# Patient Record
Sex: Male | Born: 1965 | Race: Black or African American | Hispanic: No | Marital: Married | Smoking: Never smoker
Health system: Southern US, Community
[De-identification: ages and names within clinical notes are randomized; demographics above are authoritative.]

## PROBLEM LIST (undated history)

## (undated) DIAGNOSIS — E785 Hyperlipidemia, unspecified: Secondary | ICD-10-CM

## (undated) DIAGNOSIS — I10 Essential (primary) hypertension: Secondary | ICD-10-CM

---

## 1999-08-19 ENCOUNTER — Encounter: Payer: Self-pay | Admitting: Endocrinology

## 1999-08-19 ENCOUNTER — Emergency Department (HOSPITAL_COMMUNITY): Admission: EM | Admit: 1999-08-19 | Discharge: 1999-08-19 | Payer: Self-pay | Admitting: Endocrinology

## 1999-11-20 ENCOUNTER — Ambulatory Visit (HOSPITAL_COMMUNITY): Admission: RE | Admit: 1999-11-20 | Discharge: 1999-11-20 | Payer: Self-pay | Admitting: Urology

## 2011-07-05 ENCOUNTER — Emergency Department (HOSPITAL_BASED_OUTPATIENT_CLINIC_OR_DEPARTMENT_OTHER)
Admission: EM | Admit: 2011-07-05 | Discharge: 2011-07-05 | Disposition: A | Discharge status: Home / Self Care | Payer: Worker's Compensation | Attending: Emergency Medicine | Admitting: Emergency Medicine

## 2011-07-05 ENCOUNTER — Emergency Department (INDEPENDENT_AMBULATORY_CARE_PROVIDER_SITE_OTHER): Payer: Worker's Compensation

## 2011-07-05 ENCOUNTER — Encounter: Payer: Self-pay | Admitting: *Deleted

## 2011-07-05 DIAGNOSIS — X500XXA Overexertion from strenuous movement or load, initial encounter: Secondary | ICD-10-CM | POA: Insufficient documentation

## 2011-07-05 DIAGNOSIS — M79609 Pain in unspecified limb: Secondary | ICD-10-CM

## 2011-07-05 DIAGNOSIS — X58XXXA Exposure to other specified factors, initial encounter: Secondary | ICD-10-CM

## 2011-07-05 DIAGNOSIS — E785 Hyperlipidemia, unspecified: Secondary | ICD-10-CM | POA: Insufficient documentation

## 2011-07-05 DIAGNOSIS — IMO0002 Reserved for concepts with insufficient information to code with codable children: Secondary | ICD-10-CM

## 2011-07-05 DIAGNOSIS — I10 Essential (primary) hypertension: Secondary | ICD-10-CM | POA: Insufficient documentation

## 2011-07-05 DIAGNOSIS — S8990XA Unspecified injury of unspecified lower leg, initial encounter: Secondary | ICD-10-CM

## 2011-07-05 DIAGNOSIS — S93699A Other sprain of unspecified foot, initial encounter: Secondary | ICD-10-CM | POA: Insufficient documentation

## 2011-07-05 DIAGNOSIS — Y9289 Other specified places as the place of occurrence of the external cause: Secondary | ICD-10-CM | POA: Insufficient documentation

## 2011-07-05 HISTORY — DX: Hyperlipidemia, unspecified: E78.5

## 2011-07-05 HISTORY — DX: Essential (primary) hypertension: I10

## 2011-07-05 MED ORDER — CYCLOBENZAPRINE HCL 10 MG PO TABS: 10.0000 mg | ORAL_TABLET | Freq: Two times a day (BID) | ORAL | Status: AC | PRN

## 2011-07-05 MED ORDER — IBUPROFEN 800 MG PO TABS: 800.0000 mg | ORAL_TABLET | Freq: Three times a day (TID) | ORAL | Status: AC

## 2011-07-05 NOTE — ED Notes (Signed)
Pt sts he was seen at [facility] and UDS was performed at that facility. Pt sts he just needs "an x-ray and evaluated".

## 2011-07-05 NOTE — ED Notes (Signed)
Pt denies offer of needing pain meds. States he is comfortable at this time. Pending radiology results. Will continue to monitor.

## 2011-07-05 NOTE — ED Notes (Signed)
Pt c/o left foot pain after injury this am.

## 2011-07-05 NOTE — ED Provider Notes (Signed)
History     Chief Complaint  Patient presents with  . Foot Injury   Patient is a 45 y.o. male presenting with foot injury. The history is provided by the patient.  Foot Injury  Incident onset: this morning. The incident occurred at work. The injury mechanism was torsion. The pain is present in the left foot (medial foot under 1st MTP). The quality of the pain is described as aching. The pain is moderate. The pain has been constant since onset. Pertinent negatives include no numbness, no inability to bear weight, no loss of motion, no muscle weakness, no loss of sensation and no tingling. The symptoms are aggravated by bearing weight. He has tried nothing for the symptoms.    Past Medical History  Diagnosis Date  . Hyperlipemia   . Hypertension     History reviewed. No pertinent past surgical history.  History reviewed. No pertinent family history.  History  Substance Use Topics  . Smoking status: Never Smoker   . Smokeless tobacco: Not on file  . Alcohol Use: No      Review of Systems  Musculoskeletal:       Positive for foot pain, Negative for swelling, or redness  Skin: Negative for color change.  Neurological: Negative for tingling and numbness.  All other systems reviewed and are negative.    Physical Exam  BP 127/93  Pulse 66  Temp(Src) 97.9 F (36.6 C) (Oral)  Resp 16  Wt 190 lb (86.183 kg)  SpO2 100%  Physical Exam  Constitutional: He is oriented to person, place, and time. He appears well-developed and well-nourished.  HENT:  Head: Normocephalic and atraumatic.  Eyes: Pupils are equal, round, and reactive to light.  Musculoskeletal:       Left ankle: Normal. no tenderness. No lateral malleolus and no medial malleolus tenderness found.       Left foot under 1st MTP TTP. No obvious deformity, erythema or edema. NML cap refill. Full ROM and toes and afoot. Normal tendon function and strength of toes.  Neurological: He is alert and oriented to person,  place, and time.  Skin: Skin is warm and dry. No rash noted. No erythema. No pallor.  Psychiatric: He has a normal mood and affect. His behavior is normal.    ED Course  Procedures  MDM       Thomasene Lot, [place] 07/05/11 2030

## 2011-09-01 NOTE — ED Provider Notes (Signed)
Medical screening examination/treatment/procedure(s) were performed by non-physician practitioner and as supervising physician I was immediately available for consultation/collaboration.  Doug Sou, MD 09/01/11 2025

## 2019-09-27 ENCOUNTER — Emergency Department (HOSPITAL_BASED_OUTPATIENT_CLINIC_OR_DEPARTMENT_OTHER)
Admission: EM | Admit: 2019-09-27 | Discharge: 2019-09-27 | Disposition: A | Discharge status: Home / Self Care | Payer: 59 | Attending: Emergency Medicine | Admitting: Emergency Medicine

## 2019-09-27 ENCOUNTER — Encounter (HOSPITAL_BASED_OUTPATIENT_CLINIC_OR_DEPARTMENT_OTHER): Payer: Self-pay | Admitting: Emergency Medicine

## 2019-09-27 ENCOUNTER — Other Ambulatory Visit: Payer: Self-pay

## 2019-09-27 DIAGNOSIS — F419 Anxiety disorder, unspecified: Secondary | ICD-10-CM | POA: Diagnosis not present

## 2019-09-27 DIAGNOSIS — I10 Essential (primary) hypertension: Secondary | ICD-10-CM | POA: Diagnosis not present

## 2019-09-27 NOTE — ED Provider Notes (Signed)
[facility] EMERGENCY DEPARTMENT Provider Note   CSN: [id number] Arrival date & time: 09/27/19  4098     History   Chief Complaint Chief Complaint  Patient presents with  . Anxiety    HPI Vincent Thompson is a 53 y.o. male.     HPI   53 year old male with what he calls anxiety.  Onset today while in his car driving to work.  Began to feel anxious and lightheaded.  He was able to pull his car over.  When he started driving again he had recurrence of the symptoms and then came to the emergency room.  He states that he has been having these symptoms intermittently over the past weeks to months.  His father did die approximately 3 weeks ago but his symptoms preceded this.  He denies any new medications.  Denies any drug use.  No new over-the-counter medications, supplements or heavy caffeine usage.  He is not sure of what may be driving the symptoms.  Denies any acute pain during this episode or previous ones.  No dyspnea.  No palpitations.  Past Medical History:  Diagnosis Date  . Hyperlipemia   . Hypertension     There are no active problems to display for this patient.   History reviewed. No pertinent surgical history.      Home Medications    Prior to Admission medications   Medication Sig Start Date End Date Taking? Authorizing Provider  amLODipine-benazepril (LOTREL) 5-10 MG per capsule Take 1 capsule by mouth daily.     [provider]  simvastatin (ZOCOR) 10 MG tablet Take 10 mg by mouth every morning.     [provider]    Family History No family history on file.  Social History Social History   Tobacco Use  . Smoking status: Never Smoker  Substance Use Topics  . Alcohol use: No  . Drug use: No     Allergies   Patient has no known allergies.   Review of Systems Review of Systems  All systems reviewed and negative, other than as noted in HPI.  Physical Exam Updated Vital Signs BP (!) 141/97 (BP Location: Right  Arm)   Pulse (!) 106   Temp 99 F (37.2 C) (Oral)   Resp 20   Ht 5\' 10"  (1.778 m)   Wt 88.5 kg   SpO2 98%   BMI 27.98 kg/m   Physical Exam Vitals signs and nursing note reviewed.  Constitutional:      General: He is not in acute distress.    Appearance: He is well-developed.  HENT:     Head: Normocephalic and atraumatic.  Eyes:     General:        Right eye: No discharge.        Left eye: No discharge.     Conjunctiva/sclera: Conjunctivae normal.  Neck:     Musculoskeletal: Neck supple.  Cardiovascular:     Rate and Rhythm: Regular rhythm. Tachycardia present.     Heart sounds: Normal heart sounds. No murmur. No friction rub. No gallop.   Pulmonary:     Effort: Pulmonary effort is normal. No respiratory distress.     Breath sounds: Normal breath sounds.  Abdominal:     General: There is no distension.     Palpations: Abdomen is soft.     Tenderness: There is no abdominal tenderness.  Musculoskeletal:        General: No tenderness.     Comments: Lower extremities symmetric  as compared to each other. No calf tenderness. Negative Homan's. No palpable cords.   Skin:    General: Skin is warm and dry.  Neurological:     Mental Status: He is alert.  Psychiatric:        Behavior: Behavior normal.        Thought Content: Thought content normal.      ED Treatments / Results  Labs (all labs ordered are listed, but only abnormal results are displayed) Labs Reviewed - No data to display  EKG EKG Interpretation  Date/Time:  Monday September 27 2019 09:01:26 EDT Ventricular Rate:  104 PR Interval:    QRS Duration: 92 QT Interval:  340 QTC Calculation: 448 R Axis:   69 Text Interpretation:  Sinus tachycardia Non-specific ST-t changes Confirmed by Raeford Razor [phone]) on 09/27/2019 9:09:46 AM   Radiology No results found.  Procedures Procedures (including critical care time)  Medications Ordered in ED Medications - No data to display   Initial Impression /  Assessment and Plan / ED Course  I have reviewed the triage vital signs and the nursing notes.  Pertinent labs & imaging results that were available during my care of the patient were reviewed by me and considered in my medical decision making (see chart for details).        53 year old male with what he is calling anxiety while driving to work this morning.  Now resolved.  Since symptoms may be from this.  He does seem to have the symptoms more frequently while driving to work in the morning.  He has not noticed any appreciable exacerbating relieving factors otherwise.  He has a mild resting tachycardia of unclear significance.  No pain.  No dyspnea.  No signs or symptoms of DVT.  At this time, I have a low suspicion for emergent underlying etiology.  If his symptoms persist or worsen though I feel that further evaluation by PCP is warranted.  Return precautions were discussed.  Work note for today was provided.  Final Clinical Impressions(s) / ED Diagnoses   Final diagnoses:  Anxiety    ED Discharge Orders    None       Raeford Razor, MD 09/27/19 [phone]

## 2019-09-27 NOTE — ED Triage Notes (Signed)
PT presents with c/o of anxiety for the month but had anxiety attack this am while driving .

## 2020-07-31 ENCOUNTER — Other Ambulatory Visit: Payer: Self-pay

## 2020-07-31 ENCOUNTER — Encounter (HOSPITAL_BASED_OUTPATIENT_CLINIC_OR_DEPARTMENT_OTHER): Payer: Self-pay | Admitting: *Deleted

## 2020-07-31 ENCOUNTER — Emergency Department (HOSPITAL_BASED_OUTPATIENT_CLINIC_OR_DEPARTMENT_OTHER)
Admission: EM | Admit: 2020-07-31 | Discharge: 2020-07-31 | Disposition: A | Discharge status: Home / Self Care | Payer: 59 | Attending: Emergency Medicine | Admitting: Emergency Medicine

## 2020-07-31 ENCOUNTER — Emergency Department (HOSPITAL_BASED_OUTPATIENT_CLINIC_OR_DEPARTMENT_OTHER): Payer: 59

## 2020-07-31 DIAGNOSIS — M25552 Pain in left hip: Secondary | ICD-10-CM | POA: Diagnosis not present

## 2020-07-31 DIAGNOSIS — R0781 Pleurodynia: Secondary | ICD-10-CM | POA: Diagnosis not present

## 2020-07-31 DIAGNOSIS — W11XXXA Fall on and from ladder, initial encounter: Secondary | ICD-10-CM | POA: Insufficient documentation

## 2020-07-31 DIAGNOSIS — Y9289 Other specified places as the place of occurrence of the external cause: Secondary | ICD-10-CM | POA: Insufficient documentation

## 2020-07-31 DIAGNOSIS — Y9389 Activity, other specified: Secondary | ICD-10-CM | POA: Diagnosis not present

## 2020-07-31 DIAGNOSIS — Z79899 Other long term (current) drug therapy: Secondary | ICD-10-CM | POA: Diagnosis not present

## 2020-07-31 DIAGNOSIS — Y998 Other external cause status: Secondary | ICD-10-CM | POA: Insufficient documentation

## 2020-07-31 DIAGNOSIS — I10 Essential (primary) hypertension: Secondary | ICD-10-CM | POA: Diagnosis not present

## 2020-07-31 DIAGNOSIS — W19XXXA Unspecified fall, initial encounter: Secondary | ICD-10-CM

## 2020-07-31 NOTE — Discharge Instructions (Signed)
Your x-ray today did not show any fractures.  Use the incentive spirometer every hour while you are awake as we discussed.  This will help you take deep breaths  You can also buy over-the-counter Salonpas patches.  Apply these to your ribs to help with pain.  10 you Tylenol and ibuprofen as needed for pain.  Take as directed on the bottles.

## 2020-07-31 NOTE — ED Provider Notes (Signed)
[facility] EMERGENCY DEPARTMENT Provider Note   CSN: [id number] Arrival date & time: 07/31/20  1323     History Chief Complaint  Patient presents with   Vincent Thompson is a 54 y.o. male with past medical history sniffing for hyperlipidemia and hypertension.  He is not anticoagulated.  HPI Patient presents to emergency department today with chief complaint of mechanical fall x3 days ago.  Patient states he was standing on a ladder that he thought was well secured to the ledge he was working on.  Unfortunately it was not and he fell.  He estimates falling approximately 8 feet.  He states he landed facedown however denies hitting his head or loss of consciousness.  He was able to get up without assistance and has been ambulatory since the fall without difficulty.  He is endorsing pain on the left lower ribs and left elbow.  He states the pain is dull aching sensation.  He states the pain is constant and is worse with movement.  He rates the pain 8 out of 10 in severity.  He has been taking ibuprofen with symptom improvement.  Denies any pain with taking a deep breath.  He denies any headache, visual changes, neck pain, fever, chills, cough, difficulty breathing, chest pain, back pain, numbness, tingling or weakness.       Past Medical History:  Diagnosis Date   Hyperlipemia    Hypertension     There are no problems to display for this patient.   History reviewed. No pertinent surgical history.     No family history on file.  Social History   Tobacco Use   Smoking status: Never Smoker   Smokeless tobacco: Never Used  Substance Use Topics   Alcohol use: No   Drug use: No    Home Medications Prior to Admission medications   Medication Sig Start Date End Date Taking? Authorizing Provider  amLODipine-benazepril (LOTREL) 5-10 MG per capsule Take 1 capsule by mouth daily.    Yes [provider]  simvastatin (ZOCOR) 10 MG tablet Take 10 mg  by mouth every morning.    Yes [provider]    Allergies    Patient has no known allergies.  Review of Systems   Review of Systems All other systems are reviewed and are negative for acute change except as noted in the HPI.  Physical Exam Updated Vital Signs BP (!) 153/111    Pulse 96    Temp 99.1 F (37.3 C) (Oral)    Resp 18    Ht 5\' 10"  (1.778 m)    Wt 88.5 kg    SpO2 97%    BMI 27.99 kg/m   Physical Exam Vitals and nursing note reviewed.  Constitutional:      General: He is not in acute distress.    Appearance: He is not ill-appearing.  HENT:     Head: Normocephalic and atraumatic. No raccoon eyes or Battle's sign.     Jaw: There is normal jaw occlusion.     Comments: No tenderness to palpation of skull. No deformities or crepitus noted. No open wounds, abrasions or lacerations.     Right Ear: Tympanic membrane and external ear normal. No hemotympanum.     Left Ear: Tympanic membrane and external ear normal. No hemotympanum.     Nose: Nose normal.     Mouth/Throat:     Mouth: Mucous membranes are moist.     Pharynx: Oropharynx is clear.  Eyes:     General: No scleral icterus.       Right eye: No discharge.        Left eye: No discharge.     Extraocular Movements: Extraocular movements intact.     Conjunctiva/sclera: Conjunctivae normal.     Pupils: Pupils are equal, round, and reactive to light.  Neck:     Vascular: No JVD.     Comments: Full ROM intact without spinous process TTP. No bony stepoffs or deformities, no paraspinous muscle TTP or muscle spasms. No rigidity or meningeal signs. No bruising, erythema, or swelling.  Cardiovascular:     Rate and Rhythm: Normal rate and regular rhythm.     Pulses: Normal pulses.          Radial pulses are 2+ on the right side and 2+ on the left side.     Heart sounds: Normal heart sounds.  Pulmonary:     Comments: Lungs clear to auscultation in all fields. Symmetric chest rise. No wheezing, rales, or rhonchi.   Oxygen saturation is 97% on room air. Chest:       Comments: Tenderness palpation as depicted image above.  No overlying skin changes.  No crepitus, deformity, no flail chest.   Abdominal:     Comments: Abdomen is soft, non-distended, and non-tender in all quadrants. No rigidity, no guarding. No peritoneal signs.  Musculoskeletal:        General: Normal range of motion.     Left elbow: Normal.     Left forearm: Normal.     Left wrist: Normal.     Cervical back: Normal and normal range of motion.     Thoracic back: Normal.     Lumbar back: Normal.     Comments: Compartments in left upper extremity are soft.  Radial pulse 2+ bilaterally. Equal grip strength in bilateral upper extremities.  Full ROM of left shoulder, elbow, and wrist. Palpated from head to toe without any bony tenderness. Pelvis is stable.  Skin:    General: Skin is warm and dry.     Capillary Refill: Capillary refill takes less than 2 seconds.  Neurological:     Mental Status: He is oriented to person, place, and time.     GCS: GCS eye subscore is 4. GCS verbal subscore is 5. GCS motor subscore is 6.     Comments: Fluent speech, no facial droop.  Psychiatric:        Behavior: Behavior normal.       ED Results / Procedures / Treatments   Labs (all labs ordered are listed, but only abnormal results are displayed) Labs Reviewed - No data to display  EKG None  Radiology DG Ribs Unilateral W/Chest Left  Result Date: 07/31/2020 CLINICAL DATA:  Left lower anterior rib pain status post fall. EXAM: LEFT RIBS AND CHEST - 3+ VIEW COMPARISON:  None. FINDINGS: No fracture or other bone lesions are seen involving the ribs. There is no evidence of pneumothorax or pleural effusion. Both lungs are clear. Heart size and mediastinal contours are within normal limits. IMPRESSION: Negative. Electronically Signed   By: Elige Ko   On: 07/31/2020 14:31   DG Elbow Complete Left  Result Date: 07/31/2020 CLINICAL DATA:  Left  elbow injury in a fall off a ledge which broke today. Initial encounter. EXAM: LEFT ELBOW - COMPLETE 3+ VIEW COMPARISON:  None. FINDINGS: There is no evidence of fracture, dislocation, or joint effusion. There is no evidence of arthropathy or other focal  bone abnormality. Soft tissues are unremarkable. IMPRESSION: Negative exam. Electronically Signed   By: Drusilla Kanner M.D.   On: 07/31/2020 14:30    Procedures Procedures (including critical care time)  Medications Ordered in ED Medications - No data to display  ED Course  I have reviewed the triage vital signs and the nursing notes.  Pertinent labs & imaging results that were available during my care of the patient were reviewed by me and considered in my medical decision making (see chart for details).    MDM Rules/Calculators/A&P                          History provided by patient with additional history obtained from chart review.     54 yo male presents after mechanical fall x3 days ago. Head to toe evaluation shows no hematoma, bleeding of the scalp, no facial abrasions, no spine step offs, crepitus of the chest or neck, no tenderness to palpation of the bilateral upper and lower extremities, no gross deformities. no pelvic pain.  He does have tenderness palpation of anterior left lower ribs.  No crepitus, deformity or flail chest.  Lung sounds heard in all fields.  Oxygen saturation 97% room air.  Left upper extremity including elbow also unremarkable on exam. X-ray of chest with left ribs and of left elbow viewed by me not show any fractures or dislocations.  No obvious x-ray of ribs seen.  Discussed possibility of occult fracture with patient.  Given incentive spirometer and instructed on use every hour while awake to encourage deep breathing.  Also recommend over-the-counter lidocaine patches to help with pain.  Given reassuring exam do not feel further imaging is needed.  Engaged in shared decision making and patient  agrees.  The patient appears reasonably screened and/or stabilized for discharge and I doubt any other medical condition or other [facility] requiring further screening, evaluation, or treatment in the ED at this time prior to discharge. The patient is safe for discharge with strict return precautions discussed. Recommend pcp follow up if pain persists.   Portions of this note were generated with Scientist, clinical (histocompatibility and immunogenetics). Dictation errors may occur despite best attempts at proofreading.    Final Clinical Impression(s) / ED Diagnoses Final diagnoses:  Fall, initial encounter    Rx / DC Orders ED Discharge Orders    None       Sherene Sires, PA-C 07/31/20 1611    Milagros Loll, MD 08/04/20 1441

## 2020-07-31 NOTE — ED Notes (Signed)
Pt provided instructions on incentive spirometry use with return demonstration.

## 2020-07-31 NOTE — ED Triage Notes (Signed)
Left ribs and left elbow injury. He was cleaning while standing on a ledge and the ledge broke.

## 2021-02-08 IMAGING — CR DG RIBS W/ CHEST 3+V*L*
4 series · 4 of 4 positions shown · non-contrast
Comparison: None.

CLINICAL DATA: Left lower anterior rib pain status post fall.

EXAM:
LEFT RIBS AND CHEST - 3+ VIEW

[w chest pa]
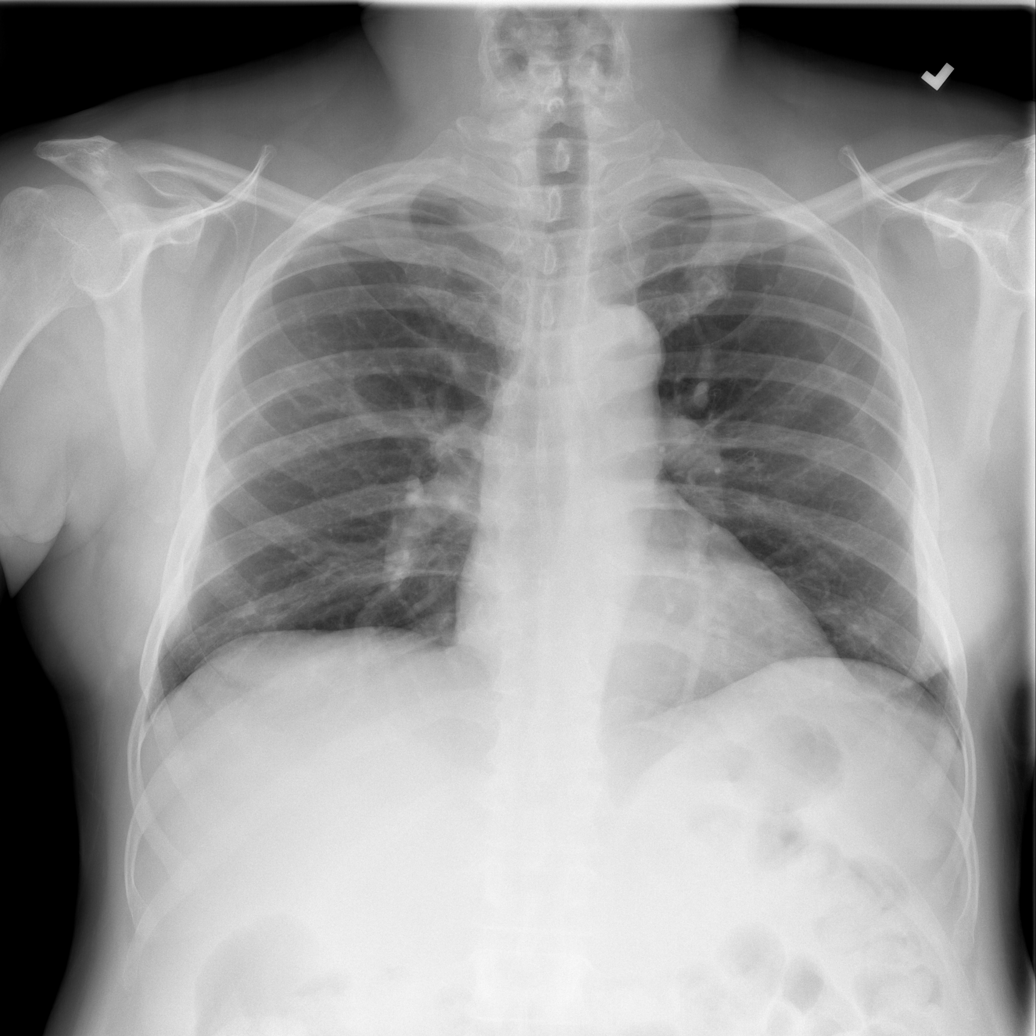

[w ribs ap/pa upper left]
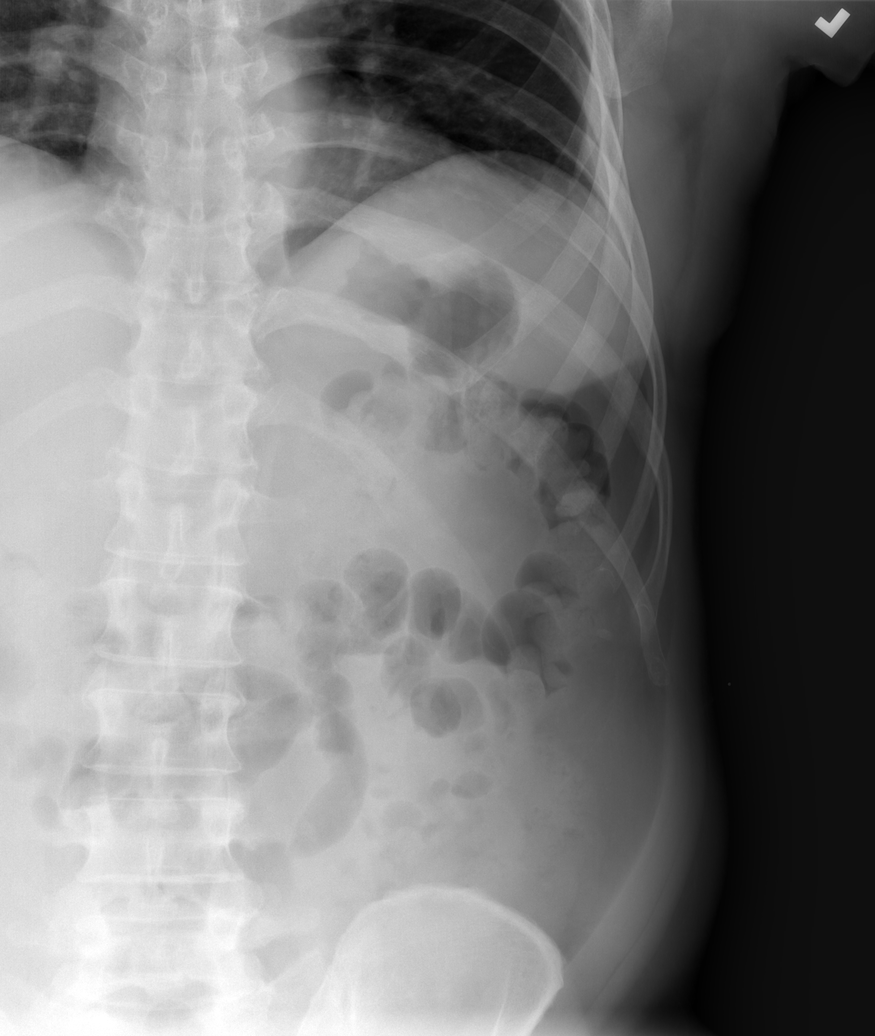

[w ribs oblique left (1 of 2)]
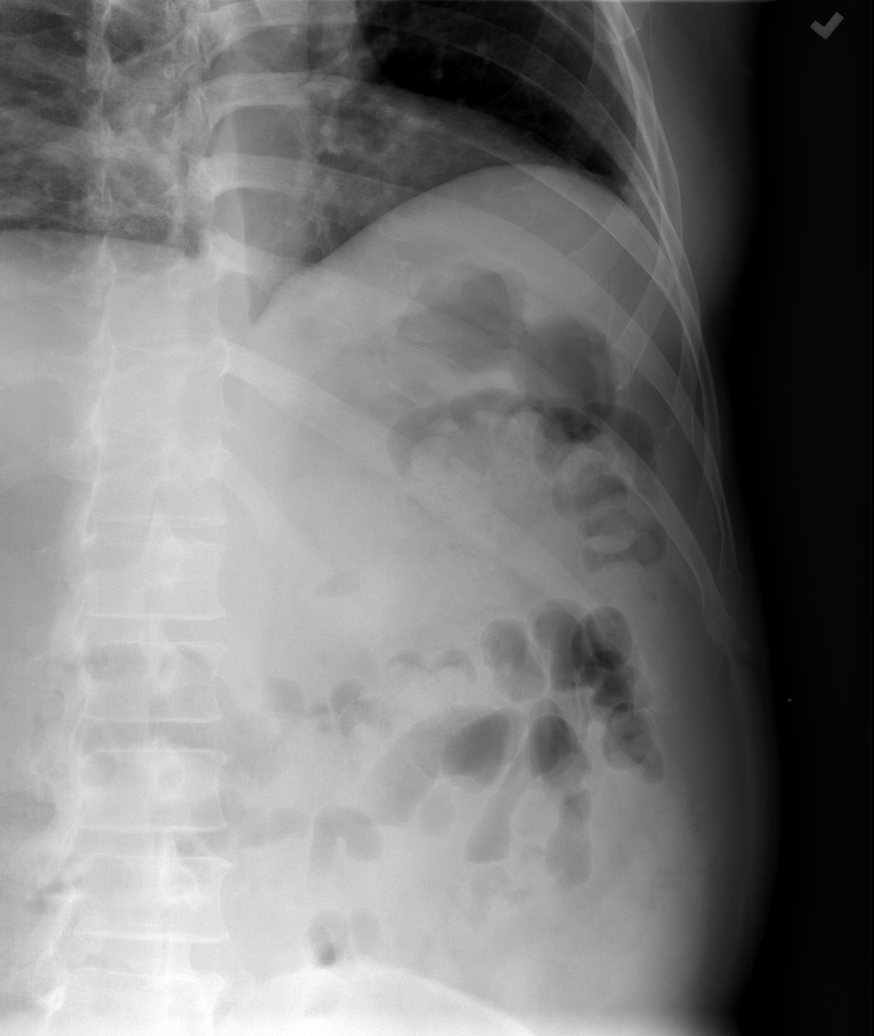

[w ribs oblique left (2 of 2)]
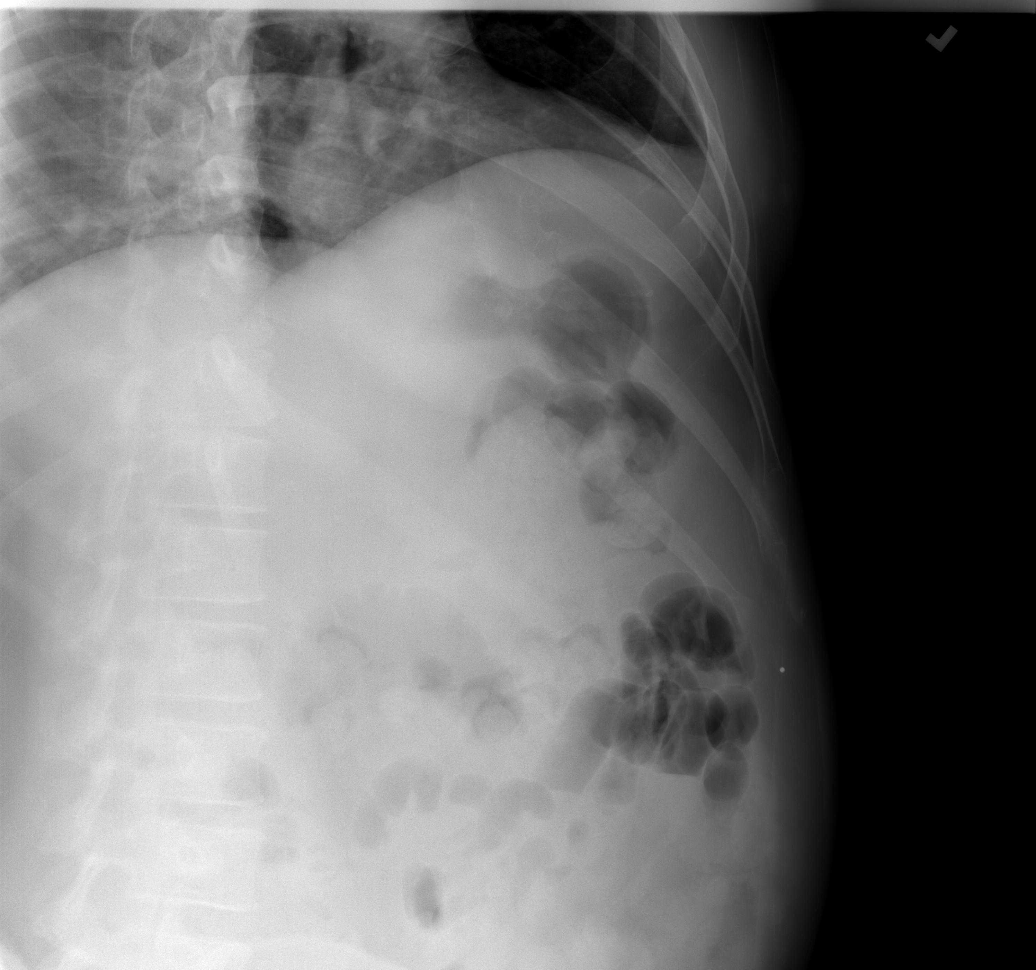

[4 of 4 positions shown; findings below may reference images not displayed]

FINDINGS: No fracture or other bone lesions are seen involving the ribs. There
is no evidence of pneumothorax or pleural effusion. Both lungs are
clear. Heart size and mediastinal contours are within normal limits.
IMPRESSION: Negative.

## 2023-01-12 ENCOUNTER — Inpatient Hospital Stay (HOSPITAL_BASED_OUTPATIENT_CLINIC_OR_DEPARTMENT_OTHER)
Admission: EM | Admit: 2023-01-12 | Discharge: 2023-01-22 | DRG: 637 | Disposition: A | Discharge status: Home / Self Care | Payer: 59 | Attending: Internal Medicine | Admitting: Internal Medicine

## 2023-01-12 ENCOUNTER — Emergency Department (HOSPITAL_BASED_OUTPATIENT_CLINIC_OR_DEPARTMENT_OTHER): Payer: 59

## 2023-01-12 ENCOUNTER — Encounter (HOSPITAL_BASED_OUTPATIENT_CLINIC_OR_DEPARTMENT_OTHER): Payer: Self-pay | Admitting: Emergency Medicine

## 2023-01-12 ENCOUNTER — Other Ambulatory Visit: Payer: Self-pay

## 2023-01-12 ENCOUNTER — Encounter (HOSPITAL_COMMUNITY): Payer: Self-pay

## 2023-01-12 DIAGNOSIS — R4182 Altered mental status, unspecified: Secondary | ICD-10-CM | POA: Diagnosis present

## 2023-01-12 DIAGNOSIS — R6510 Systemic inflammatory response syndrome (SIRS) of non-infectious origin without acute organ dysfunction: Secondary | ICD-10-CM | POA: Diagnosis present

## 2023-01-12 DIAGNOSIS — I10 Essential (primary) hypertension: Secondary | ICD-10-CM | POA: Diagnosis present

## 2023-01-12 DIAGNOSIS — F10231 Alcohol dependence with withdrawal delirium: Secondary | ICD-10-CM | POA: Diagnosis present

## 2023-01-12 DIAGNOSIS — E861 Hypovolemia: Secondary | ICD-10-CM | POA: Diagnosis present

## 2023-01-12 DIAGNOSIS — D539 Nutritional anemia, unspecified: Secondary | ICD-10-CM | POA: Diagnosis present

## 2023-01-12 DIAGNOSIS — E785 Hyperlipidemia, unspecified: Secondary | ICD-10-CM | POA: Diagnosis present

## 2023-01-12 DIAGNOSIS — K567 Ileus, unspecified: Secondary | ICD-10-CM | POA: Diagnosis present

## 2023-01-12 DIAGNOSIS — N179 Acute kidney failure, unspecified: Secondary | ICD-10-CM | POA: Diagnosis not present

## 2023-01-12 DIAGNOSIS — K7010 Alcoholic hepatitis without ascites: Secondary | ICD-10-CM | POA: Diagnosis present

## 2023-01-12 DIAGNOSIS — D696 Thrombocytopenia, unspecified: Secondary | ICD-10-CM | POA: Diagnosis not present

## 2023-01-12 DIAGNOSIS — K700 Alcoholic fatty liver: Secondary | ICD-10-CM | POA: Diagnosis present

## 2023-01-12 DIAGNOSIS — R000 Tachycardia, unspecified: Secondary | ICD-10-CM | POA: Diagnosis present

## 2023-01-12 DIAGNOSIS — F419 Anxiety disorder, unspecified: Secondary | ICD-10-CM | POA: Diagnosis present

## 2023-01-12 DIAGNOSIS — E871 Hypo-osmolality and hyponatremia: Secondary | ICD-10-CM | POA: Diagnosis present

## 2023-01-12 DIAGNOSIS — G9341 Metabolic encephalopathy: Secondary | ICD-10-CM | POA: Diagnosis present

## 2023-01-12 DIAGNOSIS — E870 Hyperosmolality and hypernatremia: Secondary | ICD-10-CM | POA: Diagnosis present

## 2023-01-12 DIAGNOSIS — N281 Cyst of kidney, acquired: Secondary | ICD-10-CM | POA: Diagnosis present

## 2023-01-12 DIAGNOSIS — K2980 Duodenitis without bleeding: Secondary | ICD-10-CM | POA: Diagnosis present

## 2023-01-12 DIAGNOSIS — G312 Degeneration of nervous system due to alcohol: Secondary | ICD-10-CM | POA: Diagnosis present

## 2023-01-12 DIAGNOSIS — E860 Dehydration: Secondary | ICD-10-CM | POA: Diagnosis present

## 2023-01-12 DIAGNOSIS — M6282 Rhabdomyolysis: Secondary | ICD-10-CM | POA: Diagnosis not present

## 2023-01-12 DIAGNOSIS — K859 Acute pancreatitis without necrosis or infection, unspecified: Secondary | ICD-10-CM | POA: Diagnosis not present

## 2023-01-12 DIAGNOSIS — K56600 Partial intestinal obstruction, unspecified as to cause: Secondary | ICD-10-CM | POA: Diagnosis present

## 2023-01-12 DIAGNOSIS — K858 Other acute pancreatitis without necrosis or infection: Secondary | ICD-10-CM | POA: Diagnosis present

## 2023-01-12 DIAGNOSIS — E1110 Type 2 diabetes mellitus with ketoacidosis without coma: Principal | ICD-10-CM | POA: Diagnosis present

## 2023-01-12 DIAGNOSIS — R410 Disorientation, unspecified: Secondary | ICD-10-CM

## 2023-01-12 DIAGNOSIS — Z79899 Other long term (current) drug therapy: Secondary | ICD-10-CM

## 2023-01-12 DIAGNOSIS — J069 Acute upper respiratory infection, unspecified: Secondary | ICD-10-CM | POA: Diagnosis present

## 2023-01-12 DIAGNOSIS — R7989 Other specified abnormal findings of blood chemistry: Secondary | ICD-10-CM | POA: Diagnosis not present

## 2023-01-12 DIAGNOSIS — E876 Hypokalemia: Secondary | ICD-10-CM | POA: Diagnosis present

## 2023-01-12 DIAGNOSIS — Z1152 Encounter for screening for COVID-19: Secondary | ICD-10-CM

## 2023-01-12 DIAGNOSIS — G4733 Obstructive sleep apnea (adult) (pediatric): Secondary | ICD-10-CM | POA: Diagnosis present

## 2023-01-12 DIAGNOSIS — Z833 Family history of diabetes mellitus: Secondary | ICD-10-CM

## 2023-01-12 LAB — COMPREHENSIVE METABOLIC PANEL
ALT: 96 U/L — ABNORMAL HIGH (ref 0–44)
AST: 66 U/L — ABNORMAL HIGH (ref 15–41)
Albumin: 4.6 g/dL (ref 3.5–5.0)
Alkaline Phosphatase: 76 U/L (ref 38–126)
Anion gap: 33 — ABNORMAL HIGH (ref 5–15)
BUN: 90 mg/dL — ABNORMAL HIGH (ref 6–20)
CO2: 13 mmol/L — ABNORMAL LOW (ref 22–32)
Calcium: 12.2 mg/dL — ABNORMAL HIGH (ref 8.9–10.3)
Chloride: 87 mmol/L — ABNORMAL LOW (ref 98–111)
Creatinine, Ser: 3.61 mg/dL — ABNORMAL HIGH (ref 0.61–1.24)
GFR, Estimated: 19 mL/min — ABNORMAL LOW (ref >60)
Glucose, Bld: >1200 mg/dL (ref 70–99)
Potassium: 5.4 mmol/L — ABNORMAL HIGH (ref 3.5–5.1)
Sodium: 133 mmol/L — ABNORMAL LOW (ref 135–145)
Total Bilirubin: 1.8 mg/dL — ABNORMAL HIGH (ref 0.3–1.2)
Total Protein: 9.7 g/dL — ABNORMAL HIGH (ref 6.5–8.1)

## 2023-01-12 LAB — I-STAT ARTERIAL BLOOD GAS, ED
Acid-base deficit: 9 mmol/L — ABNORMAL HIGH (ref 0.0–2.0)
Bicarbonate: 16.2 mmol/L — ABNORMAL LOW (ref 20.0–28.0)
Calcium, Ion: 1.56 mmol/L (ref 1.15–1.40)
HCT: 47 % (ref 39.0–52.0)
Hemoglobin: 16 g/dL (ref 13.0–17.0)
O2 Saturation: 92 %
Patient temperature: 99.1
Potassium: 4.4 mmol/L (ref 3.5–5.1)
Sodium: 137 mmol/L (ref 135–145)
TCO2: 17 mmol/L — ABNORMAL LOW (ref 22–32)
pCO2 arterial: 32.8 mmHg (ref 32–48)
pH, Arterial: 7.303 — ABNORMAL LOW (ref 7.35–7.45)
pO2, Arterial: 70 mmHg — ABNORMAL LOW (ref 83–108)

## 2023-01-12 LAB — URINALYSIS, MICROSCOPIC (REFLEX)

## 2023-01-12 LAB — I-STAT VENOUS BLOOD GAS, ED
Acid-base deficit: 11 mmol/L — ABNORMAL HIGH (ref 0.0–2.0)
Bicarbonate: 14.7 mmol/L — ABNORMAL LOW (ref 20.0–28.0)
Calcium, Ion: 1.43 mmol/L — ABNORMAL HIGH (ref 1.15–1.40)
HCT: 53 % — ABNORMAL HIGH (ref 39.0–52.0)
Hemoglobin: 18 g/dL — ABNORMAL HIGH (ref 13.0–17.0)
O2 Saturation: 75 %
Patient temperature: 100.2
Potassium: 5.4 mmol/L — ABNORMAL HIGH (ref 3.5–5.1)
Sodium: 134 mmol/L — ABNORMAL LOW (ref 135–145)
TCO2: 16 mmol/L — ABNORMAL LOW (ref 22–32)
pCO2, Ven: 32.7 mmHg — ABNORMAL LOW (ref 44–60)
pH, Ven: 7.264 (ref 7.25–7.43)
pO2, Ven: 48 mmHg — ABNORMAL HIGH (ref 32–45)

## 2023-01-12 LAB — CBG MONITORING, ED
Glucose-Capillary: >600 mg/dL (ref 70–99)
Glucose-Capillary: >600 mg/dL (ref 70–99)
Glucose-Capillary: >600 mg/dL (ref 70–99)
Glucose-Capillary: >600 mg/dL (ref 70–99)
Glucose-Capillary: >600 mg/dL (ref 70–99)
Glucose-Capillary: >600 mg/dL (ref 70–99)
Glucose-Capillary: >600 mg/dL (ref 70–99)
Glucose-Capillary: >600 mg/dL (ref 70–99)

## 2023-01-12 LAB — CBC WITH DIFFERENTIAL/PLATELET
Abs Immature Granulocytes: 0.15 10*3/uL — ABNORMAL HIGH (ref 0.00–0.07)
Basophils Absolute: 0.1 10*3/uL (ref 0.0–0.1)
Basophils Relative: 0 %
Eosinophils Absolute: 0 10*3/uL (ref 0.0–0.5)
Eosinophils Relative: 0 %
HCT: 53.4 % — ABNORMAL HIGH (ref 39.0–52.0)
Hemoglobin: 17 g/dL (ref 13.0–17.0)
Immature Granulocytes: 1 %
Lymphocytes Relative: 3 %
Lymphs Abs: 0.5 10*3/uL — ABNORMAL LOW (ref 0.7–4.0)
MCH: 31.5 pg (ref 26.0–34.0)
MCHC: 31.8 g/dL (ref 30.0–36.0)
MCV: 99.1 fL (ref 80.0–100.0)
Monocytes Absolute: 1.2 10*3/uL — ABNORMAL HIGH (ref 0.1–1.0)
Monocytes Relative: 6 %
Neutro Abs: 16.8 10*3/uL — ABNORMAL HIGH (ref 1.7–7.7)
Neutrophils Relative %: 90 %
Platelets: 325 10*3/uL (ref 150–400)
RBC: 5.39 MIL/uL (ref 4.22–5.81)
RDW: 12.6 % (ref 11.5–15.5)
WBC: 18.7 10*3/uL — ABNORMAL HIGH (ref 4.0–10.5)
nRBC: 0 % (ref 0.0–0.2)

## 2023-01-12 LAB — BASIC METABOLIC PANEL
Anion gap: 30 — ABNORMAL HIGH (ref 5–15)
Anion gap: 18 — ABNORMAL HIGH (ref 5–15)
BUN: 88 mg/dL — ABNORMAL HIGH (ref 6–20)
BUN: 76 mg/dL — ABNORMAL HIGH (ref 6–20)
CO2: 14 mmol/L — ABNORMAL LOW (ref 22–32)
CO2: 21 mmol/L — ABNORMAL LOW (ref 22–32)
Calcium: 11.3 mg/dL — ABNORMAL HIGH (ref 8.9–10.3)
Calcium: 11.6 mg/dL — ABNORMAL HIGH (ref 8.9–10.3)
Chloride: 91 mmol/L — ABNORMAL LOW (ref 98–111)
Chloride: 104 mmol/L (ref 98–111)
Creatinine, Ser: 3.37 mg/dL — ABNORMAL HIGH (ref 0.61–1.24)
Creatinine, Ser: 2.61 mg/dL — ABNORMAL HIGH (ref 0.61–1.24)
GFR, Estimated: 20 mL/min — ABNORMAL LOW (ref >60)
GFR, Estimated: 28 mL/min — ABNORMAL LOW (ref >60)
Glucose, Bld: >1200 mg/dL (ref 70–99)
Glucose, Bld: 955 mg/dL (ref 70–99)
Potassium: 6 mmol/L — ABNORMAL HIGH (ref 3.5–5.1)
Potassium: 4 mmol/L (ref 3.5–5.1)
Sodium: 135 mmol/L (ref 135–145)
Sodium: 143 mmol/L (ref 135–145)

## 2023-01-12 LAB — RAPID URINE DRUG SCREEN, HOSP PERFORMED
Amphetamines: NOT DETECTED
Barbiturates: NOT DETECTED
Benzodiazepines: NOT DETECTED
Cocaine: NOT DETECTED
Opiates: NOT DETECTED
Tetrahydrocannabinol: NOT DETECTED

## 2023-01-12 LAB — URINALYSIS, ROUTINE W REFLEX MICROSCOPIC
Bilirubin Urine: NEGATIVE
Glucose, UA: >=500 mg/dL — AB
Ketones, ur: 40 mg/dL — AB
Leukocytes,Ua: NEGATIVE
Nitrite: NEGATIVE
Protein, ur: NEGATIVE mg/dL
Specific Gravity, Urine: 1.01 (ref 1.005–1.030)
pH: 5 (ref 5.0–8.0)

## 2023-01-12 LAB — LACTIC ACID, PLASMA
Lactic Acid, Venous: 5.9 mmol/L (ref 0.5–1.9)
Lactic Acid, Venous: 6.8 mmol/L (ref 0.5–1.9)
Lactic Acid, Venous: 2.5 mmol/L (ref 0.5–1.9)

## 2023-01-12 LAB — RESP PANEL BY RT-PCR (RSV, FLU A&B, COVID)  RVPGX2
Influenza A by PCR: NEGATIVE
Influenza B by PCR: NEGATIVE
Resp Syncytial Virus by PCR: NEGATIVE
SARS Coronavirus 2 by RT PCR: NEGATIVE

## 2023-01-12 LAB — LIPASE, BLOOD: Lipase: 1194 U/L — ABNORMAL HIGH (ref 11–51)

## 2023-01-12 LAB — PROTIME-INR
INR: 1.3 — ABNORMAL HIGH (ref 0.8–1.2)
Prothrombin Time: 15.9 seconds — ABNORMAL HIGH (ref 11.4–15.2)

## 2023-01-12 LAB — GLUCOSE, CAPILLARY: Glucose-Capillary: >600 mg/dL (ref 70–99)

## 2023-01-12 LAB — APTT: aPTT: 28 seconds (ref 24–36)

## 2023-01-12 LAB — BETA-HYDROXYBUTYRIC ACID: Beta-Hydroxybutyric Acid: >8 mmol/L — ABNORMAL HIGH (ref 0.05–0.27)

## 2023-01-12 MED ORDER — VANCOMYCIN VARIABLE DOSE PER UNSTABLE RENAL FUNCTION (PHARMACIST DOSING)
Status: DC
  Filled 2023-01-12: qty 1

## 2023-01-12 MED ORDER — LACTATED RINGERS IV SOLN: INTRAVENOUS | Status: DC

## 2023-01-12 MED ORDER — DEXTROSE 50 % IV SOLN: 0.0000 mL | INTRAVENOUS | Status: DC | PRN

## 2023-01-12 MED ORDER — SODIUM CHLORIDE 0.9 % IV SOLN: INTRAVENOUS | Status: DC

## 2023-01-12 MED ORDER — INSULIN REGULAR(HUMAN) IN NACL 100-0.9 UT/100ML-% IV SOLN
INTRAVENOUS | Status: DC
  Administered 2023-01-13: 12 [IU]/h via INTRAVENOUS
  Administered 2023-01-13: 9 [IU]/h via INTRAVENOUS
  Administered 2023-01-13: 8.5 [IU]/h via INTRAVENOUS
  Administered 2023-01-14: 21 [IU]/h via INTRAVENOUS
  Administered 2023-01-14: 8.5 [IU]/h via INTRAVENOUS
  Administered 2023-01-15: 2.4 [IU]/h via INTRAVENOUS
  Administered 2023-01-16 (×2): 4.6 [IU]/h via INTRAVENOUS
  Administered 2023-01-18: 1.9 [IU]/h via INTRAVENOUS
  Filled 2023-01-12 (×9): qty 100

## 2023-01-12 MED ORDER — METRONIDAZOLE 500 MG/100ML IV SOLN
500.0000 mg | Freq: Two times a day (BID) | INTRAVENOUS | Status: DC
  Administered 2023-01-13 – 2023-01-15 (×5): 500 mg via INTRAVENOUS
  Filled 2023-01-12 (×5): qty 100

## 2023-01-12 MED ORDER — SODIUM CHLORIDE 0.9 % IV BOLUS
1000.0000 mL | Freq: Once | INTRAVENOUS | Status: AC
  Administered 2023-01-12: 1000 mL via INTRAVENOUS

## 2023-01-12 MED ORDER — VANCOMYCIN HCL IN DEXTROSE 1-5 GM/200ML-% IV SOLN
1000.0000 mg | Freq: Once | INTRAVENOUS | Status: AC
  Administered 2023-01-12: 1000 mg via INTRAVENOUS
  Filled 2023-01-12: qty 200

## 2023-01-12 MED ORDER — LACTATED RINGERS IV BOLUS (SEPSIS)
1000.0000 mL | Freq: Once | INTRAVENOUS | Status: AC
  Administered 2023-01-12: 1000 mL via INTRAVENOUS

## 2023-01-12 MED ORDER — PIPERACILLIN-TAZOBACTAM 3.375 G IVPB 30 MIN
3.3750 g | Freq: Once | INTRAVENOUS | Status: AC
  Administered 2023-01-12: 3.375 g via INTRAVENOUS
  Filled 2023-01-12: qty 50

## 2023-01-12 MED ORDER — INSULIN REGULAR(HUMAN) IN NACL 100-0.9 UT/100ML-% IV SOLN
INTRAVENOUS | Status: DC
  Administered 2023-01-12: 4.6 [IU]/h via INTRAVENOUS
  Filled 2023-01-12: qty 100

## 2023-01-12 MED ORDER — ORAL CARE MOUTH RINSE
15.0000 mL | OROMUCOSAL | Status: DC | PRN
  Filled 2023-01-12: qty 15

## 2023-01-12 MED ORDER — VANCOMYCIN HCL 500 MG IV SOLR
500.0000 mg | Freq: Once | INTRAVENOUS | Status: AC
  Administered 2023-01-12: 500 mg via INTRAVENOUS

## 2023-01-12 MED ORDER — DEXTROSE IN LACTATED RINGERS 5 % IV SOLN: INTRAVENOUS | Status: DC

## 2023-01-12 MED ORDER — LACTATED RINGERS IV BOLUS
1000.0000 mL | Freq: Once | INTRAVENOUS | Status: AC
  Administered 2023-01-12: 1000 mL via INTRAVENOUS

## 2023-01-12 MED ORDER — CHLORHEXIDINE GLUCONATE CLOTH 2 % EX PADS
6.0000 | MEDICATED_PAD | Freq: Every day | CUTANEOUS | Status: DC
  Administered 2023-01-12 – 2023-01-22 (×11): 6 via TOPICAL
  Filled 2023-01-12: qty 6

## 2023-01-12 NOTE — ED Notes (Signed)
ED Provider at bedside. 

## 2023-01-12 NOTE — ED Notes (Signed)
ABG attempt left radial unsuccessful, patient taken to CT will attempt when returned to room.

## 2023-01-12 NOTE — ED Notes (Signed)
Called spouse, Berenice Primas, and updated that patient had left with [facility] and was on the way to [employer]

## 2023-01-12 NOTE — H&P (Addendum)
Vincent Thompson YDX:[id number] DOB: [birth year] DOA: 01/12/2023     PCP: Loyal Jacobson, MD   Outpatient Specialists:  NONE     Patient arrived to ER on 01/12/23 at 1402 Referred by Attending Therisa Doyne, MD   Patient coming from:    home Lives    With family    Chief Complaint:   Chief Complaint  Patient presents with   Altered Mental Status    HPI: Vincent Thompson is a 57 y.o. male with medical history significant of HTN, elevated LFT, anxiety, HLD    Presented with   confusion and lethargy Reports cough and URI for 2 wks Today lethargic, tested negative for COVID 1 wk ago endorses confusion. At first he was getting a bit better yesterday but then started to get again more lethargic and tired.  That is what triggered him to go to emergency department.  Otherwise wife is unsure about any abdominal pain or dysuria he still has some cough.  Found to have severely elevated blood sugar and lipase Patient was not aware that he is diabetic  Wife states he stopped urinating but before that he was drinking a lot of fluids He drinks on occasion 3-5 drinks a week  Does not smoke He was told that he is prediabetic Family hx of DM in mom and sister   He has been drinking a lot of orange juice trying to self treat with vitamin C   Initial COVID TEST  NEGATIVE   Lab Results  Component Value Date   SARSCOV2NAA NEGATIVE 01/12/2023     Regarding pertinent Chronic problems:     Hyperlipidemia - on Zocor   HTN on amlodipine/benazepril and hydrochlorothiazide      OSA - noncompliant with CPAP  While in ER: Clinical Course as of 01/12/23 2329  Sun Jan 12, 2023  1528 Endotool ordered. Appears to have new onset diabetes, presenting with severe DKA and altered mental status. New AKI. Continuing sepsis work up. Likely also septic.  [LA]  1721 Spoke with Dr. Merrily Pew, critical care who requests hospitalist admission  [LA]  1736 Spoke with Dr. Sharl Ma, hospitalist who will  admit patient to step down  unit.  [LA]    Clinical Course User Index [LA] Cristopher Peru, PA-C     CT HEAD   NON acute  CXR -  NON acute  CTabd/pelvis - Mild stranding/edema around the pancreas, concerning for pancreatitis. No discrete, drainable fluid collection. Correlate with lipase.  Mild thickening of the proximal duodenum in the region of the pancreas, likely secondary/reactive change. Hepatic steatosis.    Indeterminate 2.6 cm left renal lesion. This may represent a cyst but recommend non-urgent follow-up renal ultrasound to better characterize. CT  chest - Lungs are clear. No pleural effusion or pneumothorax.   Following Medications were ordered in ER: Medications  insulin regular, human (MYXREDLIN) 100 units/ 100 mL infusion (4.6 Units/hr Intravenous Rate/Dose Change 01/12/23 2312)  lactated ringers infusion ( Intravenous Infusion Verify 01/12/23 2314)  dextrose 5 % in lactated ringers infusion (has no administration in time range)  dextrose 50 % solution 0-50 mL (has no administration in time range)  vancomycin variable dose per unstable renal function (pharmacist dosing) (has no administration in time range)  Chlorhexidine Gluconate Cloth 2 % PADS 6 each (6 each Topical Given 01/12/23 2313)  Oral care mouth rinse (has no administration in time range)  lactated ringers bolus 1,000 mL (0 mLs Intravenous Stopped 01/12/23 1514)  lactated ringers  bolus 1,000 mL (0 mLs Intravenous Stopped 01/12/23 1646)  piperacillin-tazobactam (ZOSYN) IVPB 3.375 g (0 g Intravenous Stopped 01/12/23 1530)  vancomycin (VANCOCIN) IVPB 1000 mg/200 mL premix (0 mg Intravenous Stopped 01/12/23 1652)    Followed by  vancomycin (VANCOCIN) 500 mg in sodium chloride 0.9 % 100 mL IVPB (0 mg Intravenous Stopped 01/12/23 1818)  sodium chloride 0.9 % bolus 1,000 mL (0 mLs Intravenous Stopped 01/12/23 1820)       ED Triage Vitals  Enc Vitals Group     BP 01/12/23 1410 (!) 118/94     Pulse Rate 01/12/23 1410  (!) 127     Resp 01/12/23 1410 14     Temp 01/12/23 1410 98.5 F (36.9 C)     Temp Source 01/12/23 1410 Oral     SpO2 01/12/23 1410 96 %     Weight 01/12/23 1414 180 lb (81.6 kg)     Height 01/12/23 1447 5' 9.5" (1.765 m)     Head Circumference --      Peak Flow --      Pain Score 01/12/23 1414 0     Pain Loc --      Pain Edu? --      Excl. in GC? --   TMAX(24)@     _________________________________________ Significant initial  Findings: Abnormal Labs Reviewed  LACTIC ACID, PLASMA - Abnormal; Notable for the following components:      Result Value   Lactic Acid, Venous 5.9 (*)    All other components within normal limits  LACTIC ACID, PLASMA - Abnormal; Notable for the following components:   Lactic Acid, Venous 6.8 (*)    All other components within normal limits  COMPREHENSIVE METABOLIC PANEL - Abnormal; Notable for the following components:   Sodium 133 (*)    Potassium 5.4 (*)    Chloride 87 (*)    CO2 13 (*)    Glucose, Bld >1,200 (*)    BUN 90 (*)    Creatinine, Ser 3.61 (*)    Calcium 12.2 (*)    Total Protein 9.7 (*)    AST 66 (*)    ALT 96 (*)    Total Bilirubin 1.8 (*)    GFR, Estimated 19 (*)    Anion gap 33 (*)    All other components within normal limits  CBC WITH DIFFERENTIAL/PLATELET - Abnormal; Notable for the following components:   WBC 18.7 (*)    HCT 53.4 (*)    Neutro Abs 16.8 (*)    Lymphs Abs 0.5 (*)    Monocytes Absolute 1.2 (*)    Abs Immature Granulocytes 0.15 (*)    All other components within normal limits  PROTIME-INR - Abnormal; Notable for the following components:   Prothrombin Time 15.9 (*)    INR 1.3 (*)    All other components within normal limits  URINALYSIS, ROUTINE W REFLEX MICROSCOPIC - Abnormal; Notable for the following components:   Glucose, UA >=500 (*)    Hgb urine dipstick TRACE (*)    Ketones, ur 40 (*)    All other components within normal limits  BETA-HYDROXYBUTYRIC ACID - Abnormal; Notable for the following  components:   Beta-Hydroxybutyric Acid >8.00 (*)    All other components within normal limits  BASIC METABOLIC PANEL - Abnormal; Notable for the following components:   Potassium 6.0 (*)    Chloride 91 (*)    CO2 14 (*)    Glucose, Bld >1,200 (*)    BUN 88 (*)  Creatinine, Ser 3.37 (*)    Calcium 11.3 (*)    GFR, Estimated 20 (*)    Anion gap 30 (*)    All other components within normal limits  BASIC METABOLIC PANEL - Abnormal; Notable for the following components:   CO2 21 (*)    Glucose, Bld 955 (*)    BUN 76 (*)    Creatinine, Ser 2.61 (*)    Calcium 11.6 (*)    GFR, Estimated 28 (*)    Anion gap 18 (*)    All other components within normal limits  LIPASE, BLOOD - Abnormal; Notable for the following components:   Lipase 1,194 (*)    All other components within normal limits  LACTIC ACID, PLASMA - Abnormal; Notable for the following components:   Lactic Acid, Venous 2.5 (*)    All other components within normal limits  URINALYSIS, MICROSCOPIC (REFLEX) - Abnormal; Notable for the following components:   Bacteria, UA MANY (*)    All other components within normal limits  GLUCOSE, CAPILLARY - Abnormal; Notable for the following components:   Glucose-Capillary >600 (*)    All other components within normal limits  I-STAT VENOUS BLOOD GAS, ED - Abnormal; Notable for the following components:   pCO2, Ven 32.7 (*)    pO2, Ven 48 (*)    Bicarbonate 14.7 (*)    TCO2 16 (*)    Acid-base deficit 11.0 (*)    Sodium 134 (*)    Potassium 5.4 (*)    Calcium, Ion 1.43 (*)    HCT 53.0 (*)    Hemoglobin 18.0 (*)    All other components within normal limits  CBG MONITORING, ED - Abnormal; Notable for the following components:   Glucose-Capillary >600 (*)    All other components within normal limits  I-STAT ARTERIAL BLOOD GAS, ED - Abnormal; Notable for the following components:   pH, Arterial 7.303 (*)    pO2, Arterial 70 (*)    Bicarbonate 16.2 (*)    TCO2 17 (*)    Acid-base  deficit 9.0 (*)    Calcium, Ion 1.56 (*)    All other components within normal limits  CBG MONITORING, ED - Abnormal; Notable for the following components:   Glucose-Capillary >600 (*)    All other components within normal limits  CBG MONITORING, ED - Abnormal; Notable for the following components:   Glucose-Capillary >600 (*)    All other components within normal limits  CBG MONITORING, ED - Abnormal; Notable for the following components:   Glucose-Capillary >600 (*)    All other components within normal limits  CBG MONITORING, ED - Abnormal; Notable for the following components:   Glucose-Capillary >600 (*)    All other components within normal limits  CBG MONITORING, ED - Abnormal; Notable for the following components:   Glucose-Capillary >600 (*)    All other components within normal limits  CBG MONITORING, ED - Abnormal; Notable for the following components:   Glucose-Capillary >600 (*)    All other components within normal limits  CBG MONITORING, ED - Abnormal; Notable for the following components:   Glucose-Capillary >600 (*)    All other components within normal limits     _________________________ Troponin  ordered ECG: Ordered Personally reviewed and interpreted by me showing: HR : 121 Rhythm:   Sinus tachycardia     no evidence of ischemic changes QTC 473   ____________________ This patient meets SIRS Criteria and may be septic.    The recent clinical  data is shown below. Vitals:   01/12/23 1900 01/12/23 2132 01/12/23 2140 01/12/23 2200  BP: (!) 139/96  (!) 125/105 (!) 133/111  Pulse: (!) 105   (!) 118  Resp: 20  (!) 25 (!) 23  Temp:  98.9 F (37.2 C)    TempSrc:  Oral    SpO2: 98%  100% 98%  Weight:      Height:        WBC     Component Value Date/Time   WBC 18.7 (H) 01/12/2023 1423   LYMPHSABS 0.5 (L) 01/12/2023 1423   MONOABS 1.2 (H) 01/12/2023 1423   EOSABS 0.0 01/12/2023 1423   BASOSABS 0.1 01/12/2023 1423    Lactic Acid, Venous     Component Value Date/Time   LATICACIDVEN 2.5 (HH) 01/12/2023 1932    Procalcitonin   Ordered     UA   no evidence of UTI     Urine analysis:    Component Value Date/Time   COLORURINE YELLOW 01/12/2023 1643   APPEARANCEUR CLEAR 01/12/2023 1643   LABSPEC 1.010 01/12/2023 1643   PHURINE 5.0 01/12/2023 1643   GLUCOSEU >=500 (A) 01/12/2023 1643   HGBUR TRACE (A) 01/12/2023 1643   BILIRUBINUR NEGATIVE 01/12/2023 1643   KETONESUR 40 (A) 01/12/2023 1643   PROTEINUR NEGATIVE 01/12/2023 1643   NITRITE NEGATIVE 01/12/2023 1643   LEUKOCYTESUR NEGATIVE 01/12/2023 1643    Results for orders placed or performed during the hospital encounter of 01/12/23  Resp panel by RT-PCR (RSV, Flu A&B, Covid) Anterior Nasal Swab     Status: None   Collection Time: 01/12/23  2:30 PM   Specimen: Anterior Nasal Swab  Result Value Ref Range Status   SARS Coronavirus 2 by RT PCR NEGATIVE NEGATIVE Final         Influenza A by PCR NEGATIVE NEGATIVE Final   Influenza B by PCR NEGATIVE NEGATIVE Final         Resp Syncytial Virus by PCR NEGATIVE NEGATIVE Final          _______________________________________________ Hospitalist was called for admission for   Diabetic ketoacidosis without coma, dehydration, acute encephalopathy, SIRS   AKI (acute kidney injury) (HCC)    The following Work up has been ordered so far:  Orders Placed This Encounter  Procedures   Critical Care   Blood Culture (routine x 2)   Resp panel by RT-PCR (RSV, Flu A&B, Covid) Anterior Nasal Swab   Urine Culture   MRSA Next Gen by PCR, Nasal   DG Chest Port 1 View   CT CHEST ABDOMEN PELVIS WO CONTRAST   CT Head Wo Contrast   Lactic acid, plasma   Comprehensive metabolic panel   CBC with Differential   Protime-INR   APTT   Urinalysis, Routine w reflex microscopic Urine, In & Out Cath   Urine rapid drug screen (hosp performed)   Osmolality   Beta-hydroxybutyric acid   Basic metabolic panel   Lipase, blood   Lactic acid,  plasma   Urinalysis, Microscopic (reflex)   Glucose, capillary   Beta-hydroxybutyric acid   Diet NPO time specified   Cardiac monitoring   Document height and weight   Assess and Document Glasgow Coma Scale   Document vital signs within 1-hour of fluid bolus completion.  Notify provider of abnormal vital signs despite fluid resuscitation.   Refer to Sidebar Report: Sepsis Bundle ED/IP   Notify provider for difficulties obtaining IV access   Initiate Carrier Fluid Protocol   Check Rectal Temperature  Notify physician (specify)   If present, discontinue Insulin Pump after IV Insulin is initiated.   Do NOT use lab glucose values in EndoTool.  If CBG meter reads "Critical High", enter 600.   IV insulin infusion with sufficient glucose should be continued until MD determines acidosis is corrected and places transition orders.   Upon IV fluid bolus completion, place order for STAT BMET (LAB15) and call provider with results.   K+ > 5 mEq/L and/or K+ addressed separately   Complete oral care assessment tool on admission, transfer, and q shift   Refer to Sidebar Report Adult Oral Care Protocol   Swab Process:   Considerations:   If MRSA PCR Screen is Positive:   Refer to Sidebar Report - CHG cloths Sidebar   Patient education (specify): - [facility] Daily CHG Bathing   Brush teeth with toothbrush and toothpaste 3 times daily   Apply moisturizer in mouth and lips prn   vancomycin per pharmacy consult   Consult to intensivist  [phone]   Consult to hospitalist  [phone]   Airborne and Contact precautions   Pulse oximetry, continuous   I-Stat venous blood gas, ED ([facility], [facility])   CBG monitoring, ED   CBG monitoring, ED   I-Stat arterial blood gas, ED   CBG monitoring, ED   CBG monitoring, ED   CBG monitoring, ED   CBG monitoring, ED   CBG monitoring, ED   CBG monitoring, ED   CBG monitoring, ED   EKG 12-Lead   ED EKG 12-Lead   Repeat EKG   Insert peripheral IV X 1   Admit to Inpatient  (patient's expected length of stay will be greater than 2 midnights or inpatient only procedure)     OTHER Significant initial  Findings:  labs showing:    Recent Labs  Lab 01/12/23 1423 01/12/23 1434 01/12/23 1600 01/12/23 1701 01/12/23 1932  NA 133* 134* 135 137 143  K 5.4* 5.4* 6.0* 4.4 4.0  CO2 13*  --  14*  --  21*  GLUCOSE >1,200*  --  >1,200*  --  955*  BUN 90*  --  88*  --  76*  CREATININE 3.61*  --  3.37*  --  2.61*  CALCIUM 12.2*  --  11.3*  --  11.6*    Cr   Up from baseline see below Lab Results  Component Value Date   CREATININE 2.61 (H) 01/12/2023   CREATININE 3.37 (H) 01/12/2023   CREATININE 3.61 (H) 01/12/2023    Recent Labs  Lab 01/12/23 1423  AST 66*  ALT 96*  ALKPHOS 76  BILITOT 1.8*  PROT 9.7*  ALBUMIN 4.6   Lab Results  Component Value Date   CALCIUM 11.6 (H) 01/12/2023    Plt: Lab Results  Component Value Date   PLT 325 01/12/2023    COVID-19 Labs  No results for input(s): "DDIMER", "FERRITIN", "LDH", "CRP" in the last 72 hours.  Lab Results  Component Value Date   SARSCOV2NAA NEGATIVE 01/12/2023   Venous  Blood Gas result:   ABG    Component Value Date/Time   PHART 7.303 (L) 01/12/2023 1701   PCO2ART 32.8 01/12/2023 1701   PO2ART 70 (L) 01/12/2023 1701   HCO3 16.2 (L) 01/12/2023 1701   TCO2 17 (L) 01/12/2023 1701   ACIDBASEDEF 9.0 (H) 01/12/2023 1701   O2SAT 92 01/12/2023 1701       Recent Labs  Lab 01/12/23 1423 01/12/23 1434 01/12/23 1701  WBC 18.7*  --   --  NEUTROABS 16.8*  --   --   HGB 17.0 18.0* 16.0  HCT 53.4* 53.0* 47.0  MCV 99.1  --   --   PLT 325  --   --     HG/HCT  stable,     Component Value Date/Time   HGB 16.0 01/12/2023 1701   HCT 47.0 01/12/2023 1701   MCV 99.1 01/12/2023 1423    Recent Labs  Lab 01/12/23 1600  LIPASE 1,194*   No results for input(s): "AMMONIA" in the last 168 hours.    Cardiac Panel (last 3 results) No results for input(s): "CKTOTAL", "CKMB", "TROPONINI",  "RELINDX" in the last 72 hours.  .car BNP (last 3 results) No results for input(s): "BNP" in the last 8760 hours.    DM  labs:  HbA1C: No results for input(s): "HGBA1C" in the last 8760 hours.     CBG (last 3)  Recent Labs    01/12/23 2133 01/12/23 2236 01/12/23 2310  GLUCAP >600* >600* >600*      Cultures: No results found for: "SDES", "SPECREQUEST", "CULT", "REPTSTATUS"   Radiological Exams on Admission: CT Head Wo Contrast  Result Date: 01/12/2023 CLINICAL DATA:  Mental status change, unknown cause EXAM: CT HEAD WITHOUT CONTRAST TECHNIQUE: Contiguous axial images were obtained from the base of the skull through the vertex without intravenous contrast. RADIATION DOSE REDUCTION: This exam was performed according to the departmental dose-optimization program which includes automated exposure control, adjustment of the mA and/or kV according to patient size and/or use of iterative reconstruction technique. COMPARISON:  None Available. FINDINGS: Brain: No evidence of acute infarction, hemorrhage, hydrocephalus, extra-axial collection or mass lesion/mass effect. Mild patchy white matter hypodensities, nonspecific but compatible with chronic microvascular ischemic disease. Vascular: No hyperdense vessel identified. Skull: No acute fracture. Sinuses/Orbits: Mild paranasal sinus mucosal thickening. No acute orbital findings. Other: No mastoid effusions. IMPRESSION: No evidence of acute intracranial abnormality. Electronically Signed   By: Feliberto Harts M.D.   On: 01/12/2023 16:56   CT CHEST ABDOMEN PELVIS WO CONTRAST  Result Date: 01/12/2023 CLINICAL DATA:  Sepsis EXAM: CT CHEST, ABDOMEN AND PELVIS WITHOUT CONTRAST TECHNIQUE: Multidetector CT imaging of the chest, abdomen and pelvis was performed following the standard protocol without IV contrast. RADIATION DOSE REDUCTION: This exam was performed according to the departmental dose-optimization program which includes automated exposure  control, adjustment of the mA and/or kV according to patient size and/or use of iterative reconstruction technique. COMPARISON:  None Available. FINDINGS: CT CHEST FINDINGS Cardiovascular: Calcific atherosclerosis of the coronary arteries. Normal heart size. No pericardial effusion. Mediastinum/Nodes: No enlarged mediastinal, hilar, or axillary lymph nodes. Thyroid gland, trachea, and esophagus demonstrate no significant findings. Lungs/Pleura: Lungs are clear. No pleural effusion or pneumothorax. Musculoskeletal: No chest wall mass or suspicious bone lesions identified. CT ABDOMEN PELVIS FINDINGS Hepatobiliary: Diffuse low-attenuation liver, compatible with hepatic steatosis. No visible mass lesion. Gallbladder is hyperdense but otherwise unremarkable. Pancreas: Mild stranding/edema around the pancreas, particularly the pancreatic head. Spleen: Normal in size without focal abnormality. Adrenals/Urinary Tract: Adrenal glands are unremarkable. No hydronephrosis. Approximately 2.6 cm hypoattenuating left renal lesion with Hounsfield unit measurements that are indeterminate in areas. Stomach/Bowel: Mild thickening of the duodenum in the region the pancreas. Otherwise, unremarkable appearance of bowel loops. No dilated bowel loops to suggest obstruction. Normal appendix. Vascular/Lymphatic: No significant vascular findings are present. No enlarged abdominal or pelvic lymph nodes. Reproductive: Prostate is unremarkable. Other: Mild laxity anterior abdominal wall.  No significant ascites. Musculoskeletal: L5-S1 degenerative change. IMPRESSION: 1. Mild  stranding/edema around the pancreas, concerning for pancreatitis. No discrete, drainable fluid collection. Correlate with lipase. 2. Mild thickening of the proximal duodenum in the region of the pancreas, likely secondary/reactive change. 3. Hepatic steatosis. 4. Indeterminate 2.6 cm left renal lesion. This may represent a cyst but recommend non-urgent follow-up renal  ultrasound to better characterize. Electronically Signed   By: Feliberto Harts M.D.   On: 01/12/2023 16:55   DG Chest Port 1 View  Result Date: 01/12/2023 CLINICAL DATA:  Cough and upper respiratory infection. EXAM: PORTABLE CHEST 1 VIEW COMPARISON:  07/31/2020 FINDINGS: 1417 hours. Low volume film. The lungs are clear without focal pneumonia, edema, pneumothorax or pleural effusion. The cardiopericardial silhouette is within normal limits for size. The visualized bony structures of the thorax are unremarkable. Telemetry leads overlie the chest. IMPRESSION: Low volume film without acute cardiopulmonary findings. Electronically Signed   By: Kennith Center M.D.   On: 01/12/2023 14:39   _______________________________________________________________________________________________________ Latest  Blood pressure (!) 133/111, pulse (!) 118, temperature 98.9 F (37.2 C), temperature source Oral, resp. rate (!) 23, height 5' 9.5" (1.765 m), weight 81.6 kg, SpO2 98 %.   Vitals  labs and radiology finding personally reviewed  Review of Systems:    Pertinent positives include:    fatigue, increased urination  Constitutional:  No weight loss, night sweats, Fevers, chills,weight loss  HEENT:  No headaches, Difficulty swallowing,Tooth/dental problems,Sore throat,  No sneezing, itching, ear ache, nasal congestion, post nasal drip,  Cardio-vascular:  No chest pain, Orthopnea, PND, anasarca, dizziness, palpitations.no Bilateral lower extremity swelling  GI:  No heartburn, indigestion, abdominal pain, nausea, vomiting, diarrhea, change in bowel habits, loss of appetite, melena, blood in stool, hematemesis Resp:  no shortness of breath at rest. No dyspnea on exertion, No excess mucus, no productive cough, No non-productive cough, No coughing up of blood.No change in color of mucus.No wheezing. Skin:  no rash or lesions. No jaundice GU:  no dysuria, change in color of urine, no urgency or frequency. No  straining to urinate.  No flank pain.  Musculoskeletal:  No joint pain or no joint swelling. No decreased range of motion. No back pain.  Psych:  No change in mood or affect. No depression or anxiety. No memory loss.  Neuro: no localizing neurological complaints, no tingling, no weakness, no double vision, no gait abnormality, no slurred speech, no confusion  All systems reviewed and apart from HOPI all are negative _______________________________________________________________________________________________ Past Medical History:   Past Medical History:  Diagnosis Date   Hyperlipemia    Hypertension      History reviewed. No pertinent surgical history.  Social History:  Ambulatory   independently       reports that he has never smoked. He has never used smokeless tobacco. He reports that he does not drink alcohol and does not use drugs.     Family History: Family hx of Dm2 _________________________________________ Allergies: No Known Allergies   Prior to Admission medications   Medication Sig Start Date End Date Taking? Authorizing Provider  escitalopram (LEXAPRO) 20 MG tablet Take 1 tablet by mouth daily. 09/09/22  Yes [provider]  amLODipine-benazepril (LOTREL) 5-10 MG per capsule Take 1 capsule by mouth daily.     [provider]  Glucosamine 500 MG CAPS Take 1 tablet by mouth daily.    [provider]  simvastatin (ZOCOR) 10 MG tablet Take 10 mg by mouth every morning.     [provider]  vitamin B-12 (CYANOCOBALAMIN) 100 MCG  tablet Take 100 mcg by mouth.    [provider]    ___________________________________________________________________________________ Physical Exam:    01/12/2023   10:00 PM 01/12/2023    9:40 PM 01/12/2023    7:00 PM  Vitals with BMI  Systolic [phone]  Diastolic 528 413 96  Pulse 118  105     1. General:  in No  Acute distress   acutely ill -appearing 2. Psychological: Alert  and   Oriented to self 3. Head/ENT:   Dry Mucous Membranes                          Head Non traumatic, neck supple                          Poor Dentition 4. SKIN:  decreased Skin turgor,  Skin clean Dry and intact no rash 5. Heart: Regular rate and rhythm no  Murmur, no Rub or gallop 6. Lungs:  , no wheezes or crackles   7. Abdomen: Soft,  non-tender,   distended , slight suprapubic fullness  obese  bowel sounds present 8. Lower extremities: no clubbing, cyanosis, no  edema 9. Neurologically Grossly intact, moving all 4 extremities equally   10. MSK: Normal range of motion    Chart has been reviewed  ______________________________________________________________________________________________  Assessment/Plan 57 y.o. male with medical history significant of HTN, elevated LFT, anxiety, HLD  Admitted for   Diabetic ketoacidosis without coma, dehydration, acute encephalopathy, SIRS   AKI (acute kidney injury) ([place])     Present on Admission:  DKA (diabetic ketoacidosis) ([place])  AKI (acute kidney injury) ([place])  Pancreatitis  Hypercalcemia  Elevated LFTs  SIRS (systemic inflammatory response syndrome) ([place])    DKA (diabetic ketoacidosis) ([place]) will admit per DKA/  protocol, obtain serial BMET, start on glucosestabalizer, aggressive IVF.   Change IVF to D5 1/2Na after BG <250 .  So far work up of possible causes of DKA/HSS with CXR, ECG  UA.  have been unremarkable New onset DM Monitor in [place]. Replace potassium as needed.      Consult diabetes coordinator    AKI (acute kidney injury) ([place]) In the setting of severe dehydration  Obtain urine electrolytes  Given renal cyst will obtain rena [place]  Follow renal function expect improvement with rehydration no evidence of obstruction on CT  Pancreatitis Noted somewhat elevated lipase and edema around pancreas Possibly early pancreatitis.  Keep n.p.o. aggressive fluid resuscitation and continue to  follow  Hypercalcemia Mild in the setting of severe dehydration expect will stabilize while patient is adequately rehydrated  Elevated LFTs History of elevated LFTs in the past Check hepatitis serologies Continue to follow hold statins check CK Check ferritin and iron levels  SIRS (systemic inflammatory response syndrome) (HCC)  -SIRS criteria met with  elevated white blood cell count,   suspect in the setting of hemoconcentration    Component Value Date/Time   WBC 18.7 (H) 01/12/2023 1423   LYMPHSABS 0.5 (L) 01/12/2023 1423    tachycardia   ,  RR >20 Today's Vitals   01/12/23 2132 01/12/23 2140 01/12/23 2200 01/12/23 2316  BP:  (!) 125/105 (!) 133/111 (!) 136/99  Pulse:   (!) 118   Resp:  (!) 25 (!) 23 (!) 24  Temp: 98.9 F (37.2 C)   97.9 F (36.6 C)  TempSrc: Oral   Oral  SpO2:  100% 98%   Weight:  81.5 kg  Height:    5' 9.5" (1.765 m)  PainSc:       Body mass index is 26.15 kg/m.  This patient meets SIRS Criteria and may be septic.     The recent clinical data is shown below. Vitals:   01/12/23 2132 01/12/23 2140 01/12/23 2200 01/12/23 2316  BP:  (!) 125/105 (!) 133/111 (!) 136/99  Pulse:   (!) 118   Resp:  (!) 25 (!) 23 (!) 24  Temp: 98.9 F (37.2 C)   97.9 F (36.6 C)  TempSrc: Oral   Oral  SpO2:  100% 98%   Weight:    81.5 kg  Height:    5' 9.5" (1.765 m)      I suspect that SIRS criteria were due to ( dehydration, ) At this Time no source of infection    The patient is noted to have a lactate>4. With the current information available to me, I don't think the patient is in septic shock. The lactate>4, is related to  dehydration, and history of underlying liver disease lactic acid has been improving.  Rehydration   -Most likely source being: Source of sepsis is unknown but just for tonight continue antibiotics if blood cultures unremarkable would de-escalate     - Obtain serial lactic acid and procalcitonin level.  - Initiated IV antibiotics in  ER: Antibiotics Given (last 72 hours)     Date/Time Action Medication Dose Rate   01/12/23 1500 New Bag/Given   piperacillin-tazobactam (ZOSYN) IVPB 3.375 g 3.375 g 100 mL/hr   01/12/23 1552 New Bag/Given   vancomycin (VANCOCIN) IVPB 1000 mg/200 mL premix 1,000 mg 200 mL/hr   01/12/23 1718 New Bag/Given   vancomycin (VANCOCIN) 500 mg in sodium chloride 0.9 % 100 mL IVPB 500 mg 100 mL/hr       Will continue  on : Cefepime Vanco and metronidazole for tonight If no evidence of infection   would de-escalate - await results of blood and urine culture  - Rehydrate aggressively  Intravenous fluids were administered,         30cc/kg fluid    12:12 AM    Other plan as per orders.  DVT prophylaxis:  SCD     Code Status:    Code Status: Not on file FULL CODE  as per patient  I had personally discussed CODE STATUS with patient and family   Family Communication:   Family  Bedside  plan of care was discussed on the phone with  Wife,    Disposition Plan:    To home once workup is complete and patient is stable   Following barriers for discharge:                            Electrolytes corrected                               Anemia corrected                                                       Will need to be able to tolerate PO  Diabetes care coordinator                   Consults called: none  Admission status:  ED Disposition     ED Disposition  Admit   Condition  --   Comment  Hospital Area: [facility] [[id number]]  Level of Care: Stepdown [14]  Admit to SDU based on following criteria: Severe physiological/psychological symptoms:  Any diagnosis requiring assessment & intervention at least every 4 hours on an ongoing basis to obtain desired patient outcomes including stability and rehabilitation  May admit patient to [facility] or [facility] if equivalent level of care is available:: No  Interfacility  transfer: Yes  Covid Evaluation: Asymptomatic - no recent exposure (last 10 days) testing not required  Diagnosis: DKA (diabetic ketoacidosis) (HCC) [409811]  Admitting Physician: Lovie Chol  Attending Physician: Meredeth Ide [[id number]]  Certification:: I certify this patient will need inpatient services for at least 2 midnights  Estimated Length of Stay: 3          inpatient     I Expect 2 midnight stay secondary to severity of patient's current illness need for inpatient interventions justified by the following:  hemodynamic instability despite optimal treatment (tachycardia  )   Severe lab/radiological/exam abnormalities including:    DKA. SIRS    That are currently affecting medical management.   I expect  patient to be hospitalized for 2 midnights requiring inpatient medical care.  Patient is at high risk for adverse outcome (such as loss of life or disability) if not treated.  Indication for inpatient stay as follows:  Severe change from baseline regarding mental status   inability to maintain oral hydration     Need for IV antibiotics, IV fluids,     Level of care  stepdown indefinitely please discontinue once patient no longer qualifies COVID-19 Labs    Lab Results  Component Value Date   SARSCOV2NAA NEGATIVE 01/12/2023     Precautions: admitted as   Covid Negative    Lalah Durango 01/13/2023, 12:31 AM    [facility]     after 2 AM please page floor coverage PA If 7AM-7PM, please contact the day team taking care of the patient using [email or web address]   Patient was evaluated in the context of the global COVID-19 pandemic, which necessitated consideration that the patient might be at risk for infection with the SARS-CoV-2 virus that causes COVID-19. Institutional protocols and algorithms that pertain to the evaluation of patients at risk for COVID-19 are in a state of rapid change based on information released by regulatory bodies including the CDC  and federal and state organizations. These policies and algorithms were followed during the patient's care.

## 2023-01-12 NOTE — ED Notes (Signed)
Night shift nurse for 1226 at [facility] called and updated on patient's current condition and most recent labs.

## 2023-01-12 NOTE — ED Notes (Signed)
1 set blood cultures collected from [facility] IV start and 1 set blood cultures collected from L hand.

## 2023-01-12 NOTE — ED Provider Notes (Signed)
[place] EMERGENCY DEPARTMENT AT [place] [facility] [facility] Provider Note   CSN: [id number] Arrival date & time: 01/12/23  1402     History  Chief Complaint  Patient presents with   Altered Mental Status    Vincent Thompson is a 57 y.o. male.  With past medical his tree of hypertension, hyperlipidemia who presents to the emergency department with altered mental status.  Level 5 caveat: Altered mental status  Wife at bedside who provides the history.  She states that the patient was sick with a cough about 10 days ago.  She states that he seemed to be getting better but yesterday she noticed that he was tired and slept all day.  She states it is abnormal because football is on and he is normally watching this.  She states that today she went to check on him and he was lethargic, confused and so she brought him to the emergency department.  She denies him having any recent other complaints like abdominal pain or dysuria.  No head trauma.  She states that he is continuing to cough.  She denies noticing him being short of breath over the past few days.   Cough      Home Medications Prior to Admission medications   Medication Sig Start Date End Date Taking? Authorizing Provider  escitalopram (LEXAPRO) 20 MG tablet Take 1 tablet by mouth daily. 09/09/22  Yes [provider]  amLODipine-benazepril (LOTREL) 5-10 MG per capsule Take 1 capsule by mouth daily.     [provider]  Glucosamine 500 MG CAPS Take 1 tablet by mouth daily.    [provider]  simvastatin (ZOCOR) 10 MG tablet Take 10 mg by mouth every morning.     [provider]  vitamin B-12 (CYANOCOBALAMIN) 100 MCG tablet Take 100 mcg by mouth.    [provider]      Allergies    Patient has no known allergies.    Review of Systems   Review of Systems  Unable to perform ROS: Mental status change    Physical Exam Updated Vital Signs BP (!) 149/107   Pulse (!) 109   Temp 99.1  F (37.3 C) (Tympanic)   Resp 19   Ht 5' 9.5" (1.765 m)   Wt 81.6 kg   SpO2 97%   BMI 26.20 kg/m  Physical Exam Vitals and nursing note reviewed.  Constitutional:      General: He is in acute distress.     Appearance: He is ill-appearing and toxic-appearing.  HENT:     Head: Normocephalic.     Mouth/Throat:     Mouth: Mucous membranes are moist.     Pharynx: Posterior oropharyngeal erythema present.     Comments: Dried blood in the posterior oropharynx Eyes:     General: No scleral icterus.    Pupils: Pupils are equal, round, and reactive to light.  Cardiovascular:     Rate and Rhythm: Regular rhythm. Tachycardia present.     Pulses: Normal pulses.     Heart sounds: No murmur heard. Pulmonary:     Effort: Pulmonary effort is normal. Tachypnea present. No accessory muscle usage or respiratory distress.  Abdominal:     General: Abdomen is protuberant. Bowel sounds are normal. There is no distension.     Palpations: Abdomen is soft.     Tenderness: There is no abdominal tenderness.  Musculoskeletal:     Cervical back: No rigidity.  Skin:    General: Skin is warm  and dry.     Capillary Refill: Capillary refill takes less than 2 seconds.     Findings: No rash.  Neurological:     General: No focal deficit present.     Mental Status: He is lethargic and confused.  Psychiatric:        Attention and Perception: He is inattentive.        Speech: Speech is delayed.        Cognition and Memory: Cognition is impaired.     ED Results / Procedures / Treatments   Labs (all labs ordered are listed, but only abnormal results are displayed) Labs Reviewed  LACTIC ACID, PLASMA - Abnormal; Notable for the following components:      Result Value   Lactic Acid, Venous 5.9 (*)    All other components within normal limits  LACTIC ACID, PLASMA - Abnormal; Notable for the following components:   Lactic Acid, Venous 6.8 (*)    All other components within normal limits  COMPREHENSIVE  METABOLIC PANEL - Abnormal; Notable for the following components:   Sodium 133 (*)    Potassium 5.4 (*)    Chloride 87 (*)    CO2 13 (*)    Glucose, Bld >1,200 (*)    BUN 90 (*)    Creatinine, Ser 3.61 (*)    Calcium 12.2 (*)    Total Protein 9.7 (*)    AST 66 (*)    ALT 96 (*)    Total Bilirubin 1.8 (*)    GFR, Estimated 19 (*)    Anion gap 33 (*)    All other components within normal limits  CBC WITH DIFFERENTIAL/PLATELET - Abnormal; Notable for the following components:   WBC 18.7 (*)    HCT 53.4 (*)    Neutro Abs 16.8 (*)    Lymphs Abs 0.5 (*)    Monocytes Absolute 1.2 (*)    Abs Immature Granulocytes 0.15 (*)    All other components within normal limits  PROTIME-INR - Abnormal; Notable for the following components:   Prothrombin Time 15.9 (*)    INR 1.3 (*)    All other components within normal limits  URINALYSIS, ROUTINE W REFLEX MICROSCOPIC - Abnormal; Notable for the following components:   Glucose, UA >=500 (*)    Hgb urine dipstick TRACE (*)    Ketones, ur 40 (*)    All other components within normal limits  BASIC METABOLIC PANEL - Abnormal; Notable for the following components:   Potassium 6.0 (*)    Chloride 91 (*)    CO2 14 (*)    Glucose, Bld >1,200 (*)    BUN 88 (*)    Creatinine, Ser 3.37 (*)    Calcium 11.3 (*)    GFR, Estimated 20 (*)    Anion gap 30 (*)    All other components within normal limits  URINALYSIS, MICROSCOPIC (REFLEX) - Abnormal; Notable for the following components:   Bacteria, UA MANY (*)    All other components within normal limits  I-STAT VENOUS BLOOD GAS, ED - Abnormal; Notable for the following components:   pCO2, Ven 32.7 (*)    pO2, Ven 48 (*)    Bicarbonate 14.7 (*)    TCO2 16 (*)    Acid-base deficit 11.0 (*)    Sodium 134 (*)    Potassium 5.4 (*)    Calcium, Ion 1.43 (*)    HCT 53.0 (*)    Hemoglobin 18.0 (*)    All other components within normal  limits  CBG MONITORING, ED - Abnormal; Notable for the following  components:   Glucose-Capillary >600 (*)    All other components within normal limits  I-STAT ARTERIAL BLOOD GAS, ED - Abnormal; Notable for the following components:   pH, Arterial 7.303 (*)    pO2, Arterial 70 (*)    Bicarbonate 16.2 (*)    TCO2 17 (*)    Acid-base deficit 9.0 (*)    Calcium, Ion 1.56 (*)    All other components within normal limits  CBG MONITORING, ED - Abnormal; Notable for the following components:   Glucose-Capillary >600 (*)    All other components within normal limits  RESP PANEL BY RT-PCR (RSV, FLU A&B, COVID)  RVPGX2  CULTURE, BLOOD (ROUTINE X 2)  CULTURE, BLOOD (ROUTINE X 2)  URINE CULTURE  APTT  RAPID URINE DRUG SCREEN, HOSP PERFORMED  OSMOLALITY  BETA-HYDROXYBUTYRIC ACID  BASIC METABOLIC PANEL  BASIC METABOLIC PANEL  BASIC METABOLIC PANEL  LIPASE, BLOOD  LACTIC ACID, PLASMA  LACTIC ACID, PLASMA  LACTIC ACID, PLASMA  LACTIC ACID, PLASMA    EKG EKG Interpretation  Date/Time:  Sunday January 12 2023 14:16:47 EST Ventricular Rate:  121 PR Interval:  147 QRS Duration: 88 QT Interval:  333 QTC Calculation: 473 R Axis:   70 Text Interpretation: Sinus tachycardia Confirmed by Edwin Dada ([id number]) on 01/12/2023 3:17:22 PM  Radiology DG Chest Port 1 View  Result Date: 01/12/2023 CLINICAL DATA:  Cough and upper respiratory infection. EXAM: PORTABLE CHEST 1 VIEW COMPARISON:  07/31/2020 FINDINGS: 1417 hours. Low volume film. The lungs are clear without focal pneumonia, edema, pneumothorax or pleural effusion. The cardiopericardial silhouette is within normal limits for size. The visualized bony structures of the thorax are unremarkable. Telemetry leads overlie the chest. IMPRESSION: Low volume film without acute cardiopulmonary findings. Electronically Signed   By: Kennith Center M.D.   On: 01/12/2023 14:39    Procedures .Critical Care  Performed by: Cristopher Peru, PA-C Authorized by: Cristopher Peru, PA-C   Critical care provider statement:     Critical care time (minutes):  75   Critical care time was exclusive of:  Separately billable procedures and treating other patients   Critical care was necessary to treat or prevent imminent or life-threatening deterioration of the following conditions:  Endocrine crisis, dehydration, sepsis and renal failure   Critical care was time spent personally by me on the following activities:  Development of treatment plan with patient or surrogate, discussions with consultants, discussions with primary provider, evaluation of patient's response to treatment, examination of patient, interpretation of cardiac output measurements, obtaining history from patient or surrogate, review of old charts, re-evaluation of patient's condition, pulse oximetry, ordering and review of radiographic studies, ordering and review of laboratory studies and ordering and performing treatments and interventions   I assumed direction of critical care for this patient from another provider in my specialty: no     Care discussed with: admitting provider      Medications Ordered in ED Medications  insulin regular, human (MYXREDLIN) 100 units/ 100 mL infusion (4.6 Units/hr Intravenous Rate/Dose Change 01/12/23 1652)  lactated ringers infusion ( Intravenous New Bag/Given 01/12/23 1647)  dextrose 5 % in lactated ringers infusion (has no administration in time range)  dextrose 50 % solution 0-50 mL (has no administration in time range)  vancomycin (VANCOCIN) IVPB 1000 mg/200 mL premix (0 mg Intravenous Stopped 01/12/23 1652)    Followed by  vancomycin (VANCOCIN) 500 mg in sodium chloride 0.9 %  100 mL IVPB (500 mg Intravenous New Bag/Given 01/12/23 1718)  vancomycin variable dose per unstable renal function (pharmacist dosing) (has no administration in time range)  lactated ringers bolus 1,000 mL (0 mLs Intravenous Stopped 01/12/23 1514)  lactated ringers bolus 1,000 mL (0 mLs Intravenous Stopped 01/12/23 1646)  piperacillin-tazobactam  (ZOSYN) IVPB 3.375 g (0 g Intravenous Stopped 01/12/23 1530)  sodium chloride 0.9 % bolus 1,000 mL (1,000 mLs Intravenous New Bag/Given 01/12/23 1728)    ED Course/ Medical Decision Making/ A&P Clinical Course as of 01/12/23 1751  Sun Jan 12, 2023  1528 Endotool ordered. Appears to have new onset diabetes, presenting with severe DKA and altered mental status. New AKI. Continuing sepsis work up. Likely also septic.  [LA]  1721 Spoke with Dr. Merrily Pew, critical care who requests hospitalist admission  [LA]  1736 Spoke with Dr. Sharl Ma, hospitalist who will admit patient to step down  unit.  [LA]    Clinical Course User Index [LA] Cristopher Peru, PA-C   {                           Medical Decision Making Amount and/or Complexity of Data Reviewed Labs: ordered. Radiology: ordered. ECG/medicine tests: ordered.  Risk Prescription drug management. Decision regarding hospitalization.  Initial Impression and Ddx 69--year-old male who presents to the emergency department with altered mental status.  Patient in acute distress, toxic appearing.  He is tachycardic, febrile, altered.  Starting septic workup also obtaining UDS, venous gas.  Patient PMH that increases complexity of ED encounter: Hypertension, hyperlipidemia  Interpretation of Diagnostics I independent reviewed and interpreted the labs as followed: BG >1200, pH 7.25, bicarb 16, AG 33, UA negative, covid/flu/rsv negative, UDS negative, pCO2 not retaining, K 6, AKI with Cr 3.37,   - I independently visualized the following imaging with scope of interpretation limited to determining acute life threatening conditions related to emergency care: CT CAP, which revealed pancreatitis, CXR negative  Patient Reassessment and Ultimate Disposition/Management Critically ill 57 year old male. Initially presented as septic with tachycardia, fever. Sepsis work up was initiated and found to have BG >1200. No documented history of diabetes.  AMS - 2/2  to DKA  - CT head negative  - UDS negative  - CO2 without retention  DKA - with altered mental status, no coma; protecting airway. Does need 2-4L supplementation for intermittent hypoxia to ~90%.  - BG >1200, AG 33, initially acidotic to 7.25, bicarb 16  - getting 3L IVF bolus  - insulin gtt  - K+ 6. Getting insulin gtt. Will allow glucose to normalize, insulin to correct. Would give lokelma but confused and NPO at this time.   Fever  - likely concomitant infectious work up  - WBC 18 - could be reactive  - CXR negative, UA negative  - CT CAP with pancreatitis without necrosis  - Bcx pending  - lactic 5.9->6.8 - likely severe dehydration, getting IVF - broad spectrum abx with vanc/zosyn started.  AKI  - likely prerenal with severe dehydration in the setting of DKA. 3L IVF given here. Repeated BMP shows mildly improving Cr.  - will need trending BMP. If not improving may need nephro consult on admission.  Vitals have been stable. Continues to maintain his airway. Not severely tachypneic or acidotic at this time despite severe DKA.   Consulted with Dr. Merrily Pew, critical care who thinks patient appropriate for hospitalist medicine. Consulted and spoke with Dr. Sharl Ma who accepts patient to  SDU for admission.   Patient management required discussion with the following services or consulting groups:  Hospitalist Service and Intensivist Service  Complexity of Problems Addressed Acute illness or injury that poses threat of life of bodily function  Additional Data Reviewed and Analyzed Further history obtained from: Further history from spouse/family member, Past medical history and medications listed in the EMR, and Care Everywhere  Patient Encounter Risk Assessment SDOH impact on management and Consideration of hospitalization  Final Clinical Impression(s) / ED Diagnoses Final diagnoses:  Diabetic ketoacidosis without coma associated with type 2 diabetes mellitus (HCC)  Confusion   AKI (acute kidney injury) [facility])    Rx / DC Orders ED Discharge Orders     None         Cristopher Peru, PA-C 01/12/23 1752    Lonell Grandchild, MD 01/12/23 2300

## 2023-01-12 NOTE — Progress Notes (Addendum)
Pharmacy Antibiotic Note  Vincent Thompson is a 57 y.o. male for which pharmacy has been consulted for vancomycin dosing for sepsis.  Patient with a history of HTN, HLD. Patient presenting with cough.  SCr 3.61 - 0.99 in September 23 per [facility] records WBC 18.7; LA 5.9>6.8; T 100.2; HR 114; RR 27 COVID neg / flu neg  Plan: Zosyn per MD Vancomycin 1500 mg once, subsequent dosing as indicated per random vancomycin level until renal function stable and/or improved, at which time scheduled dosing can be considered Trend WBC, Fever, Renal function F/u cultures, clinical course, WBC De-escalate when able  Height: 5' 9.5" (176.5 cm) Weight: 81.6 kg (180 lb) IBW/kg (Calculated) : 71.85  Temp (24hrs), Avg:99.4 F (37.4 C), Min:98.5 F (36.9 C), Max:100.2 F (37.9 C)  Recent Labs  Lab 01/12/23 1423  WBC 18.7*  CREATININE 3.61*  LATICACIDVEN 5.9*    Estimated Creatinine Clearance: 23 mL/min (A) (by C-G formula based on SCr of 3.61 mg/dL (H)).    No Known Allergies  Antimicrobials this admission: zosyn 1/21 >>  vancomycin 1/21 >>   Microbiology results: Pending  Thank you for allowing pharmacy to be a part of this patient's care.  Lorelei Pont, PharmD, BCPS 01/12/2023 3:23 PM ED Clinical Pharmacist -  [phone]

## 2023-01-12 NOTE — Assessment & Plan Note (Signed)
will admit per DKA/  protocol, obtain serial BMET, start on glucosestabalizer, aggressive IVF.   Change IVF to D5 1/2Na after BG <250 .  So far work up of possible causes of DKA/HSS with CXR, ECG  UA.  have been unremarkable New onset DM Monitor in [place]. Replace potassium as needed.      Consult diabetes coordinator

## 2023-01-12 NOTE — Subjective & Objective (Signed)
Reports cough and URI for 2 wks Today lethargic, tested negative for COVID 1 wk ago endorses confusion. At first he was getting a bit better yesterday but then started to get again more lethargic and tired.  That is what triggered him to go to emergency department.  Otherwise wife is unsure about any abdominal pain or dysuria he still has some cough.

## 2023-01-12 NOTE — ED Triage Notes (Signed)
Cough and URI x 2.5 weeks , lethargic today , weakness to bilateral legs . Negative Covid test last week .

## 2023-01-13 ENCOUNTER — Inpatient Hospital Stay (HOSPITAL_COMMUNITY): Payer: 59

## 2023-01-13 ENCOUNTER — Encounter (HOSPITAL_COMMUNITY): Payer: Self-pay | Admitting: Internal Medicine

## 2023-01-13 DIAGNOSIS — E1110 Type 2 diabetes mellitus with ketoacidosis without coma: Secondary | ICD-10-CM | POA: Diagnosis not present

## 2023-01-13 DIAGNOSIS — R6510 Systemic inflammatory response syndrome (SIRS) of non-infectious origin without acute organ dysfunction: Secondary | ICD-10-CM | POA: Diagnosis present

## 2023-01-13 DIAGNOSIS — R7989 Other specified abnormal findings of blood chemistry: Secondary | ICD-10-CM | POA: Diagnosis present

## 2023-01-13 DIAGNOSIS — G9341 Metabolic encephalopathy: Secondary | ICD-10-CM | POA: Diagnosis present

## 2023-01-13 DIAGNOSIS — K859 Acute pancreatitis without necrosis or infection, unspecified: Secondary | ICD-10-CM | POA: Diagnosis present

## 2023-01-13 DIAGNOSIS — N179 Acute kidney failure, unspecified: Secondary | ICD-10-CM | POA: Diagnosis present

## 2023-01-13 LAB — BASIC METABOLIC PANEL
Anion gap: 16 — ABNORMAL HIGH (ref 5–15)
Anion gap: 11 (ref 5–15)
Anion gap: 11 (ref 5–15)
Anion gap: 10 (ref 5–15)
Anion gap: 12 (ref 5–15)
Anion gap: 9 (ref 5–15)
BUN: 66 mg/dL — ABNORMAL HIGH (ref 6–20)
BUN: 64 mg/dL — ABNORMAL HIGH (ref 6–20)
BUN: 55 mg/dL — ABNORMAL HIGH (ref 6–20)
BUN: 52 mg/dL — ABNORMAL HIGH (ref 6–20)
BUN: 51 mg/dL — ABNORMAL HIGH (ref 6–20)
BUN: 43 mg/dL — ABNORMAL HIGH (ref 6–20)
CO2: 25 mmol/L (ref 22–32)
CO2: 26 mmol/L (ref 22–32)
CO2: 27 mmol/L (ref 22–32)
CO2: 27 mmol/L (ref 22–32)
CO2: 24 mmol/L (ref 22–32)
CO2: 24 mmol/L (ref 22–32)
Calcium: 12.4 mg/dL — ABNORMAL HIGH (ref 8.9–10.3)
Calcium: 12.4 mg/dL — ABNORMAL HIGH (ref 8.9–10.3)
Calcium: 12 mg/dL — ABNORMAL HIGH (ref 8.9–10.3)
Calcium: 12.1 mg/dL — ABNORMAL HIGH (ref 8.9–10.3)
Calcium: 11.6 mg/dL — ABNORMAL HIGH (ref 8.9–10.3)
Calcium: 10.2 mg/dL (ref 8.9–10.3)
Chloride: 110 mmol/L (ref 98–111)
Chloride: 118 mmol/L — ABNORMAL HIGH (ref 98–111)
Chloride: 124 mmol/L — ABNORMAL HIGH (ref 98–111)
Chloride: 126 mmol/L — ABNORMAL HIGH (ref 98–111)
Chloride: 128 mmol/L — ABNORMAL HIGH (ref 98–111)
Chloride: 126 mmol/L — ABNORMAL HIGH (ref 98–111)
Creatinine, Ser: 2.27 mg/dL — ABNORMAL HIGH (ref 0.61–1.24)
Creatinine, Ser: 2.04 mg/dL — ABNORMAL HIGH (ref 0.61–1.24)
Creatinine, Ser: 1.6 mg/dL — ABNORMAL HIGH (ref 0.61–1.24)
Creatinine, Ser: 1.53 mg/dL — ABNORMAL HIGH (ref 0.61–1.24)
Creatinine, Ser: 1.46 mg/dL — ABNORMAL HIGH (ref 0.61–1.24)
Creatinine, Ser: 1.43 mg/dL — ABNORMAL HIGH (ref 0.61–1.24)
GFR, Estimated: 33 mL/min — ABNORMAL LOW (ref >60)
GFR, Estimated: 37 mL/min — ABNORMAL LOW (ref >60)
GFR, Estimated: 50 mL/min — ABNORMAL LOW (ref >60)
GFR, Estimated: 53 mL/min — ABNORMAL LOW (ref >60)
GFR, Estimated: 56 mL/min — ABNORMAL LOW (ref >60)
GFR, Estimated: 57 mL/min — ABNORMAL LOW (ref >60)
Glucose, Bld: 726 mg/dL (ref 70–99)
Glucose, Bld: 567 mg/dL (ref 70–99)
Glucose, Bld: 318 mg/dL — ABNORMAL HIGH (ref 70–99)
Glucose, Bld: 277 mg/dL — ABNORMAL HIGH (ref 70–99)
Glucose, Bld: 241 mg/dL — ABNORMAL HIGH (ref 70–99)
Glucose, Bld: 237 mg/dL — ABNORMAL HIGH (ref 70–99)
Potassium: 4 mmol/L (ref 3.5–5.1)
Potassium: 3.7 mmol/L (ref 3.5–5.1)
Potassium: 3.3 mmol/L — ABNORMAL LOW (ref 3.5–5.1)
Potassium: 3.3 mmol/L — ABNORMAL LOW (ref 3.5–5.1)
Potassium: 3.3 mmol/L — ABNORMAL LOW (ref 3.5–5.1)
Potassium: 5.3 mmol/L — ABNORMAL HIGH (ref 3.5–5.1)
Sodium: 151 mmol/L — ABNORMAL HIGH (ref 135–145)
Sodium: 155 mmol/L — ABNORMAL HIGH (ref 135–145)
Sodium: 162 mmol/L (ref 135–145)
Sodium: 163 mmol/L (ref 135–145)
Sodium: 164 mmol/L (ref 135–145)
Sodium: 159 mmol/L — ABNORMAL HIGH (ref 135–145)

## 2023-01-13 LAB — RESPIRATORY PANEL BY PCR

## 2023-01-13 LAB — VITAMIN D 25 HYDROXY (VIT D DEFICIENCY, FRACTURES): Vit D, 25-Hydroxy: 19.59 ng/mL — ABNORMAL LOW (ref 30–100)

## 2023-01-13 LAB — PROCALCITONIN: Procalcitonin: 3.79 ng/mL

## 2023-01-13 LAB — HIV ANTIBODY (ROUTINE TESTING W REFLEX): HIV Screen 4th Generation wRfx: NONREACTIVE

## 2023-01-13 LAB — RETICULOCYTES
Immature Retic Fract: 3.4 % (ref 2.3–15.9)
RBC.: 5.28 MIL/uL (ref 4.22–5.81)
Retic Count, Absolute: 70.8 10*3/uL (ref 19.0–186.0)
Retic Ct Pct: 1.3 % (ref 0.4–3.1)

## 2023-01-13 LAB — CBC WITH DIFFERENTIAL/PLATELET
Abs Immature Granulocytes: 0.11 10*3/uL — ABNORMAL HIGH (ref 0.00–0.07)
Basophils Absolute: 0.1 10*3/uL (ref 0.0–0.1)
Basophils Relative: 0 %
Eosinophils Absolute: 0 10*3/uL (ref 0.0–0.5)
Eosinophils Relative: 0 %
HCT: 48.6 % (ref 39.0–52.0)
Hemoglobin: 16.2 g/dL (ref 13.0–17.0)
Immature Granulocytes: 1 %
Lymphocytes Relative: 4 %
Lymphs Abs: 0.9 10*3/uL (ref 0.7–4.0)
MCH: 31.4 pg (ref 26.0–34.0)
MCHC: 33.3 g/dL (ref 30.0–36.0)
MCV: 94.2 fL (ref 80.0–100.0)
Monocytes Absolute: 0.8 10*3/uL (ref 0.1–1.0)
Monocytes Relative: 4 %
Neutro Abs: 17.9 10*3/uL — ABNORMAL HIGH (ref 1.7–7.7)
Neutrophils Relative %: 91 %
Platelets: 238 10*3/uL (ref 150–400)
RBC: 5.16 MIL/uL (ref 4.22–5.81)
RDW: 12.1 % (ref 11.5–15.5)
WBC: 19.8 10*3/uL — ABNORMAL HIGH (ref 4.0–10.5)
nRBC: 0 % (ref 0.0–0.2)

## 2023-01-13 LAB — GLUCOSE, CAPILLARY
Glucose-Capillary: 182 mg/dL — ABNORMAL HIGH (ref 70–99)
Glucose-Capillary: 189 mg/dL — ABNORMAL HIGH (ref 70–99)
Glucose-Capillary: 201 mg/dL — ABNORMAL HIGH (ref 70–99)
Glucose-Capillary: 202 mg/dL — ABNORMAL HIGH (ref 70–99)
Glucose-Capillary: 226 mg/dL — ABNORMAL HIGH (ref 70–99)
Glucose-Capillary: 229 mg/dL — ABNORMAL HIGH (ref 70–99)
Glucose-Capillary: 245 mg/dL — ABNORMAL HIGH (ref 70–99)
Glucose-Capillary: 254 mg/dL — ABNORMAL HIGH (ref 70–99)
Glucose-Capillary: 277 mg/dL — ABNORMAL HIGH (ref 70–99)
Glucose-Capillary: 314 mg/dL — ABNORMAL HIGH (ref 70–99)
Glucose-Capillary: 376 mg/dL — ABNORMAL HIGH (ref 70–99)
Glucose-Capillary: 378 mg/dL — ABNORMAL HIGH (ref 70–99)
Glucose-Capillary: 411 mg/dL — ABNORMAL HIGH (ref 70–99)
Glucose-Capillary: 412 mg/dL — ABNORMAL HIGH (ref 70–99)
Glucose-Capillary: 435 mg/dL — ABNORMAL HIGH (ref 70–99)
Glucose-Capillary: 440 mg/dL — ABNORMAL HIGH (ref 70–99)
Glucose-Capillary: 486 mg/dL — ABNORMAL HIGH (ref 70–99)
Glucose-Capillary: 498 mg/dL — ABNORMAL HIGH (ref 70–99)
Glucose-Capillary: 546 mg/dL (ref 70–99)
Glucose-Capillary: 556 mg/dL (ref 70–99)
Glucose-Capillary: 581 mg/dL (ref 70–99)
Glucose-Capillary: >600 mg/dL (ref 70–99)
Glucose-Capillary: >600 mg/dL (ref 70–99)

## 2023-01-13 LAB — IRON AND TIBC
Iron: 39 ug/dL — ABNORMAL LOW (ref 45–182)
Saturation Ratios: 14 % — ABNORMAL LOW (ref 17.9–39.5)
TIBC: 284 ug/dL (ref 250–450)
UIBC: 245 ug/dL

## 2023-01-13 LAB — URINE CULTURE: Culture: NO GROWTH

## 2023-01-13 LAB — OSMOLALITY
Osmolality: 431 mOsm/kg (ref 275–295)
Osmolality: 398 mOsm/kg (ref 275–295)

## 2023-01-13 LAB — CREATININE, URINE, RANDOM: Creatinine, Urine: 56 mg/dL

## 2023-01-13 LAB — PHOSPHORUS
Phosphorus: 3 mg/dL (ref 2.5–4.6)
Phosphorus: 1.9 mg/dL — ABNORMAL LOW (ref 2.5–4.6)
Phosphorus: 1.1 mg/dL — ABNORMAL LOW (ref 2.5–4.6)
Phosphorus: 5.9 mg/dL — ABNORMAL HIGH (ref 2.5–4.6)

## 2023-01-13 LAB — MAGNESIUM
Magnesium: 3.7 mg/dL — ABNORMAL HIGH (ref 1.7–2.4)
Magnesium: 3.5 mg/dL — ABNORMAL HIGH (ref 1.7–2.4)

## 2023-01-13 LAB — TRIGLYCERIDES: Triglycerides: 83 mg/dL (ref <150)

## 2023-01-13 LAB — SODIUM, URINE, RANDOM: Sodium, Ur: <10 mmol/L

## 2023-01-13 LAB — HEPATITIS PANEL, ACUTE
HCV Ab: NONREACTIVE
Hep A IgM: NONREACTIVE
Hep B C IgM: NONREACTIVE
Hepatitis B Surface Ag: NONREACTIVE

## 2023-01-13 LAB — AMMONIA: Ammonia: 33 umol/L (ref 9–35)

## 2023-01-13 LAB — LACTIC ACID, PLASMA
Lactic Acid, Venous: 2.3 mmol/L (ref 0.5–1.9)
Lactic Acid, Venous: 1.6 mmol/L (ref 0.5–1.9)

## 2023-01-13 LAB — LIPASE, BLOOD
Lipase: 166 U/L — ABNORMAL HIGH (ref 11–51)
Lipase: 1012 U/L — ABNORMAL HIGH (ref 11–51)

## 2023-01-13 LAB — MRSA NEXT GEN BY PCR, NASAL: MRSA by PCR Next Gen: NOT DETECTED

## 2023-01-13 LAB — TROPONIN I (HIGH SENSITIVITY): Troponin I (High Sensitivity): 44 ng/L — ABNORMAL HIGH (ref <18)

## 2023-01-13 LAB — TSH: TSH: 0.357 u[IU]/mL (ref 0.350–4.500)

## 2023-01-13 LAB — FERRITIN: Ferritin: 2484 ng/mL — ABNORMAL HIGH (ref 24–336)

## 2023-01-13 LAB — BETA-HYDROXYBUTYRIC ACID
Beta-Hydroxybutyric Acid: 3.28 mmol/L — ABNORMAL HIGH (ref 0.05–0.27)
Beta-Hydroxybutyric Acid: 0.95 mmol/L — ABNORMAL HIGH (ref 0.05–0.27)

## 2023-01-13 LAB — VITAMIN B12: Vitamin B-12: 3361 pg/mL — ABNORMAL HIGH (ref 180–914)

## 2023-01-13 LAB — OSMOLALITY, URINE: Osmolality, Ur: 656 mOsm/kg (ref 300–900)

## 2023-01-13 LAB — ETHANOL: Alcohol, Ethyl (B): <10 mg/dL (ref <10)

## 2023-01-13 LAB — FOLATE: Folate: 21.2 ng/mL (ref >5.9)

## 2023-01-13 LAB — CK: Total CK: 439 U/L — ABNORMAL HIGH (ref 49–397)

## 2023-01-13 MED ORDER — SODIUM PHOSPHATES 45 MMOLE/15ML IV SOLN
15.0000 mmol | Freq: Once | INTRAVENOUS | Status: AC
  Administered 2023-01-13: 15 mmol via INTRAVENOUS
  Filled 2023-01-13: qty 5

## 2023-01-13 MED ORDER — SODIUM CHLORIDE 0.9 % IV BOLUS
2000.0000 mL | Freq: Once | INTRAVENOUS | Status: AC
  Administered 2023-01-13: 2000 mL via INTRAVENOUS

## 2023-01-13 MED ORDER — SODIUM CHLORIDE 0.9 % IV SOLN: INTRAVENOUS | Status: DC

## 2023-01-13 MED ORDER — LORAZEPAM 1 MG PO TABS: 1.0000 mg | ORAL_TABLET | ORAL | Status: AC | PRN

## 2023-01-13 MED ORDER — THIAMINE HCL 100 MG/ML IJ SOLN
100.0000 mg | Freq: Every day | INTRAMUSCULAR | Status: DC
  Administered 2023-01-13 – 2023-01-14 (×2): 100 mg via INTRAVENOUS
  Filled 2023-01-13 (×2): qty 2

## 2023-01-13 MED ORDER — VANCOMYCIN HCL 1500 MG/300ML IV SOLN
1500.0000 mg | INTRAVENOUS | Status: DC
  Administered 2023-01-13 – 2023-01-14 (×2): 1500 mg via INTRAVENOUS
  Filled 2023-01-13 (×2): qty 300

## 2023-01-13 MED ORDER — SODIUM CHLORIDE 0.9 % IV SOLN
2.0000 g | Freq: Two times a day (BID) | INTRAVENOUS | Status: DC
  Administered 2023-01-13 – 2023-01-15 (×5): 2 g via INTRAVENOUS
  Filled 2023-01-13 (×5): qty 12.5

## 2023-01-13 MED ORDER — THIAMINE MONONITRATE 100 MG PO TABS: 100.0000 mg | ORAL_TABLET | Freq: Every day | ORAL | Status: DC

## 2023-01-13 MED ORDER — FOLIC ACID 1 MG PO TABS
1.0000 mg | ORAL_TABLET | Freq: Every day | ORAL | Status: DC
  Administered 2023-01-13: 1 mg via ORAL
  Filled 2023-01-13: qty 1

## 2023-01-13 MED ORDER — SODIUM PHOSPHATES 45 MMOLE/15ML IV SOLN: 30.0000 mmol | Freq: Once | INTRAVENOUS | Status: DC

## 2023-01-13 MED ORDER — LORAZEPAM 2 MG/ML IJ SOLN
1.0000 mg | INTRAMUSCULAR | Status: AC | PRN
  Administered 2023-01-14 – 2023-01-15 (×2): 2 mg via INTRAVENOUS
  Filled 2023-01-13 (×2): qty 1

## 2023-01-13 MED ORDER — DEXTROSE 5 % IV SOLN: INTRAVENOUS | Status: DC

## 2023-01-13 MED ORDER — INSULIN STARTER KIT- PEN NEEDLES (ENGLISH)
1.0000 | Freq: Once | Status: AC
  Administered 2023-01-13: 1
  Filled 2023-01-13: qty 1

## 2023-01-13 MED ORDER — LIVING WELL WITH DIABETES BOOK
Freq: Once | Status: AC
  Filled 2023-01-13: qty 1

## 2023-01-13 MED ORDER — ADULT MULTIVITAMIN W/MINERALS CH
1.0000 | ORAL_TABLET | Freq: Every day | ORAL | Status: DC
  Filled 2023-01-13: qty 1

## 2023-01-13 MED ORDER — SODIUM CHLORIDE 0.45 % IV SOLN: INTRAVENOUS | Status: DC

## 2023-01-13 MED ORDER — DEXTROSE-NACL 5-0.45 % IV SOLN: INTRAVENOUS | Status: DC

## 2023-01-13 MED ORDER — POTASSIUM PHOSPHATES 15 MMOLE/5ML IV SOLN
15.0000 mmol | Freq: Once | INTRAVENOUS | Status: DC
  Filled 2023-01-13: qty 5

## 2023-01-13 MED ORDER — POTASSIUM PHOSPHATES 15 MMOLE/5ML IV SOLN
45.0000 mmol | Freq: Once | INTRAVENOUS | Status: AC
  Administered 2023-01-13: 45 mmol via INTRAVENOUS
  Filled 2023-01-13: qty 15

## 2023-01-13 NOTE — Consult Note (Signed)
[facility] Gastroenterology Consult  Referring Provider: No ref. provider found Primary Care Physician:  [name], MD Primary Gastroenterologist: [facility], unassigned  Reason for Consultation: Pancreatitis  SUBJECTIVE:   HPI: Vincent Thompson is a 57 y.o. male with past medical history significant for alcohol use, hypertension and hyperlipidemia.  Presented to [facility] on 01/12/2023 with confusion.  Per discussion with patient's spouse at bedside today, patient was somewhat lethargic on 01/11/2023 though became confused on 01/12/2023.  She noted that he drinks roughly 2-3 alcoholic beverages per day, though has not consumed alcohol in the last 5 days.  No history of pancreatitis in the past.  No prior abdominal surgeries.  Labs on presentation showed glucose greater than 1200, AST/ALT 66/96, alkaline phosphatase 76, total bilirubin 1.8, lipase 1194, triglyceride level 83.  CT scan of chest, abdomen and pelvis on 01/12/2023 showed hepatic steatosis, hyperdense gallbladder, mild stranding and edema around the pancreatic head, mild thickening of the duodenum.  Last colonoscopy roughly 4 years prior through [place], unremarkable per family, no report available.  No prior EGD.  Patient awake and alert on my evaluation, nontoxic in appearance.  Oriented to person and time, not oriented to location.  Does appear somewhat confused.  Review of systems limited.  Past Medical History:  Diagnosis Date   Hyperlipemia    Hypertension    History reviewed. No pertinent surgical history. Prior to Admission medications   Medication Sig Start Date End Date Taking? Authorizing Provider  albuterol (VENTOLIN HFA) 108 (90 Base) MCG/ACT inhaler Inhale 2 puffs into the lungs every 4 (four) hours as needed for wheezing or shortness of breath. 10/11/21  Yes [provider]  amLODipine-benazepril (LOTREL) 10-40 MG capsule Take 1 capsule by mouth daily.   Yes [provider]   escitalopram (LEXAPRO) 20 MG tablet Take 1 tablet by mouth daily. 09/09/22  Yes [provider]  Glucosamine 500 MG CAPS Take 1 tablet by mouth daily.   Yes [provider]  hydrochlorothiazide (HYDRODIURIL) 25 MG tablet Take 25 mg by mouth daily.   Yes [provider]  simvastatin (ZOCOR) 20 MG tablet Take 20 mg by mouth every morning.   Yes [provider]  vitamin B-12 (CYANOCOBALAMIN) 100 MCG tablet Take 100 mcg by mouth.   Yes [provider]   Current Facility-Administered Medications  Medication Dose Route Frequency Provider Last Rate Last Admin   ceFEPIme (MAXIPIME) 2 g in sodium chloride 0.9 % 100 mL IVPB  2 g Intravenous Q12H Poindexter, Leann T, RPH   Stopped at 01/13/23 1246   Chlorhexidine Gluconate Cloth 2 % PADS 6 each  6 each Topical Daily Doutova, Anastassia, MD   6 each at 01/13/23 0946   dextrose 5 %-0.45 % sodium chloride infusion   Intravenous Continuous Kc, Ramesh, MD 100 mL/hr at 01/13/23 1131 New Bag at 01/13/23 1131   dextrose 50 % solution 0-50 mL  0-50 mL Intravenous PRN Toy Baker, MD       folic acid (FOLVITE) tablet 1 mg  1 mg Oral Daily Kc, Ramesh, MD   1 mg at 01/13/23 1058   insulin regular, human (MYXREDLIN) 100 units/ 100 mL infusion   Intravenous Continuous Doutova, Anastassia, MD 9 mL/hr at 01/13/23 1128 Infusion Verify at 01/13/23 1128   lactated ringers infusion   Intravenous Continuous Toy Baker, MD   Stopped at 01/13/23 0102   LORazepam (ATIVAN) tablet 1-4 mg  1-4 mg Oral Q1H PRN Antonieta Pert, MD  Or   LORazepam (ATIVAN) injection 1-4 mg  1-4 mg Intravenous Q1H PRN Kc, Ramesh, MD       metroNIDAZOLE (FLAGYL) IVPB 500 mg  500 mg Intravenous Q12H Doutova, Anastassia, MD 100 mL/hr at 01/13/23 1249 500 mg at 01/13/23 1249   multivitamin with minerals tablet 1 tablet  1 tablet Oral Daily Kc, Maren Beach, MD       Oral care mouth rinse  15 mL Mouth Rinse PRN Doutova, Anastassia, MD       potassium  PHOSPHATE 45 mmol in dextrose 5 % 500 mL infusion  45 mmol Intravenous Once Kc, Ramesh, MD       thiamine (VITAMIN B1) tablet 100 mg  100 mg Oral Daily Kc, Ramesh, MD       Or   thiamine (VITAMIN B1) injection 100 mg  100 mg Intravenous Daily Kc, Ramesh, MD   100 mg at 01/13/23 1058   vancomycin variable dose per unstable renal function (pharmacist dosing)   Does not apply See admin instructions Toy Baker, MD       Allergies as of 01/12/2023   (No Known Allergies)   History reviewed. No pertinent family history. Social History   Socioeconomic History   Marital status: Married    Spouse name: Not on file   Number of children: Not on file   Years of education: Not on file   Highest education level: Not on file  Occupational History   Not on file  Tobacco Use   Smoking status: Never   Smokeless tobacco: Never  Substance and Sexual Activity   Alcohol use: No   Drug use: No   Sexual activity: Never  Other Topics Concern   Not on file  Social History Narrative   Not on file   Social Determinants of Health   Financial Resource Strain: Not on file  Food Insecurity: Not on file  Transportation Needs: Not on file  Physical Activity: Not on file  Stress: Not on file  Social Connections: Not on file  Intimate Partner Violence: Not on file   Review of Systems:  Review of Systems  Respiratory:  Negative for shortness of breath.   Cardiovascular:  Negative for chest pain.  Gastrointestinal:  Negative for abdominal pain, constipation, diarrhea, nausea and vomiting.    OBJECTIVE:   Temp:  [97.6 F (36.4 C)-100.2 F (37.9 C)] 99.1 F (37.3 C) (01/22 1200) Pulse Rate:  [36-127] 113 (01/22 1300) Resp:  [14-28] 28 (01/22 1300) BP: (118-170)/(87-111) 150/106 (01/22 1300) SpO2:  [91 %-100 %] 91 % (01/22 1300) Weight:  [81.5 kg-81.6 kg] 81.5 kg (01/21 2316) Last BM Date :  (PTA) Physical Exam Constitutional:      General: He is not in acute distress.    Appearance: He  is not ill-appearing, toxic-appearing or diaphoretic.  Cardiovascular:     Rate and Rhythm: Regular rhythm. Tachycardia present.  Pulmonary:     Effort: No respiratory distress.     Breath sounds: Normal breath sounds.  Abdominal:     General: Bowel sounds are normal. There is no distension.     Palpations: Abdomen is soft.     Tenderness: There is no abdominal tenderness. There is no guarding.  Musculoskeletal:     Right lower leg: No edema.     Left lower leg: No edema.  Skin:    General: Skin is warm and dry.  Neurological:     Mental Status: He is alert.     Comments: Oriented to person  and time.    Labs: Recent Labs    01/12/23 1423 01/12/23 1434 01/12/23 1701 01/13/23 0959  WBC 18.7*  --   --  19.8*  HGB 17.0 18.0* 16.0 16.2  HCT 53.4* 53.0* 47.0 48.6  PLT 325  --   --  238   BMET Recent Labs    01/13/23 0028 01/13/23 0253 01/13/23 1140  NA 151* 155* 163*  K 4.0 3.7 3.3*  CL 110 118* 126*  CO2 25 26 27   GLUCOSE 726* 567* 277*  BUN 66* 64* 52*  CREATININE 2.27* 2.04* 1.53*  CALCIUM 12.4* 12.4* 12.1*   LFT Recent Labs    01/12/23 1423  PROT 9.7*  ALBUMIN 4.6  AST 66*  ALT 96*  ALKPHOS 76  BILITOT 1.8*   PT/INR Recent Labs    01/12/23 1423  LABPROT 15.9*  INR 1.3*   Diagnostic imaging: 01/14/23 RENAL  Result Date: 01/13/2023 CLINICAL DATA:  Acute kidney injury EXAM: RENAL / URINARY TRACT ULTRASOUND COMPLETE COMPARISON:  CT 01/12/2023 FINDINGS: Right Kidney: Renal measurements: 10.9 x 5.5 x 6.3 cm = volume: 199 mL. Echogenicity within normal limits. No mass or hydronephrosis visualized. Left Kidney: Renal measurements: 12.3 x 6.4 x 7.3 cm = volume: 298 mL. Echogenicity within normal limits. No hydronephrosis. 3.4 x 2.8 x 2.6 cm cyst in the lower pole with mild septation. No follow-up recommended. Bladder: Appears normal for degree of bladder distention. Other: None. IMPRESSION: No hydronephrosis. Borderline mild renal enlargement which could go along  with acute nephritis. Electronically Signed   By: 01/14/2023 M.D.   On: 01/13/2023 08:01   CT Head Wo Contrast  Result Date: 01/12/2023 CLINICAL DATA:  Mental status change, unknown cause EXAM: CT HEAD WITHOUT CONTRAST TECHNIQUE: Contiguous axial images were obtained from the base of the skull through the vertex without intravenous contrast. RADIATION DOSE REDUCTION: This exam was performed according to the departmental dose-optimization program which includes automated exposure control, adjustment of the mA and/or kV according to patient size and/or use of iterative reconstruction technique. COMPARISON:  None Available. FINDINGS: Brain: No evidence of acute infarction, hemorrhage, hydrocephalus, extra-axial collection or mass lesion/mass effect. Mild patchy white matter hypodensities, nonspecific but compatible with chronic microvascular ischemic disease. Vascular: No hyperdense vessel identified. Skull: No acute fracture. Sinuses/Orbits: Mild paranasal sinus mucosal thickening. No acute orbital findings. Other: No mastoid effusions. IMPRESSION: No evidence of acute intracranial abnormality. Electronically Signed   By: 01/14/2023 M.D.   On: 01/12/2023 16:56   CT CHEST ABDOMEN PELVIS WO CONTRAST  Result Date: 01/12/2023 CLINICAL DATA:  Sepsis EXAM: CT CHEST, ABDOMEN AND PELVIS WITHOUT CONTRAST TECHNIQUE: Multidetector CT imaging of the chest, abdomen and pelvis was performed following the standard protocol without IV contrast. RADIATION DOSE REDUCTION: This exam was performed according to the departmental dose-optimization program which includes automated exposure control, adjustment of the mA and/or kV according to patient size and/or use of iterative reconstruction technique. COMPARISON:  None Available. FINDINGS: CT CHEST FINDINGS Cardiovascular: Calcific atherosclerosis of the coronary arteries. Normal heart size. No pericardial effusion. Mediastinum/Nodes: No enlarged mediastinal, hilar, or  axillary lymph nodes. Thyroid gland, trachea, and esophagus demonstrate no significant findings. Lungs/Pleura: Lungs are clear. No pleural effusion or pneumothorax. Musculoskeletal: No chest wall mass or suspicious bone lesions identified. CT ABDOMEN PELVIS FINDINGS Hepatobiliary: Diffuse low-attenuation liver, compatible with hepatic steatosis. No visible mass lesion. Gallbladder is hyperdense but otherwise unremarkable. Pancreas: Mild stranding/edema around the pancreas, particularly the pancreatic head. Spleen: Normal in size without  focal abnormality. Adrenals/Urinary Tract: Adrenal glands are unremarkable. No hydronephrosis. Approximately 2.6 cm hypoattenuating left renal lesion with Hounsfield unit measurements that are indeterminate in areas. Stomach/Bowel: Mild thickening of the duodenum in the region the pancreas. Otherwise, unremarkable appearance of bowel loops. No dilated bowel loops to suggest obstruction. Normal appendix. Vascular/Lymphatic: No significant vascular findings are present. No enlarged abdominal or pelvic lymph nodes. Reproductive: Prostate is unremarkable. Other: Mild laxity anterior abdominal wall.  No significant ascites. Musculoskeletal: L5-S1 degenerative change. IMPRESSION: 1. Mild stranding/edema around the pancreas, concerning for pancreatitis. No discrete, drainable fluid collection. Correlate with lipase. 2. Mild thickening of the proximal duodenum in the region of the pancreas, likely secondary/reactive change. 3. Hepatic steatosis. 4. Indeterminate 2.6 cm left renal lesion. This may represent a cyst but recommend non-urgent follow-up renal ultrasound to better characterize. Electronically Signed   By: Feliberto Harts M.D.   On: 01/12/2023 16:55   DG Chest Port 1 View  Result Date: 01/12/2023 CLINICAL DATA:  Cough and upper respiratory infection. EXAM: PORTABLE CHEST 1 VIEW COMPARISON:  07/31/2020 FINDINGS: 1417 hours. Low volume film. The lungs are clear without focal  pneumonia, edema, pneumothorax or pleural effusion. The cardiopericardial silhouette is within normal limits for size. The visualized bony structures of the thorax are unremarkable. Telemetry leads overlie the chest. IMPRESSION: Low volume film without acute cardiopulmonary findings. Electronically Signed   By: Kennith Center M.D.   On: 01/12/2023 14:39    IMPRESSION: Acute interstitial edematous pancreatitis Alcohol use prior to admission Diabetic ketoacidosis Hypertension Hyperlipidemia, normal triglyceride level  PLAN: -Given presentation and history, suspect alcohol may be a etiology of pancreatitis -Continue supportive care for diabetic ketoacidosis per ICU team -Okay for low-fat diet from GI standpoint when able to tolerate oral intake from DKA standpoint -Continue IV fluids -Abdominal pain appears minimal at this point, would avoid any narcotics as patient has some confusion -Monitor for signs of alcohol withdrawal -Trend liver enzyme panel -[facility] will follow   LOS: 1 day   Liliane Shi, [facility]astroenterology

## 2023-01-13 NOTE — Consult Note (Signed)
Renal Service Consult Note [facility]  Vincent Thompson 01/13/2023 Vincent Blazing, MD Requesting Physician: Dr. Lupita Thompson  Reason for Consult: AKI and hypernatremia and other electrolyte issues HPI: The patient is a 57 y.o. year-old w/ PMH as below who presented w/ confusion and lethargy on 1/21. Pt l/w family. In ED found to have severely high blood sugar and lipase. Pt was not aware he was a diabetic. In ED BS > 1200, creat 3.6, LA 5.9, WBC 18K, BHB > 8. Lipase 1194, trop 44. CXR neg, CT abd mild pancreatitis w/ secondary duodenitis. Fatty liver. Pt rec'd 2L bolus and was started on IV zosyn and vanc, IV insulin and cefepime and flagyl. Today labs showed worsening hypernatremia, BS's in the 300s, a bit less confused today, denies abd pain / CP. Serum phos was low at 1.9 > 1.1, repleted w/ Kphos 45 mmoles. Calcium high in 11-13 range. We are asked to see for renal failure, hypophos, hypercalcemia, hypernatremia.   Pt seen in ICU, not giving any helpful history. Pt is confused but is responsive to simple questions.   ROS - n/a   Past Medical History  Past Medical History:  Diagnosis Date   Hyperlipemia    Hypertension    Past Surgical History History reviewed. No pertinent surgical history. Family History History reviewed. No pertinent family history. Social History  reports that he has never smoked. He has never used smokeless tobacco. He reports that he does not drink alcohol and does not use drugs. Allergies No Known Allergies Home medications Prior to Admission medications   Medication Sig Start Date End Date Taking? Authorizing Provider  albuterol (VENTOLIN HFA) 108 (90 Base) MCG/ACT inhaler Inhale 2 puffs into the lungs every 4 (four) hours as needed for wheezing or shortness of breath. 10/11/21  Yes [provider]  amLODipine-benazepril (LOTREL) 10-40 MG capsule Take 1 capsule by mouth daily.   Yes [provider]  escitalopram (LEXAPRO) 20 MG  tablet Take 1 tablet by mouth daily. 09/09/22  Yes [provider]  Glucosamine 500 MG CAPS Take 1 tablet by mouth daily.   Yes [provider]  hydrochlorothiazide (HYDRODIURIL) 25 MG tablet Take 25 mg by mouth daily.   Yes [provider]  simvastatin (ZOCOR) 20 MG tablet Take 20 mg by mouth every morning.   Yes [provider]  vitamin B-12 (CYANOCOBALAMIN) 100 MCG tablet Take 100 mcg by mouth.   Yes [provider]     Vitals:   01/13/23 1300 01/13/23 1400 01/13/23 1500 01/13/23 1600  BP: (!) 150/106 (!) 134/104 (!) 137/102 (!) 142/106  Pulse: (!) 113 (!) 110 (!) 110 (!) 108  Resp: (!) 28 (!) 28 (!) 29 (!) 28  Temp:      TempSrc:      SpO2: 91% 95% 94% 95%  Weight:      Height:       Exam Gen lethargic, hands in mittens Pulse if soft and rapid No rash, cyanosis or gangrene Sclera anicteric, throat clear  No jvd or bruits, flat neck veins Chest clear bilat to bases, no rales/ wheezing RRR no MRG Abd soft ntnd no mass or ascites +bs GU normal male MS no joint effusions or deformity Ext no LE or UE edema, no wounds or ulcers Neuro as above      Home meds include - amlodipine-benazepril, lexapro, simvastatin, vits/ supps/ prns     Na 134 --- >> 155 --> 164 today as BS was corrected  BS 1200 --> 955 --> 567 --> 241 today    BHB 8.00 --> 3.28 --> 0.95    BUN 90 -> 64 --> 51    Creat 3.62 --> 2.04 --> 1.46    Ca 12.2 --> 12.4 --> 11.6    Alb 4.6 -- > ??    Phos 1.9 --> 1.1     Mg 3.5     UA >500 bs, neg protein, 0-5 wbc/ rbc, many bact    I/O 2.8 L in and 3.6 L out, net neg 862 cc     US renal - 10.9/ 12.3 cm kidneys w/o hydro  Assessment/ Plan: Hypernatremia - Na+ 164 in setting of severe new onset DKA. On exam pt is still quite vol depleted overall. See #2. For high Na+ will attempt to replace 1/2 of the total H2O defecit (8.2 L) over the next 24 hrs = D5W at 167 cc/hr.  Hypovolemia - by exam is still probably 3-5 L  down on fluids. Correct this is isotonic saline 2 L bolus and then 125 cc/hr.  Hypophosphatemia - not sure cause, could be caused by high Ca, poor diet, high etoh Pancreatitis - may be related to etoh Hypercalcemia - not sure cause >  get vit D, pth , pth related protein and 1,25 vit D levels AKI - creat 3.6 on admission, down to 1.46 today. Much better. Prob due to vol depletion.  New onset DM / severe DKA - per pmd AMS - multifactorial      Rob Vincent Broaddus  MD 01/13/2023, 4:37 PM Recent Labs  Lab 01/12/23 1423 01/12/23 1434 01/12/23 1701 01/12/23 1932 01/13/23 0253 01/13/23 0959 01/13/23 1140 01/13/23 1354  HGB 17.0   < > 16.0  --   --  16.2  --   --   ALBUMIN 4.6  --   --   --   --   --   --   --   CALCIUM 12.2*   < >  --    < > 12.4*  --  12.1* 11.6*  PHOS  --   --   --    < > 1.9*  --  1.1*  --   CREATININE 3.61*   < >  --    < > 2.04*  --  1.53* 1.46*  K 5.4*   < > 4.4   < > 3.7  --  3.3* 3.3*   < > = values in this interval not displayed.   Inpatient medications:  Chlorhexidine Gluconate Cloth  6 each Topical Daily   folic acid  1 mg Oral Daily   insulin starter kit- pen needles  1 kit Other Once   multivitamin with minerals  1 tablet Oral Daily   thiamine  100 mg Oral Daily   Or   thiamine  100 mg Intravenous Daily    ceFEPime (MAXIPIME) IV Stopped (01/13/23 1246)   dextrose 5 % and 0.45% NaCl 100 mL/hr at 01/13/23 1131   insulin 12 Units/hr (01/13/23 1506)   lactated ringers Stopped (01/13/23 0102)   metronidazole Stopped (01/13/23 1444)   potassium PHOSPHATE IVPB (in mmol) 45 mmol (01/13/23 1511)   vancomycin     dextrose, LORazepam **OR** LORazepam, mouth rinse

## 2023-01-13 NOTE — Progress Notes (Addendum)
Pharmacy Antibiotic Note  Vincent Thompson is a 57 y.o. male admitted from [place] on 01/12/2023 with sepsis.  Pharmacy has been consulted for Vancomycin and Zosyn dosing.  Pharmacy had been consulted for Vancomycin previously at [facility] (see note by Harold Barban, PharmD).  Patient received Vancomycin 1500mg  total IV at [facility] as well as Zosyn 3.375gm IV x 1.  SCr trending down 3.61 (1/21) > 3.37 (1/21) > 2.61 (1/21pm)  Plan: Cefepime 2gm IV q12h Vancomycin subsequent dosing as indicated per random vancomycin level until renal function stable and/or improved, at which time scheduled dosing can be considered.  With current CrCl, patient would not need another vancomycin dose for approx 48 hrs after initial dose Metronidazole per MD Next Bmet ordered for 10am on 01/13/23 - will f/u  F/u culture results and sensitvities Follow renal function   Height: 5' 9.5" (176.5 cm) Weight: 81.5 kg (179 lb 10.8 oz) IBW/kg (Calculated) : 71.85  Temp (24hrs), Avg:98.9 F (37.2 C), Min:97.9 F (36.6 C), Max:100.2 F (37.9 C)  Recent Labs  Lab 01/12/23 1423 01/12/23 1600 01/12/23 1602 01/12/23 1932  WBC 18.7*  --   --   --   CREATININE 3.61* 3.37*  --  2.61*  LATICACIDVEN 5.9*  --  6.8* 2.5*    Estimated Creatinine Clearance: 31.8 mL/min (A) (by C-G formula based on SCr of 2.61 mg/dL (H)).    No Known Allergies  Antimicrobials this admission: 1/21 Zosyn x 1 1/21 Vancomycin >>   1/22 Cefepime >> 1/22 Metronidazole >>  Dose adjustments this admission:   Microbiology results: 1/21 BCx:   1/21 UCx:       Thank you for allowing pharmacy to be a part of this patient's care.  Everette Rank, PharmD 01/13/2023 12:28 AM

## 2023-01-13 NOTE — Progress Notes (Signed)
Patient's wife and daughter at bedside. Per patient's wife, patient is an everyday liquor drinker of vodka (amount unknown). Informed family we will be monitoring for withdrawal symptoms.

## 2023-01-13 NOTE — Hospital Course (Addendum)
57 year old male with hypertension elevated LFTs anxiety hyperlipidemia who has been having cough and upper respiratory symptoms for 2 and half weeks-had tested negative for COVID a week ago, presented with lethargy/confusion, tired. In the ED low-grade fever 100.2 tachycardic in the 120s, stable BP and oxygen saturation Labs obtained that showed blood sugar more than 1200, hyperkalemia, AKI w/ creatinine 3.6, transaminitis lactic acidosis 5.9 leukocytosis 18.7 BHB more than 8 COVID-19 influenza negative, lipase elevated 1194,CK4 39 troponin 44.  Normal folate and elevated B12, iron 39 but ferritin high at 2484. Imaging: Chest x-ray low lung volumes, CT chest abdomen abdomen pelvis w/o: Mild stranding/edema of pancreas concerning for pancreatitis, likely reactive/secondary duodenitis, hepatic steatosis, 2.6 cm left renal lesion. RVP panel pending Patient was aggressively volume resuscitated with 2 L bolus, was given vancomycin and Zosyn, placed on IV insulin drip, cefepime Flagyl and admitted for further management Critical care Dr Tacy Learn was consulted but advised stable for [facility], The admission Due to severe electrolyte imbalance nephrology was consulted Patient managed aggressive normal saline along with D5W insulin drip phosphorus and potassium replacement.  Electrolytes had significantly improved but patient remains confused with pancreatitis distended abdomen x-ray 1/24 showed ileus.  Seen by critical care. 1/25: Much less confused, passing gas and had bowel movement. 1/27: Ongoing persistent leukocytosis abdominal distention, CT abdomen obtained with IV contrast showing fluid-filled small at this time and also in the colon seen by [name]- no concern for bowel obstruction advised to advance diet. 1/28: Transitioned to subcu insulin regimen

## 2023-01-13 NOTE — Assessment & Plan Note (Signed)
Mild in the setting of severe dehydration expect will stabilize while patient is adequately rehydrated

## 2023-01-13 NOTE — Progress Notes (Signed)
Pharmacy Antibiotic Note  Vincent Thompson is a 57 y.o. male admitted from [place] on 01/12/2023 with sepsis. Pharmacy has been consulted for Vancomycin and Zosyn dosing.  Patient presented with AKI, SCr continues to trend down.  Plan: Continue cefepime 2gm IV q12h Given improvement in SCr, start vancomycin 1500 mg IV q24h Metronidazole per MD Continue to follow renal function, cultures and clinical progress for dose adjustments and de-escalation as indicated  Height: 5' 9.5" (176.5 cm) Weight: 81.5 kg (179 lb 10.8 oz) IBW/kg (Calculated) : 71.85  Temp (24hrs), Avg:98.7 F (37.1 C), Min:97.6 F (36.4 C), Max:100.2 F (37.9 C)  Recent Labs  Lab 01/12/23 1423 01/12/23 1600 01/12/23 1602 01/12/23 1932 01/13/23 0003 01/13/23 0028 01/13/23 0253 01/13/23 0959 01/13/23 1140  WBC 18.7*  --   --   --   --   --   --  19.8*  --   CREATININE 3.61* 3.37*  --  2.61*  --  2.27* 2.04*  --  1.53*  LATICACIDVEN 5.9*  --  6.8* 2.5* 2.3*  --   --   --   --      Estimated Creatinine Clearance: 54.2 mL/min (A) (by C-G formula based on SCr of 1.53 mg/dL (H)).    No Known Allergies  Antimicrobials this admission: 1/21 Zosyn x 1 1/21 Vancomycin >>   1/22 Cefepime >> 1/22 Metronidazole >>  Microbiology results: 1/21 BCx: ngtd  1/21 UCx: pending 1/21 Resp panel: negative      Thank you for allowing pharmacy to be a part of this patient's care.  Tawnya Crook, PharmD, BCPS Clinical Pharmacist 01/13/2023 2:02 PM

## 2023-01-13 NOTE — Assessment & Plan Note (Signed)
-  SIRS criteria met with  elevated white blood cell count,   suspect in the setting of hemoconcentration    Component Value Date/Time   WBC 18.7 (H) 01/12/2023 1423   LYMPHSABS 0.5 (L) 01/12/2023 1423    tachycardia   ,  RR >20 Today's Vitals   01/12/23 2132 01/12/23 2140 01/12/23 2200 01/12/23 2316  BP:  (!) 125/105 (!) 133/111 (!) 136/99  Pulse:   (!) 118   Resp:  (!) 25 (!) 23 (!) 24  Temp: 98.9 F (37.2 C)   97.9 F (36.6 C)  TempSrc: Oral   Oral  SpO2:  100% 98%   Weight:    81.5 kg  Height:    5' 9.5" (1.765 m)  PainSc:       Body mass index is 26.15 kg/m.  This patient meets SIRS Criteria and may be septic.     The recent clinical data is shown below. Vitals:   01/12/23 2132 01/12/23 2140 01/12/23 2200 01/12/23 2316  BP:  (!) 125/105 (!) 133/111 (!) 136/99  Pulse:   (!) 118   Resp:  (!) 25 (!) 23 (!) 24  Temp: 98.9 F (37.2 C)   97.9 F (36.6 C)  TempSrc: Oral   Oral  SpO2:  100% 98%   Weight:    81.5 kg  Height:    5' 9.5" (1.765 m)      I suspect that SIRS criteria were due to ( dehydration, ) At this Time no source of infection    The patient is noted to have a lactate>4. With the current information available to me, I don't think the patient is in septic shock. The lactate>4, is related to  dehydration, and history of underlying liver disease lactic acid has been improving.  Rehydration   -Most likely source being: Source of sepsis is unknown but just for tonight continue antibiotics if blood cultures unremarkable would de-escalate     - Obtain serial lactic acid and procalcitonin level.  - Initiated IV antibiotics in ER: Antibiotics Given (last 72 hours)     Date/Time Action Medication Dose Rate   01/12/23 1500 New Bag/Given   piperacillin-tazobactam (ZOSYN) IVPB 3.375 g 3.375 g 100 mL/hr   01/12/23 1552 New Bag/Given   vancomycin (VANCOCIN) IVPB 1000 mg/200 mL premix 1,000 mg 200 mL/hr   01/12/23 1718 New Bag/Given   vancomycin (VANCOCIN) 500 mg  in sodium chloride 0.9 % 100 mL IVPB 500 mg 100 mL/hr       Will continue  on : Cefepime Vanco and metronidazole for tonight If no evidence of infection   would de-escalate - await results of blood and urine culture  - Rehydrate aggressively  Intravenous fluids were administered,         30cc/kg fluid    12:12 AM

## 2023-01-13 NOTE — Assessment & Plan Note (Signed)
Noted somewhat elevated lipase and edema around pancreas Possibly early pancreatitis.  Keep n.p.o. aggressive fluid resuscitation and continue to follow

## 2023-01-13 NOTE — Progress Notes (Signed)
PROGRESS NOTE Vincent Thompson  ZOX:[id number] DOB: 1966/02/11 DOA: 01/12/2023 PCP: [name], MD   Brief Narrative/Hospital Course: 57 year old male with hypertension elevated LFTs anxiety hyperlipidemia who has been having cough and upper respiratory symptoms for 2 and half weeks-had tested negative for COVID a week ago, presented with lethargy/confusion, tired. In the ED low-grade fever 100.2 tachycardic in the 120s, stable BP and oxygen saturation Labs obtained that showed blood sugar more than 1200, hyperkalemia, AKI w/ creatinine 3.6, transaminitis lactic acidosis 5.9 leukocytosis 18.7 BHB more than 8 COVID-19 influenza negative, lipase elevated 1194,CK4 39 troponin 44.  Normal folate and elevated B12, iron 39 but ferritin high at 2484. Imaging: Chest x-ray low lung volumes, CT chest abdomen abdomen pelvis w/o: Mild stranding/edema of pancreas concerning for pancreatitis, likely reactive/secondary duodenitis, hepatic steatosis, 2.6 cm left renal lesion. RVP panel pending Patient was aggressively volume resuscitated with 2 L bolus, was given vancomycin and Zosyn, placed on IV insulin drip, cefepime Flagyl and admitted for further management Critical care Dr Tacy Learn was consulted but advised stable for [facility] admission   Subjective: Seen and examined Overnight this morning temperature 97.6 BP stable in 140s with mild tachycardia, saturating well Blood sugar improving in 370s, labs pending Appears mildly confused but more oriented this morning.  Able to tell me his name current presented, current year and that his wife brought him to the hospital Current Accu-Chek sugar down to 277. Labs pending Complains of abdominal pain but no chest pain shortness of breath, he is moving all his extremities.  Assessment and Plan: Principal Problem:   DKA (diabetic ketoacidosis) ([place]) Active Problems:   AKI (acute kidney injury) ([place])   Pancreatitis   Hypercalcemia   Elevated LFTs   SIRS (systemic  inflammatory response syndrome) ([facility])   Acute metabolic encephalopathy  New onset diabetes DKA History of prediabetes: Blood sugar more than 1200 with anion gap 33  in ED. S/P fluid resuscitation, now on insulin drip> blood sugar down in high 200, anion gap closed earlier this morning.  But patient had pancreatitis, also with mild confusion remains n.p.o> continue insulin drip with serial monitoring of CBC, BMP, ivf W/ D5 once sugar <250. Recent Labs  Lab 01/13/23 0523 01/13/23 0603 01/13/23 0641 01/13/23 0743 01/13/23 0843  GLUCAP 412* 411* 376* 378* 314*    Pancreatitis lipase 166>1012: CT chest abdomen pelvis showed pancreatitis, does have history of alcohol abuse. TG level normal.  Continue fluid resuscitation, pain control, [facility] consulted.  Ferritin high in 2400 possibly EtOH related.  History of alcohol use 3-5 drinks per week:At risk of withdrawal add CIWA scale Ativan, thiamine folate/multivitamin, check ethanol level, urine drug screen.  drinks regularly- ~2 drinks at least every other day last drink about a week ago as per family  Acute metabolic encephalopathy in the setting of patient's DKA/SIRS possible alcohol withdrawal: Continue fall precautions supportive care delirium precaution, add CIWA scale Ativan  AKI Severe dehydration with DKA: creatinine nicely downtrending with IV fluid hydration, follow-up with renal ultrasound monitor output avoid nephrotoxic medications renally dose medication. Renal US " No hydronephrosis. Borderline mild renal enlargement which could go along with acute nephritis" Recent Labs    01/12/23 1423 01/12/23 1600 01/12/23 1932 01/13/23 0028 01/13/23 0253  BUN 90* 88* 76* 66* 64*  CREATININE 3.61* 3.37* 2.61* 2.27* 2.04*    Hypernatremia: remains high monitor while being resuscitated with IV fluids> change to hypotonic ivf, repeat bmp serially> sodium in 163 164, ask nephrology to evaluate.  Recent  Labs  Lab 01/12/23 1600  01/12/23 1701 01/12/23 1932 01/13/23 0028 01/13/23 0253  NA 135 137 143 151* 155*    Hypophosphatemia 1.9> 1.1: Replete w/ kphos 45 mm- pharmacy consulted Hypercalcemia-in the setting of severe dehydration: Follow calcium level, continue fluid resuscitation.  Check intact PTH Hyperkalemia resolved monitor Recent Labs  Lab 01/12/23 1423 01/12/23 1434 01/12/23 1600 01/12/23 1701 01/12/23 1932 01/13/23 0028 01/13/23 0253  K 5.4*   < > 6.0* 4.4 4.0 4.0 3.7  CALCIUM 12.2*  --  11.3*  --  11.6* 12.4* 12.4*  MG  --   --   --   --   --  3.7* 3.5*  PHOS  --   --   --   --   --  3.0 1.9*   < > = values in this interval not displayed.    Transaminitis: With history of alcohol use, hepatic steatosis on imaging.  Monitor labs GI consulted acute viral hepatitis panel negative Recent Labs  Lab 01/12/23 1423 01/12/23 1600 01/13/23 0003 01/13/23 0031 01/13/23 0253 01/13/23 0959  AST 66*  --   --   --   --   --   ALT 96*  --   --   --   --   --   ALKPHOS 76  --   --   --   --   --   BILITOT 1.8*  --   --   --   --   --   PROT 9.7*  --   --   --   --   --   ALBUMIN 4.6  --   --   --   --   --   AMMONIA  --   --  33  --   --   --   INR 1.3*  --   --   --   --   --   LIPASE  --  1,194*  --  166* 1,012*  --   PLT 325  --   --   --   --  238      Latest Ref Rng & Units 01/13/2023    2:53 AM  Hepatitis  Hep B Surface Ag NON REACTIVE NON REACTIVE   Hep B IgM NON REACTIVE PENDING   Hep C Ab NON REACTIVE PENDING   Hep A IgM NON REACTIVE PENDING    Mild rhabdomyolysis: cont ivf, monitor Recent Labs  Lab 01/13/23 0031  CKTOTAL 439*     Elevated troponin: Patient without chest pain likely demand ischemia in the setting of severe electrolyte imbalance.  Trend troponin  SIRS Leucocytosis Lactic acidosis: + procal 3.7>Chest x-ray CT chest on pelvis no obvious infection noted UA WBC 0-5.  Suspect this is due to patient's dehydration. R/o infection-continue current empiric antibiotics  pending results and Recent Labs  Lab 01/12/23 1423 01/12/23 1602 01/12/23 1932 01/13/23 0003 01/13/23 0031 01/13/23 0959  WBC 18.7*  --   --   --   --  19.8*  LATICACIDVEN 5.9* 6.8* 2.5* 2.3*  --   --   PROCALCITON  --   --   --   --  3.79  --     DVT prophylaxis: SCDs Start: 01/12/23 2346 Code Status:   Code Status: Full Code Family Communication: plan of care discussed with patient/no family at bedside this  am. Came back later this afternoon patient seen again, family wife and daughter at the bedside were updated. Patient status is: Inpatient  because of DKA Level of care: Stepdown   Dispo: The patient is from: home            Anticipated disposition: TBD Objective: Vitals last 24 hrs: Vitals:   01/13/23 0500 01/13/23 0600 01/13/23 0613 01/13/23 0800  BP: (!) 148/93 (!) 143/105  (!) 135/102  Pulse: (!) 110 (!) 39  (!) 110  Resp: (!) 24 20  (!) 22  Temp:   97.6 F (36.4 C)   TempSrc:   Oral   SpO2: 96% 96%  94%  Weight:      Height:       Weight change:   Physical Examination: General exam: Mildly confused/lethargic oriented to self place people  HEENT:Oral mucosa moist, Ear/Nose WNL grossly Respiratory system: bilaterally CLEAR BS, no use of accessory muscle Cardiovascular system: S1 & S2 +, No JVD. Gastrointestinal system: Abdomen soft,NT,ND, BS+ Nervous System:Alert, awake, moving extremities. Extremities: LE edema neg,distal peripheral pulses palpable.  Skin: No rashes,no icterus. MSK: Normal muscle bulk,tone, power  Medications reviewed:  Scheduled Meds:  Chlorhexidine Gluconate Cloth  6 each Topical Daily   vancomycin variable dose per unstable renal function (pharmacist dosing)   Does not apply See admin instructions   Continuous Infusions:  sodium chloride 150 mL/hr at 01/13/23 0752   ceFEPime (MAXIPIME) IV Stopped (01/13/23 0251)   dextrose 5% lactated ringers     insulin 9 mL/hr at 01/13/23 0746   lactated ringers Stopped (01/13/23 0102)    metronidazole Stopped (01/13/23 0215)   sodium phosphate 15 mmol in dextrose 5 % 250 mL infusion      Diet Order             Diet NPO time specified  Diet effective now                   Intake/Output Summary (Last 24 hours) at 01/13/2023 0959 Last data filed at 01/13/2023 0746 Gross per 24 hour  Intake 2227.44 ml  Output 2650 ml  Net -422.56 ml   Net IO Since Admission: -422.56 mL [01/13/23 0959]  Wt Readings from Last 3 Encounters:  01/12/23 81.5 kg  07/31/20 88.5 kg  09/27/19 88.5 kg     Unresulted Labs (From admission, onward)     Start     Ordered   01/14/23 0500  Magnesium  Tomorrow morning,   R       Question:  Specimen collection method  Answer:  Lab=Lab collect   01/13/23 0804   01/14/23 0500  Phosphorus  Tomorrow morning,   R       Question:  Specimen collection method  Answer:  Lab=Lab collect   01/13/23 0804   01/14/23 0500  Triglycerides  Tomorrow morning,   R       Question:  Specimen collection method  Answer:  Lab=Lab collect   01/13/23 0809   01/13/23 1000  Basic metabolic panel  (Diabetes Ketoacidosis (DKA))  STAT Now then every 4 hours ,   R      01/12/23 2349   01/13/23 0810  Triglycerides  Add-on,   AD       Question:  Specimen collection method  Answer:  Lab=Lab collect   01/13/23 0809   01/13/23 0500  C-peptide  Tomorrow morning,   R        01/12/23 2338   01/13/23 0500  CBC with Differential  Tomorrow morning,   R        01/12/23 2343   01/13/23 0500  Beta-hydroxybutyric  acid  (Diabetes Ketoacidosis (DKA))  Now then every 8 hours,   R      01/12/23 2349   01/13/23 0500  Hepatitis panel, acute  Tomorrow morning,   R        01/13/23 0011   01/13/23 0430  CBC with Differential/Platelet  Once,   R        01/13/23 0430   01/13/23 0218  Osmolality, urine  Once,   R        01/13/23 0218   01/13/23 0028  Hemoglobin A1c  Once,   R        01/13/23 0028   01/13/23 0008  Osmolality  Add-on,   AD        01/13/23 0007   01/12/23 2352  Respiratory  (~20 pathogens) panel by PCR  (Respiratory panel by PCR (~20 pathogens, ~24 hr TAT)  w precautions)  Once,   R        01/12/23 2351   01/12/23 2329  Beta-hydroxybutyric acid  ONCE - STAT,   STAT        01/12/23 2329   01/12/23 1429  Urine Culture  (Undifferentiated presentation (screening labs and basic nursing orders))  ONCE - URGENT,   URGENT       Question:  Indication  Answer:  Sepsis   01/12/23 1428          Data Reviewed: I have personally reviewed following labs and imaging studies CBC: Recent Labs  Lab 01/12/23 1423 01/12/23 1434 01/12/23 1701  WBC 18.7*  --   --   NEUTROABS 16.8*  --   --   HGB 17.0 18.0* 16.0  HCT 53.4* 53.0* 47.0  MCV 99.1  --   --   PLT 325  --   --    Basic Metabolic Panel: Recent Labs  Lab 01/12/23 1423 01/12/23 1434 01/12/23 1600 01/12/23 1701 01/12/23 1932 01/13/23 0028 01/13/23 0253  NA 133*   < > 135 137 143 151* 155*  K 5.4*   < > 6.0* 4.4 4.0 4.0 3.7  CL 87*  --  91*  --  104 110 118*  CO2 13*  --  14*  --  21* 25 26  GLUCOSE >1,200*  --  >1,200*  --  955* 726* 567*  BUN 90*  --  88*  --  76* 66* 64*  CREATININE 3.61*  --  3.37*  --  2.61* 2.27* 2.04*  CALCIUM 12.2*  --  11.3*  --  11.6* 12.4* 12.4*  MG  --   --   --   --   --  3.7* 3.5*  PHOS  --   --   --   --   --  3.0 1.9*   < > = values in this interval not displayed.   GFR: Estimated Creatinine Clearance: 40.6 mL/min (A) (by C-G formula based on SCr of 2.04 mg/dL (H)). Liver Function Tests: Recent Labs  Lab 01/12/23 1423  AST 66*  ALT 96*  ALKPHOS 76  BILITOT 1.8*  PROT 9.7*  ALBUMIN 4.6   Recent Labs  Lab 01/12/23 1600 01/13/23 0031 01/13/23 0253  LIPASE 1,194* 166* 1,012*   Recent Labs  Lab 01/13/23 0003  AMMONIA 33   Coagulation Profile: Recent Labs  Lab 01/12/23 1423  INR 1.3*   Recent Labs    01/13/23 0003  TSH 0.357   Sepsis Labs: Recent Labs  Lab 01/12/23 1423 01/12/23 1602 01/12/23 1932 01/13/23 0003 01/13/23 0031  PROCALCITON  --   --   --   --  3.79  LATICACIDVEN 5.9* 6.8* 2.5* 2.3*  --     Recent Results (from the past 240 hour(s))  Blood Culture (routine x 2)     Status: None (Preliminary result)   Collection Time: 01/12/23  2:23 PM   Specimen: Left Antecubital; Blood  Result Value Ref Range Status   Specimen Description   Final    LEFT ANTECUBITAL BLOOD Performed at [facility] [place] [facility], [address] [place], [place] [address]    Special Requests   Final    Blood Culture adequate volume BOTTLES DRAWN AEROBIC AND ANAEROBIC Performed at [facility], [address] [place], [place] [address]    Culture   Final    NO GROWTH < 12 HOURS Performed at [facility], [address] [place], [place] [address]    Report Status PENDING  Incomplete  Resp panel by RT-PCR (RSV, Flu A&B, Covid) Anterior Nasal Swab     Status: None   Collection Time: 01/12/23  2:30 PM   Specimen: Anterior Nasal Swab  Result Value Ref Range Status   SARS Coronavirus 2 by RT PCR NEGATIVE NEGATIVE Final    Comment: (NOTE) SARS-CoV-2 target nucleic acids are NOT DETECTED.  The SARS-CoV-2 RNA is generally detectable in upper respiratory specimens during the acute phase of infection. The lowest concentration of SARS-CoV-2 viral copies this assay can detect is 138 copies/mL. A negative result does not preclude SARS-Cov-2 infection and should not be used as the sole basis for treatment or other patient management decisions. A negative result may occur with  improper specimen collection/handling, submission of specimen other than nasopharyngeal swab, presence of viral mutation(s) within the areas targeted by this assay, and inadequate number of viral copies(<138 copies/mL). A negative result must be combined with clinical observations, patient history, and epidemiological information. The expected result is Negative.  Fact Sheet for Patients:  BloggerCourse.comhttps://www.fda.gov/media/152166/download  Fact  Sheet for Healthcare Providers:  SeriousBroker.ithttps://www.fda.gov/media/152162/download  This test is no t yet approved or cleared by the [place] FDA and  has been authorized for detection and/or diagnosis of SARS-CoV-2 by FDA under an Emergency Use Authorization (EUA). This EUA will remain  in effect (meaning this test can be used) for the duration of the COVID-19 declaration under Section 564(b)(1) of the Act, 21 U.S.C.section 360bbb-3(b)(1), unless the authorization is terminated  or revoked sooner.       Influenza A by PCR NEGATIVE NEGATIVE Final   Influenza B by PCR NEGATIVE NEGATIVE Final    Comment: (NOTE) The Xpert Xpress SARS-CoV-2/FLU/RSV plus assay is intended as an aid in the diagnosis of influenza from Nasopharyngeal swab specimens and should not be used as a sole basis for treatment. Nasal washings and aspirates are unacceptable for Xpert Xpress SARS-CoV-2/FLU/RSV testing.  Fact Sheet for Patients: BloggerCourse.comhttps://www.fda.gov/media/152166/download  Fact Sheet for Healthcare Providers: SeriousBroker.ithttps://www.fda.gov/media/152162/download  This test is not yet approved or cleared by the [place] FDA and has been authorized for detection and/or diagnosis of SARS-CoV-2 by FDA under an Emergency Use Authorization (EUA). This EUA will remain in effect (meaning this test can be used) for the duration of the COVID-19 declaration under Section 564(b)(1) of the Act, 21 U.S.C. section 360bbb-3(b)(1), unless the authorization is terminated or revoked.     Resp Syncytial Virus by PCR NEGATIVE NEGATIVE Final    Comment: (NOTE) Fact Sheet for Patients: BloggerCourse.comhttps://www.fda.gov/media/152166/download  Fact Sheet for Healthcare Providers:  SeriousBroker.it  This test is not yet approved or cleared by the [place] and has been authorized for detection and/or diagnosis of SARS-CoV-2 by FDA under an Emergency Use Authorization (EUA). This EUA will remain in effect  (meaning this test can be used) for the duration of the COVID-19 declaration under Section 564(b)(1) of the Act, 21 U.S.C. section 360bbb-3(b)(1), unless the authorization is terminated or revoked.  Performed at [facility], [address] [place], [place] [address]   Blood Culture (routine x 2)     Status: None (Preliminary result)   Collection Time: 01/12/23  2:37 PM   Specimen: BLOOD RIGHT HAND  Result Value Ref Range Status   Specimen Description   Final    BLOOD RIGHT HAND BLOOD Performed at [facility], [address] [place], [place] [address]    Special Requests   Final    Blood Culture adequate volume BOTTLES DRAWN AEROBIC AND ANAEROBIC Performed at [facility], [address] [place], [place] [address]    Culture   Final    NO GROWTH < 12 HOURS Performed at [facility], [address] [place], [place] [address]    Report Status PENDING  Incomplete    Antimicrobials: Anti-infectives (From admission, onward)    Start     Dose/Rate Route Frequency Ordered Stop   01/13/23 0100  ceFEPIme (MAXIPIME) 2 g in sodium chloride 0.9 % 100 mL IVPB        2 g 200 mL/hr over 30 Minutes Intravenous Every 12 hours 01/13/23 0002     01/13/23 0030  metroNIDAZOLE (FLAGYL) IVPB 500 mg        500 mg 100 mL/hr over 60 Minutes Intravenous Every 12 hours 01/12/23 2343 01/20/23 0029   01/12/23 1740  vancomycin variable dose per unstable renal function (pharmacist dosing)         Does not apply See admin instructions 01/12/23 1740     01/12/23 1630  vancomycin (VANCOCIN) 500 mg in sodium chloride 0.9 % 100 mL IVPB       See Hyperspace for full Linked Orders Report.   500 mg 100 mL/hr over 60 Minutes Intravenous  Once 01/12/23 1523 01/12/23 1818   01/12/23 1530  vancomycin (VANCOCIN) IVPB 1000 mg/200 mL premix       See Hyperspace for full Linked Orders Report.   1,000 mg 200 mL/hr over 60 Minutes Intravenous  Once 01/12/23 1523 01/12/23 1652    01/12/23 1500  piperacillin-tazobactam (ZOSYN) IVPB 3.375 g        3.375 g 100 mL/hr over 30 Minutes Intravenous  Once 01/12/23 1450 01/12/23 1530      Culture/Microbiology    Component Value Date/Time   SDES  01/12/2023 1437    BLOOD RIGHT HAND BLOOD Performed at [facility], [address], [place], [place] [address]    [facility]  01/12/2023 1437    Blood Culture adequate volume BOTTLES DRAWN AEROBIC AND ANAEROBIC Performed at [facility], [address], [place], [place] [address]    CULT  01/12/2023 1437    NO GROWTH < 12 HOURS Performed at [facility], [address], [place], [place] [address]    REPTSTATUS PENDING 01/12/2023 1437    Other culture-see note  Radiology Studies: US RENAL  Result Date: 01/13/2023 CLINICAL DATA:  Acute kidney injury EXAM: RENAL / URINARY TRACT ULTRASOUND COMPLETE COMPARISON:  CT 01/12/2023  FINDINGS: Right Kidney: Renal measurements: 10.9 x 5.5 x 6.3 cm = volume: 199 mL. Echogenicity within normal limits. No mass or hydronephrosis visualized. Left Kidney: Renal measurements: 12.3 x 6.4 x 7.3 cm = volume: 298 mL. Echogenicity within normal limits. No hydronephrosis. 3.4 x 2.8 x 2.6 cm cyst in the lower pole with mild septation. No follow-up recommended. Bladder: Appears normal for degree of bladder distention. Other: None. IMPRESSION: No hydronephrosis. Borderline mild renal enlargement which could go along with acute nephritis. Electronically Signed   By: Paulina FusiMark  Shogry M.D.   On: 01/13/2023 08:01   CT Head Wo Contrast  Result Date: 01/12/2023 CLINICAL DATA:  Mental status change, unknown cause EXAM: CT HEAD WITHOUT CONTRAST TECHNIQUE: Contiguous axial images were obtained from the base of the skull through the vertex without intravenous contrast. RADIATION DOSE REDUCTION: This exam was performed according to the departmental dose-optimization program which includes automated exposure control, adjustment of the mA  and/or kV according to patient size and/or use of iterative reconstruction technique. COMPARISON:  None Available. FINDINGS: Brain: No evidence of acute infarction, hemorrhage, hydrocephalus, extra-axial collection or mass lesion/mass effect. Mild patchy white matter hypodensities, nonspecific but compatible with chronic microvascular ischemic disease. Vascular: No hyperdense vessel identified. Skull: No acute fracture. Sinuses/Orbits: Mild paranasal sinus mucosal thickening. No acute orbital findings. Other: No mastoid effusions. IMPRESSION: No evidence of acute intracranial abnormality. Electronically Signed   By: Feliberto HartsFrederick S Jones M.D.   On: 01/12/2023 16:56   CT CHEST ABDOMEN PELVIS WO CONTRAST  Result Date: 01/12/2023 CLINICAL DATA:  Sepsis EXAM: CT CHEST, ABDOMEN AND PELVIS WITHOUT CONTRAST TECHNIQUE: Multidetector CT imaging of the chest, abdomen and pelvis was performed following the standard protocol without IV contrast. RADIATION DOSE REDUCTION: This exam was performed according to the departmental dose-optimization program which includes automated exposure control, adjustment of the mA and/or kV according to patient size and/or use of iterative reconstruction technique. COMPARISON:  None Available. FINDINGS: CT CHEST FINDINGS Cardiovascular: Calcific atherosclerosis of the coronary arteries. Normal heart size. No pericardial effusion. Mediastinum/Nodes: No enlarged mediastinal, hilar, or axillary lymph nodes. Thyroid gland, trachea, and esophagus demonstrate no significant findings. Lungs/Pleura: Lungs are clear. No pleural effusion or pneumothorax. Musculoskeletal: No chest wall mass or suspicious bone lesions identified. CT ABDOMEN PELVIS FINDINGS Hepatobiliary: Diffuse low-attenuation liver, compatible with hepatic steatosis. No visible mass lesion. Gallbladder is hyperdense but otherwise unremarkable. Pancreas: Mild stranding/edema around the pancreas, particularly the pancreatic head. Spleen:  Normal in size without focal abnormality. Adrenals/Urinary Tract: Adrenal glands are unremarkable. No hydronephrosis. Approximately 2.6 cm hypoattenuating left renal lesion with Hounsfield unit measurements that are indeterminate in areas. Stomach/Bowel: Mild thickening of the duodenum in the region the pancreas. Otherwise, unremarkable appearance of bowel loops. No dilated bowel loops to suggest obstruction. Normal appendix. Vascular/Lymphatic: No significant vascular findings are present. No enlarged abdominal or pelvic lymph nodes. Reproductive: Prostate is unremarkable. Other: Mild laxity anterior abdominal wall.  No significant ascites. Musculoskeletal: L5-S1 degenerative change. IMPRESSION: 1. Mild stranding/edema around the pancreas, concerning for pancreatitis. No discrete, drainable fluid collection. Correlate with lipase. 2. Mild thickening of the proximal duodenum in the region of the pancreas, likely secondary/reactive change. 3. Hepatic steatosis. 4. Indeterminate 2.6 cm left renal lesion. This may represent a cyst but recommend non-urgent follow-up renal ultrasound to better characterize. Electronically Signed   By: Feliberto HartsFrederick S Jones M.D.   On: 01/12/2023 16:55   DG Chest Port 1 View  Result Date: 01/12/2023 CLINICAL DATA:  Cough and upper respiratory infection. EXAM:  PORTABLE CHEST 1 VIEW COMPARISON:  07/31/2020 FINDINGS: 1417 hours. Low volume film. The lungs are clear without focal pneumonia, edema, pneumothorax or pleural effusion. The cardiopericardial silhouette is within normal limits for size. The visualized bony structures of the thorax are unremarkable. Telemetry leads overlie the chest. IMPRESSION: Low volume film without acute cardiopulmonary findings. Electronically Signed   By: Kennith Center M.D.   On: 01/12/2023 14:39     LOS: 1 day   Lanae Boast, MD [facility]  01/13/2023, 9:59 AM

## 2023-01-13 NOTE — Assessment & Plan Note (Signed)
History of elevated LFTs in the past Check hepatitis serologies Continue to follow hold statins check CK Check ferritin and iron levels

## 2023-01-13 NOTE — Inpatient Diabetes Management (Addendum)
Inpatient Diabetes Program Recommendations  AACE/ADA: New Consensus Statement on Inpatient Glycemic Control (2015)  Target Ranges:  Prepandial:   less than 140 mg/dL      Peak postprandial:   less than 180 mg/dL (1-2 hours)      Critically ill patients:  140 - 180 mg/dL    Latest Reference Range & Units 01/12/23 14:23 01/13/23 00:05  Beta-Hydroxybutyric Acid 0.05 - 0.27 mmol/L >8.00 (H) 3.28 (H)  (H): Data is abnormally high  Latest Reference Range & Units 01/12/23 14:23  Sodium 135 - 145 mmol/L 133 (L)  Potassium 3.5 - 5.1 mmol/L 5.4 (H)  Chloride 98 - 111 mmol/L 87 (L)  CO2 22 - 32 mmol/L 13 (L)  Glucose 70 - 99 mg/dL >1,200 (HH)  BUN 6 - 20 mg/dL 90 (H)  Creatinine 0.61 - 1.24 mg/dL 3.61 (H)  Calcium 8.9 - 10.3 mg/dL 12.2 (H)  Anion gap 5 - 15  33 (H)  (HH): Data is critically high (L): Data is abnormally low (H): Data is abnormally high  Latest Reference Range & Units 01/12/23 15:55 01/12/23 16:51 01/12/23 18:01 01/12/23 18:45 01/12/23 19:33 01/12/23 20:22 01/12/23 21:33 01/12/23 22:36  Glucose-Capillary 70 - 99 mg/dL >600 (HH)  IV Insulin Drip Started >600 (HH) >600 (HH) >600 (HH) >600 (HH) >600 (HH) >600 (HH) >600 (HH)  ([place]): Data is critically high  Latest Reference Range & Units 01/12/23 23:10 01/12/23 23:48 01/13/23 00:36 01/13/23 01:15 01/13/23 01:49 01/13/23 02:21 01/13/23 02:54 01/13/23 03:27  Glucose-Capillary 70 - 99 mg/dL >600 (HH) >600 (HH) >600 (HH) 556 (HH) 581 (HH) 546 (HH) 498 (H) 486 (H)  (HH): Data is critically high (H): Data is abnormally high  Latest Reference Range & Units 01/13/23 04:14 01/13/23 04:48 01/13/23 05:23 01/13/23 06:03 01/13/23 06:41  Glucose-Capillary 70 - 99 mg/dL 440 (H)  IV Insulin Drip Infusing 435 (H) 412 (H) 411 (H) 376 (H)  IV Insulin Drip Infusing  (H): Data is abnormally high   Admit with: DKA/ New Diagnosis Diabetes/ Acute Kidney Injury/ Early Pancreatitis  History: Pre-diabetes  Home DM Meds: None  Current Orders: IV  Insulin Drip    Current A1c Level Pending  PCP: Dr. Jeralene Huff with [place] [facility]--Will order educational materials for pt and will speak with pt when best appropriate   MD- Please leave pt on the IV Insulin Drip until CBGs stable less than 180, Anion Gap 12 or less, and CO2 level 20 or higher    Addendum 12:20pm--Met w/ pt's wife and daughter at bedside.  Conversation centered with wife and dtr as pt remains confused.  Wife told me pt was labeled "borderline diabetes" by his PCP Dr. Jeralene Huff.  Pt has family members with diabetes.  I had brief conversation with wife and dtr about new diagnosis of diabetes and how the recent URI and Pancreatitis may have driven pt's CBGs upward and revealed his diagnosis of diabetes (discussed with family that pt may have had undiagnosed diabetes prior to admission).  We reviewed that pt's A1c is pending and explained to wife and dtr that the A1c will tell us how high his CBGs have been the past 3 months and the A1c will also help the team determine a diabetes med regimen when pt goes home.  Discussed with patient's family diagnosis of DKA (pathophysiology), treatment of DKA, lab results, and transition plan to SQ insulin regimen (when pt ready).  Also reviewed labs indicating pancreatitis.  We talked about how we want to lower  the CBGs slowly and when all his labs look stable the MD will order SQ Insulin injections.  We also talked about the possibility that pt will need insulin when he goes home given his Glu was 1200 mg/dl on admit.  Wife and dtr very appreciative of visit and look forward to further education.  I have ordered LWWD book, RD consult (see pt when A&O), Insulin pen teaching kit.  I also placed several DM Edu videos (QR codes) on AVS and pt's wife and Dtr stated they knew how to scan the QR codes to get the videos started on their phones.      --Will follow patient during hospitalization--  Wyn Quaker RN, MSN,  [place] Diabetes Coordinator Inpatient Glycemic Control Team Team Pager: [phone] (8a-5p)

## 2023-01-13 NOTE — Progress Notes (Signed)
Paged Dr. Lupita Leash to inform of critical Na 165. Per Dr. Lupita Leash to stop NS@150  and Phos. drip (because CBG is 254, to change drip to D5 1/2 NS). This nurse stopped drip and switched fluids over. Pads added to patient's side rales, updated patients family at bedside.

## 2023-01-13 NOTE — Assessment & Plan Note (Signed)
In the setting of dehydration, hyperglycemia and DKA  Imroving with IV fluids CT head non acute Ammonia pending no evidence of hypercapnia

## 2023-01-13 NOTE — Assessment & Plan Note (Signed)
In the setting of severe dehydration  Obtain urine electrolytes  Given renal cyst will obtain rena [place]  Follow renal function expect improvement with rehydration no evidence of obstruction on CT

## 2023-01-14 DIAGNOSIS — E1110 Type 2 diabetes mellitus with ketoacidosis without coma: Secondary | ICD-10-CM | POA: Diagnosis not present

## 2023-01-14 LAB — BASIC METABOLIC PANEL
Anion gap: 9 (ref 5–15)
Anion gap: 11 (ref 5–15)
BUN: 38 mg/dL — ABNORMAL HIGH (ref 6–20)
BUN: 25 mg/dL — ABNORMAL HIGH (ref 6–20)
CO2: 20 mmol/L — ABNORMAL LOW (ref 22–32)
CO2: 21 mmol/L — ABNORMAL LOW (ref 22–32)
Calcium: 9.6 mg/dL (ref 8.9–10.3)
Calcium: 9.1 mg/dL (ref 8.9–10.3)
Chloride: 128 mmol/L — ABNORMAL HIGH (ref 98–111)
Chloride: 124 mmol/L — ABNORMAL HIGH (ref 98–111)
Creatinine, Ser: 1.46 mg/dL — ABNORMAL HIGH (ref 0.61–1.24)
Creatinine, Ser: 1.06 mg/dL (ref 0.61–1.24)
GFR, Estimated: 56 mL/min — ABNORMAL LOW (ref >60)
GFR, Estimated: >60 mL/min (ref >60)
Glucose, Bld: 302 mg/dL — ABNORMAL HIGH (ref 70–99)
Glucose, Bld: 144 mg/dL — ABNORMAL HIGH (ref 70–99)
Potassium: 2.9 mmol/L — ABNORMAL LOW (ref 3.5–5.1)
Potassium: 3.5 mmol/L (ref 3.5–5.1)
Sodium: 157 mmol/L — ABNORMAL HIGH (ref 135–145)
Sodium: 156 mmol/L — ABNORMAL HIGH (ref 135–145)

## 2023-01-14 LAB — CBC
HCT: 44 % (ref 39.0–52.0)
Hemoglobin: 14 g/dL (ref 13.0–17.0)
MCH: 31.5 pg (ref 26.0–34.0)
MCHC: 31.8 g/dL (ref 30.0–36.0)
MCV: 98.9 fL (ref 80.0–100.0)
Platelets: 159 10*3/uL (ref 150–400)
RBC: 4.45 MIL/uL (ref 4.22–5.81)
RDW: 12.5 % (ref 11.5–15.5)
WBC: 16.3 10*3/uL — ABNORMAL HIGH (ref 4.0–10.5)
nRBC: 0 % (ref 0.0–0.2)

## 2023-01-14 LAB — MAGNESIUM: Magnesium: 1.9 mg/dL (ref 1.7–2.4)

## 2023-01-14 LAB — GLUCOSE, CAPILLARY
Glucose-Capillary: 159 mg/dL — ABNORMAL HIGH (ref 70–99)
Glucose-Capillary: 153 mg/dL — ABNORMAL HIGH (ref 70–99)
Glucose-Capillary: 163 mg/dL — ABNORMAL HIGH (ref 70–99)
Glucose-Capillary: 220 mg/dL — ABNORMAL HIGH (ref 70–99)
Glucose-Capillary: 232 mg/dL — ABNORMAL HIGH (ref 70–99)
Glucose-Capillary: 268 mg/dL — ABNORMAL HIGH (ref 70–99)
Glucose-Capillary: 225 mg/dL — ABNORMAL HIGH (ref 70–99)
Glucose-Capillary: 214 mg/dL — ABNORMAL HIGH (ref 70–99)
Glucose-Capillary: 161 mg/dL — ABNORMAL HIGH (ref 70–99)
Glucose-Capillary: 145 mg/dL — ABNORMAL HIGH (ref 70–99)
Glucose-Capillary: 142 mg/dL — ABNORMAL HIGH (ref 70–99)
Glucose-Capillary: 129 mg/dL — ABNORMAL HIGH (ref 70–99)
Glucose-Capillary: 136 mg/dL — ABNORMAL HIGH (ref 70–99)
Glucose-Capillary: 128 mg/dL — ABNORMAL HIGH (ref 70–99)
Glucose-Capillary: 156 mg/dL — ABNORMAL HIGH (ref 70–99)
Glucose-Capillary: 137 mg/dL — ABNORMAL HIGH (ref 70–99)
Glucose-Capillary: 151 mg/dL — ABNORMAL HIGH (ref 70–99)
Glucose-Capillary: 129 mg/dL — ABNORMAL HIGH (ref 70–99)
Glucose-Capillary: 146 mg/dL — ABNORMAL HIGH (ref 70–99)
Glucose-Capillary: 111 mg/dL — ABNORMAL HIGH (ref 70–99)
Glucose-Capillary: 120 mg/dL — ABNORMAL HIGH (ref 70–99)
Glucose-Capillary: 111 mg/dL — ABNORMAL HIGH (ref 70–99)
Glucose-Capillary: 151 mg/dL — ABNORMAL HIGH (ref 70–99)
Glucose-Capillary: 137 mg/dL — ABNORMAL HIGH (ref 70–99)

## 2023-01-14 LAB — C-PEPTIDE: C-Peptide: <0.1 ng/mL — ABNORMAL LOW (ref 1.1–4.4)

## 2023-01-14 LAB — RAPID URINE DRUG SCREEN, HOSP PERFORMED
Amphetamines: NOT DETECTED
Barbiturates: NOT DETECTED
Benzodiazepines: NOT DETECTED
Cocaine: NOT DETECTED
Opiates: NOT DETECTED
Tetrahydrocannabinol: NOT DETECTED

## 2023-01-14 LAB — TRIGLYCERIDES: Triglycerides: 50 mg/dL (ref <150)

## 2023-01-14 LAB — HEPATIC FUNCTION PANEL
ALT: 52 U/L — ABNORMAL HIGH (ref 0–44)
AST: 89 U/L — ABNORMAL HIGH (ref 15–41)
Albumin: 2.9 g/dL — ABNORMAL LOW (ref 3.5–5.0)
Alkaline Phosphatase: 47 U/L (ref 38–126)
Bilirubin, Direct: 0.1 mg/dL (ref 0.0–0.2)
Indirect Bilirubin: 0.5 mg/dL (ref 0.3–0.9)
Total Bilirubin: 0.6 mg/dL (ref 0.3–1.2)
Total Protein: 6.3 g/dL — ABNORMAL LOW (ref 6.5–8.1)

## 2023-01-14 LAB — PTH, INTACT AND CALCIUM
Calcium, Total (PTH): 12.1 mg/dL — ABNORMAL HIGH (ref 8.7–10.2)
PTH: 7 pg/mL — ABNORMAL LOW (ref 15–65)

## 2023-01-14 LAB — CK: Total CK: 2005 U/L — ABNORMAL HIGH (ref 49–397)

## 2023-01-14 LAB — PHOSPHORUS
Phosphorus: 1.2 mg/dL — ABNORMAL LOW (ref 2.5–4.6)
Phosphorus: 2.9 mg/dL (ref 2.5–4.6)

## 2023-01-14 LAB — BETA-HYDROXYBUTYRIC ACID
Beta-Hydroxybutyric Acid: 1.47 mmol/L — ABNORMAL HIGH (ref 0.05–0.27)
Beta-Hydroxybutyric Acid: 0.11 mmol/L (ref 0.05–0.27)

## 2023-01-14 LAB — HEMOGLOBIN A1C
Hgb A1c MFr Bld: 13.7 % — ABNORMAL HIGH (ref 4.8–5.6)
Mean Plasma Glucose: 346 mg/dL

## 2023-01-14 LAB — TROPONIN I (HIGH SENSITIVITY): Troponin I (High Sensitivity): 36 ng/L — ABNORMAL HIGH (ref <18)

## 2023-01-14 MED ORDER — POTASSIUM CHLORIDE 10 MEQ/100ML IV SOLN
10.0000 meq | INTRAVENOUS | Status: AC
  Administered 2023-01-14 (×6): 10 meq via INTRAVENOUS
  Filled 2023-01-14 (×6): qty 100

## 2023-01-14 MED ORDER — POTASSIUM PHOSPHATES 15 MMOLE/5ML IV SOLN
30.0000 mmol | Freq: Once | INTRAVENOUS | Status: AC
  Administered 2023-01-14: 30 mmol via INTRAVENOUS
  Filled 2023-01-14: qty 10

## 2023-01-14 MED ORDER — DEXTROSE 5 % IV SOLN: INTRAVENOUS | Status: AC

## 2023-01-14 NOTE — Inpatient Diabetes Management (Signed)
Inpatient Diabetes Program Recommendations  AACE/ADA: New Consensus Statement on Inpatient Glycemic Control (2015)  Target Ranges:  Prepandial:   less than 140 mg/dL      Peak postprandial:   less than 180 mg/dL (1-2 hours)      Critically ill patients:  140 - 180 mg/dL   Lab Results  Component Value Date   GLUCAP 142 (H) 01/14/2023    Review of Glycemic Control  Latest Reference Range & Units 01/14/23 05:06 01/14/23 06:06 01/14/23 07:18 01/14/23 08:22  Glucose-Capillary 70 - 99 mg/dL 214 (H) 161 (H) 145 (H) 142 (H)  (H): Data is abnormally high Admit with: DKA/ New Diagnosis Diabetes/ Acute Kidney Injury/ Early Pancreatitis   History: Pre-diabetes Home DM Meds: None Current Orders: IV Insulin Drip   Current A1c Level Pending   PCP: Dr. Jeralene Huff with [place] [facility]--Will order educational materials for pt and will speak with pt when appropriate  Inpatient Diabetes Program Recommendations:    Continue with IV insulin at this time given insulin drip rates exceeding 20 units/hr.   Following.   Thanks, Bronson Curb, MSN, RNC-OB Diabetes Coordinator [phone] (8a-5p)

## 2023-01-14 NOTE — Plan of Care (Signed)
Nutrition Education Note  RD consulted for nutrition education regarding diabetes.   Lab Results  Component Value Date   HGBA1C 13.7 (H) 01/13/2023   Patient asleep at time of visit and noted to be lethargic and not alert today. Family (wife, daughter, nephew) present and able to receive died education.  RD provided "Carbohydrate Counting for People with Diabetes", "Plate Method for Diabetes" and "Diabetes Label Reading Tips" handouts from the Academy of Nutrition and Dietetics. Discussed different food groups and their effects on blood sugar, emphasizing carbohydrate-containing foods. Provided list of carbohydrates and recommended serving sizes of common foods.  Discussed importance of controlled and consistent carbohydrate intake throughout the day. Provided examples of ways to balance meals/snacks and encouraged intake of high-fiber, whole grain complex carbohydrates. Teach back method used. Wife reports they had been wanting to try to eat healthier before this all happened so they are now very motivated to make healthier changes.   Expect good compliance.  Body mass index is 29.27 kg/m. Pt meets criteria for overweight based on current BMI.  Current diet order is NPO. Labs and medications reviewed. Will follow for patient's alertness to improve. Otherwise, No further nutrition interventions warranted at this time. RD contact information provided. If additional nutrition issues arise, please re-consult RD.  Samson Frederic RD, LDN For contact information, refer to [facility].

## 2023-01-14 NOTE — Progress Notes (Signed)
Patient attempted to exit the bed this AM, refused to follow commands, could not answer questions appropriately. This nurse was concerned for the patient's safety as he continued to attempt to exit the bed and stated, "I need to find my phone and get up." Patient was given 2mg  of ativan per CIWA scoring.

## 2023-01-14 NOTE — TOC Initial Note (Signed)
Transition of Care [facility]) - Initial/Assessment Note    Patient Details  Name: Vincent Thompson MRN: [id number] Date of Birth: 05/12/1966  [facility] CM/SW Contact:    Vincent Kaufman, RN Phone Number: 01/14/2023, 5:54 PM  Clinical Narrative:    Vincent Thompson consult for SA resources. This RNCM spoke with patient's wife Vincent Thompson, as patient has AMS. This RNCM offered SA resources, however patient's wife Vincent Thompson declined. This RNCM explain her role, patient's wife verbalized understanding and has no questions at this time.  TOC will follow for discharge needs.                Expected Discharge Plan: Home/Self Care Barriers to Discharge: Continued Medical Work up   Patient Goals and CMS Choice Patient states their goals for this hospitalization and ongoing recovery are:: return home feeling better CMS Medicare.gov Compare Post Acute Care list provided to:: Patient Represenative (must comment) Vincent Thompson (spouse)) Choice offered to / list presented to : [place] ownership interest in [facility]provided to:: Spouse    Expected Discharge Plan and Services In-house Referral: NA Discharge Planning Services: CM Consult Post Acute Care Choice: NA Living arrangements for the past 2 months: Single Family Home                 DME Arranged: N/A DME Agency: NA                  Prior Living Arrangements/Services Living arrangements for the past 2 months: Single Family Home Lives with:: Spouse Patient language and need for interpreter reviewed:: Yes Do you feel safe going back to the place where you live?: Yes      Need for Family Participation in Patient Care: No (Comment) Care giver support system in place?: Yes (comment) Current home services: Other (comment) (none) Criminal Activity/Legal Involvement Pertinent to Current Situation/Hospitalization: No - Comment as needed  Activities of Daily Living      Permission Sought/Granted Permission  sought to share information with : Case Manager Permission granted to share information with : Yes, Verbal Permission Granted  Share Information with NAME: Case manager           Emotional Assessment Appearance:: Appears stated age Attitude/Demeanor/Rapport: Gracious Affect (typically observed): Accepting Orientation: : Oriented to Self, Oriented to Place Alcohol / Substance Use: Alcohol Use (drinks on occasion (3-5 drinks per week)) Psych Involvement: No (comment)  Admission diagnosis:  Confusion [R41.0] DKA (diabetic ketoacidosis) ([place]) [E11.10] AKI (acute kidney injury) ([place]) [N17.9] Diabetic ketoacidosis without coma associated with type 2 diabetes mellitus ([place]) [E11.10] Patient Active Problem List   Diagnosis Date Noted   AKI (acute kidney injury) ([place]) 01/13/2023   Pancreatitis 01/13/2023   Hypercalcemia 01/13/2023   Elevated LFTs 01/13/2023   SIRS (systemic inflammatory response syndrome) ([place]) 35/57/3220   Acute metabolic encephalopathy 25/42/7062   DKA (diabetic ketoacidosis) ([place]) 01/12/2023   PCP:  Vincent Petty, MD Pharmacy:   [facility], [place] [address] Phone: [phone] Fax: [facility] [address], [place] [address] Phone: [phone] Fax: [phone]     Social Determinants of Health (SDOH) Social History: SDOH Screenings   Tobacco Use: Low Risk  (01/13/2023)   SDOH Interventions:     Readmission Risk Interventions     No data to display

## 2023-01-14 NOTE — Progress Notes (Signed)
[facility] Progress Note  SUBJECTIVE:   Interval history: Vincent Thompson was seen and evaluated today at bedside.  Vincent Thompson was seen laying comfortably in bed, Vincent Thompson did appear somewhat restless.  Awake and alert to name and location though not to time.  No family at bedside.  Vincent Thompson denied any abdominal pain.  Denied chest pain or shortness of breath.  Received Ativan 2 mg IV this a.m.  Receiving potassium replacement.  Anion gap closed at 9.  Elevated CK at 2005.  Serum sodium elevated at 157.  Past Medical History:  Diagnosis Date   Hyperlipemia    Hypertension    History reviewed. No pertinent surgical history. Current Facility-Administered Medications  Medication Dose Route Frequency Provider Last Rate Last Admin   0.9 %  sodium chloride infusion   Intravenous Continuous Roney Jaffe, MD 125 mL/hr at 01/14/23 1328 New Bag at 01/14/23 1328   ceFEPIme (MAXIPIME) 2 g in sodium chloride 0.9 % 100 mL IVPB  2 g Intravenous Q12H Poindexter, Leann T, RPH   Stopped at 01/14/23 0132   Chlorhexidine Gluconate Cloth 2 % PADS 6 each  6 each Topical Daily Toy Baker, MD   6 each at 01/14/23 1024   dextrose 5 % solution   Intravenous Continuous Roney Jaffe, MD 165 mL/hr at 01/14/23 1228 New Bag at 01/14/23 1228   dextrose 50 % solution 0-50 mL  0-50 mL Intravenous PRN Toy Baker, MD       folic acid (FOLVITE) tablet 1 mg  1 mg Oral Daily Kc, Ramesh, MD   1 mg at 01/13/23 1058   insulin regular, human (MYXREDLIN) 100 units/ 100 mL infusion   Intravenous Continuous Doutova, Anastassia, MD 10 mL/hr at 01/14/23 0653 10 Units/hr at 01/14/23 0653   LORazepam (ATIVAN) tablet 1-4 mg  1-4 mg Oral Q1H PRN Antonieta Pert, MD       Or   LORazepam (ATIVAN) injection 1-4 mg  1-4 mg Intravenous Q1H PRN Kc, Ramesh, MD   2 mg at 01/14/23 7654   metroNIDAZOLE (FLAGYL) IVPB 500 mg  500 mg Intravenous Q12H Toy Baker, MD   Stopped at 01/14/23 0056   multivitamin with minerals tablet 1  tablet  1 tablet Oral Daily Kc, Ramesh, MD       Oral care mouth rinse  15 mL Mouth Rinse PRN Doutova, Anastassia, MD       thiamine (VITAMIN B1) tablet 100 mg  100 mg Oral Daily Kc, Ramesh, MD       Or   thiamine (VITAMIN B1) injection 100 mg  100 mg Intravenous Daily Kc, Ramesh, MD   100 mg at 01/14/23 1025   vancomycin (VANCOREADY) IVPB 1500 mg/300 mL  1,500 mg Intravenous Q24H Tawnya Crook, RPH   Stopped at 01/13/23 2054   Allergies as of 01/12/2023   (No Known Allergies)   Review of Systems:  Review of Systems  Respiratory:  Negative for shortness of breath.   Cardiovascular:  Negative for chest pain.  Gastrointestinal:  Negative for abdominal pain.    OBJECTIVE:   Temp:  [98.2 F (36.8 C)-100 F (37.8 C)] 98.9 F (37.2 C) (01/23 1152) Pulse Rate:  [87-110] 87 (01/23 0900) Resp:  [16-31] 29 (01/23 0900) BP: (134-169)/(89-111) 150/98 (01/23 0900) SpO2:  [94 %-99 %] 96 % (01/23 0900) Last BM Date :  (PTA) Physical Exam Constitutional:      General: Vincent Thompson is not in acute distress.    Appearance: Vincent Thompson is not ill-appearing, toxic-appearing or  diaphoretic.  Cardiovascular:     Rate and Rhythm: Regular rhythm. Tachycardia present.  Pulmonary:     Effort: No respiratory distress.     Breath sounds: Normal breath sounds.     Comments: No supplemental oxygen in place at time of exam. Abdominal:     General: Bowel sounds are normal. There is no distension.     Palpations: Abdomen is soft.     Tenderness: There is no abdominal tenderness. There is no guarding.  Neurological:     Mental Status: Vincent Thompson is alert.     Labs: Recent Labs    01/12/23 1423 01/12/23 1434 01/12/23 1701 01/13/23 0959 01/14/23 0259  WBC 18.7*  --   --  19.8* 16.3*  HGB 17.0   < > 16.0 16.2 14.0  HCT 53.4*   < > 47.0 48.6 44.0  PLT 325  --   --  238 159   < > = values in this interval not displayed.   BMET Recent Labs    01/13/23 1354 01/13/23 2029 01/14/23 0259  NA 164* 159* 157*  K 3.3*  5.3* 2.9*  CL 128* 126* 128*  CO2 24 24 20*  GLUCOSE 241* 237* 302*  BUN 51* 43* 38*  CREATININE 1.46* 1.43* 1.46*  CALCIUM 11.6*  12.1* 10.2 9.6   LFT Recent Labs    01/14/23 0259  PROT 6.3*  ALBUMIN 2.9*  AST 89*  ALT 52*  ALKPHOS 47  BILITOT 0.6  BILIDIR 0.1  IBILI 0.5   PT/INR Recent Labs    01/12/23 1423  LABPROT 15.9*  INR 1.3*   Diagnostic imaging: US RENAL  Result Date: 01/13/2023 CLINICAL DATA:  Acute kidney injury EXAM: RENAL / URINARY TRACT ULTRASOUND COMPLETE COMPARISON:  CT 01/12/2023 FINDINGS: Right Kidney: Renal measurements: 10.9 x 5.5 x 6.3 cm = volume: 199 mL. Echogenicity within normal limits. No mass or hydronephrosis visualized. Left Kidney: Renal measurements: 12.3 x 6.4 x 7.3 cm = volume: 298 mL. Echogenicity within normal limits. No hydronephrosis. 3.4 x 2.8 x 2.6 cm cyst in the lower pole with mild septation. No follow-up recommended. Bladder: Appears normal for degree of bladder distention. Other: None. IMPRESSION: No hydronephrosis. Borderline mild renal enlargement which could go along with acute nephritis. Electronically Signed   By: Nelson Chimes M.D.   On: 01/13/2023 08:01   CT Head Wo Contrast  Result Date: 01/12/2023 CLINICAL DATA:  Mental status change, unknown cause EXAM: CT HEAD WITHOUT CONTRAST TECHNIQUE: Contiguous axial images were obtained from the base of the skull through the vertex without intravenous contrast. RADIATION DOSE REDUCTION: This exam was performed according to the departmental dose-optimization program which includes automated exposure control, adjustment of the mA and/or kV according to patient size and/or use of iterative reconstruction technique. COMPARISON:  None Available. FINDINGS: Brain: No evidence of acute infarction, hemorrhage, hydrocephalus, extra-axial collection or mass lesion/mass effect. Mild patchy white matter hypodensities, nonspecific but compatible with chronic microvascular ischemic disease. Vascular: No  hyperdense vessel identified. Skull: No acute fracture. Sinuses/Orbits: Mild paranasal sinus mucosal thickening. No acute orbital findings. Other: No mastoid effusions. IMPRESSION: No evidence of acute intracranial abnormality. Electronically Signed   By: Margaretha Sheffield M.D.   On: 01/12/2023 16:56   CT CHEST ABDOMEN PELVIS WO CONTRAST  Result Date: 01/12/2023 CLINICAL DATA:  Sepsis EXAM: CT CHEST, ABDOMEN AND PELVIS WITHOUT CONTRAST TECHNIQUE: Multidetector CT imaging of the chest, abdomen and pelvis was performed following the standard protocol without IV contrast. RADIATION DOSE REDUCTION: This exam  was performed according to the departmental dose-optimization program which includes automated exposure control, adjustment of the mA and/or kV according to patient size and/or use of iterative reconstruction technique. COMPARISON:  None Available. FINDINGS: CT CHEST FINDINGS Cardiovascular: Calcific atherosclerosis of the coronary arteries. Normal heart size. No pericardial effusion. Mediastinum/Nodes: No enlarged mediastinal, hilar, or axillary lymph nodes. Thyroid gland, trachea, and esophagus demonstrate no significant findings. Lungs/Pleura: Lungs are clear. No pleural effusion or pneumothorax. Musculoskeletal: No chest wall mass or suspicious bone lesions identified. CT ABDOMEN PELVIS FINDINGS Hepatobiliary: Diffuse low-attenuation liver, compatible with hepatic steatosis. No visible mass lesion. Gallbladder is hyperdense but otherwise unremarkable. Pancreas: Mild stranding/edema around the pancreas, particularly the pancreatic head. Spleen: Normal in size without focal abnormality. Adrenals/Urinary Tract: Adrenal glands are unremarkable. No hydronephrosis. Approximately 2.6 cm hypoattenuating left renal lesion with Hounsfield unit measurements that are indeterminate in areas. Stomach/Bowel: Mild thickening of the duodenum in the region the pancreas. Otherwise, unremarkable appearance of bowel loops. No  dilated bowel loops to suggest obstruction. Normal appendix. Vascular/Lymphatic: No significant vascular findings are present. No enlarged abdominal or pelvic lymph nodes. Reproductive: Prostate is unremarkable. Other: Mild laxity anterior abdominal wall.  No significant ascites. Musculoskeletal: L5-S1 degenerative change. IMPRESSION: 1. Mild stranding/edema around the pancreas, concerning for pancreatitis. No discrete, drainable fluid collection. Correlate with lipase. 2. Mild thickening of the proximal duodenum in the region of the pancreas, likely secondary/reactive change. 3. Hepatic steatosis. 4. Indeterminate 2.6 cm left renal lesion. This may represent a cyst but recommend non-urgent follow-up renal ultrasound to better characterize. Electronically Signed   By: Feliberto Harts M.D.   On: 01/12/2023 16:55   DG Chest Port 1 View  Result Date: 01/12/2023 CLINICAL DATA:  Cough and upper respiratory infection. EXAM: PORTABLE CHEST 1 VIEW COMPARISON:  07/31/2020 FINDINGS: 1417 hours. Low volume film. The lungs are clear without focal pneumonia, edema, pneumothorax or pleural effusion. The cardiopericardial silhouette is within normal limits for size. The visualized bony structures of the thorax are unremarkable. Telemetry leads overlie the chest. IMPRESSION: Low volume film without acute cardiopulmonary findings. Electronically Signed   By: Kennith Center M.D.   On: 01/12/2023 14:39    IMPRESSION: Acute interstitial edematous pancreatitis Alcohol use prior to admission, presumed etiology of above Diabetic ketoacidosis Acute kidney injury, nephrology following Transaminase elevation in mixed pattern, stable Hypernatremia Hypokalemia Lactic acidosis Hyperlipidemia, normal triglyceride level  PLAN: -Suspect alcohol may be a etiology of acute pancreatitis -Continue IV hydration and pain control -Okay to advance to low-fat diet when appropriate per DKA treatment protocol -Continue CIWA  protocol -Trend liver enzyme panel -[facility] will follow   LOS: 2 days   Liliane Shi, [facility]

## 2023-01-14 NOTE — Progress Notes (Signed)
[facility] Progress Note  Subjective: pt seen in ICU, good UOP 2200 cc yesterday. Net I/O is now 3.8 L.   Vitals:   01/14/23 0700 01/14/23 0720 01/14/23 0730 01/14/23 0800  BP: (!) 169/97 (!) 141/97  (!) 166/94  Pulse: 96 98 97 94  Resp: 18 (!) 31 (!) 30 (!) 27  Temp:    100 F (37.8 C)  TempSrc:    Axillary  SpO2: 99% 97% 97% 99%  Weight:      Height:        Exam: Gen lethargic, hands in mittens, on RA BP's stable 160/90 range Sclera anicteric, throat clear  No jvd or bruits, flat neck veins Chest clear bilat to bases RRR no MRG Abd soft ntnd no mass or ascites +bs Ext no LE or UE edema Neuro as above          Home meds include - amlodipine-benazepril, lexapro, simvastatin, vits/ supps/ prns      Na 134 --- >> 155 --> 164 today as BS was corrected    BS 1200 --> 955 --> 567 --> 241 today    BHB 8.00 --> 3.28 --> 0.95    BUN 90 -> 64 --> 51    Creat 3.62 --> 2.04 --> 1.46    Ca 12.2 --> 12.4 --> 11.6    Phos 1.9 --> 1.1     UA >500 bs, neg protein, 0-5 wbc/ rbc, many bact    I/O 2.8 L in and 3.6 L out, net neg 862 cc      US renal - 10.9/ 12.3 cm kidneys w/o hydro   Assessment/ Plan: Hypernatremia - Na+ 164 in setting of severe new onset DKA. On exam pt is still quite vol depleted overall so this is c/w hypovolemic hyponatremia. Rx is hypotonic fluids (D5W) started yesterday at 165 cc/ hr. Na+ down to 156 this am. Continue D5W.  Hypovolemia - sig vol depletion per initial exam, gave 2L bolus yest and will continue 0.9% saline at 125 cc/hr.  AKI - creat 3.6 on admission, down to 1.4 yest and 1.4 today. Likely cause was vol depletion due to unknown new DKA w/ osmotic diuresis.  Hypophosphatemia - not sure cause, could be caused by high Ca, poor diet, high etoh intake Pancreatitis - may be related to etoh Hypercalcemia - not sure cause >  get vit D, pth , pth related protein and 1,25 vit D levels. Ca++ levels have corrected down to 9.6 today.  New  onset DM / severe DKA - per pmd AMS - multifactorial         Rob Vermelle Cammarata 01/14/2023, 8:54 AM   Recent Labs  Lab 01/12/23 1423 01/12/23 1434 01/12/23 1701 01/12/23 1932 01/13/23 0959 01/13/23 1140 01/13/23 2029 01/14/23 0259  HGB 17.0   < > 16.0  --  16.2  --   --   --   ALBUMIN 4.6  --   --   --   --   --   --   --   CALCIUM 12.2*   < >  --    < > 12.0*   < > 10.2 9.6  PHOS  --   --   --    < >  --    < > 5.9* 1.2*  CREATININE 3.61*   < >  --    < > 1.60*   < > 1.43* 1.46*  K 5.4*   < > 4.4   < > 3.3*   < >  5.3* 2.9*   < > = values in this interval not displayed.   Recent Labs  Lab 01/13/23 0003  IRON 39*  TIBC 284  FERRITIN 2,484*   Inpatient medications:  Chlorhexidine Gluconate Cloth  6 each Topical Daily   folic acid  1 mg Oral Daily   multivitamin with minerals  1 tablet Oral Daily   thiamine  100 mg Oral Daily   Or   thiamine  100 mg Intravenous Daily    sodium chloride 125 mL/hr at 01/14/23 0653   ceFEPime (MAXIPIME) IV Stopped (01/14/23 0132)   dextrose 165 mL/hr at 01/14/23 0653   insulin 10 Units/hr (01/14/23 0653)   metronidazole Stopped (01/14/23 0056)   potassium chloride 10 mEq (01/14/23 0826)   potassium PHOSPHATE IVPB (in mmol) 30 mmol (01/14/23 0743)   vancomycin Stopped (01/13/23 2054)   dextrose, LORazepam **OR** LORazepam, mouth rinse

## 2023-01-14 NOTE — Progress Notes (Signed)
PROGRESS NOTE SHANTEL WESELY  OZH:[id number] DOB: 1966-09-08 DOA: 01/12/2023 PCP: Loyal Jacobson, MD   Brief Narrative/Hospital Course: 57 year old male with hypertension elevated LFTs anxiety hyperlipidemia who has been having cough and upper respiratory symptoms for 2 and half weeks-had tested negative for COVID a week ago, presented with lethargy/confusion, tired. In the ED low-grade fever 100.2 tachycardic in the 120s, stable BP and oxygen saturation Labs obtained that showed blood sugar more than 1200, hyperkalemia, AKI w/ creatinine 3.6, transaminitis lactic acidosis 5.9 leukocytosis 18.7 BHB more than 8 COVID-19 influenza negative, lipase elevated 1194,CK4 39 troponin 44.  Normal folate and elevated B12, iron 39 but ferritin high at 2484. Imaging: Chest x-ray low lung volumes, CT chest abdomen abdomen pelvis w/o: Mild stranding/edema of pancreas concerning for pancreatitis, likely reactive/secondary duodenitis, hepatic steatosis, 2.6 cm left renal lesion. RVP panel pending Patient was aggressively volume resuscitated with 2 L bolus, was given vancomycin and Zosyn, placed on IV insulin drip, cefepime Flagyl and admitted for further management Critical care Dr Merrily Pew was consulted but advised stable for [facility] admission   Subjective: Seen and examined this morning Overnight afebrile Vs w/ BP in 150s, on RA Labs improving sodium, cbg in 140s CK up at 2005, vita D low 19, PTH intact low at 7 ca improved to 9.6 Nursing reports patient was irritated somewhat agitated and received Ativan earlier this morning.  He appears sleepy able to briefly wake up. Came back to check on him again this morning at 10.30> he was more alert awake  Assessment and Plan: Principal Problem:   DKA (diabetic ketoacidosis) (HCC) Active Problems:   AKI (acute kidney injury) (HCC)   Pancreatitis   Hypercalcemia   Elevated LFTs   SIRS (systemic inflammatory response syndrome) (HCC)   Acute metabolic  encephalopathy  New onset diabetes DKA History of prediabetes: Blood sugar more than 1200 with anion gap 33  in ED. DKA resolved with aggressive volume resuscitation and insulin drip.  Currently n.p.o. remains on insulin drip and aggressive fluid replacement for dehydration.  DM coordinator following once able to take p.o. we will transition to subcu insulin as insulin requirement decreases currently needing 20 units/h given D5 water use/DKA>so continue on insulin drip Recent Labs  Lab 01/14/23 0606 01/14/23 0718 01/14/23 0822 01/14/23 1021 01/14/23 1149  GLUCAP 161* 145* 142* 129* 136*  Pancreatitis lipase 166>1012: CT chest abdomen pelvis showed pancreatitis, does have history of alcohol abuse, likely the etiology. TG level normal.  No significant abdominal pain or tenderness on during previous examination currently sedated.  Continue aggressive fluid resuscitation, GI is following closely.Ferritin high in 2400   History of alcohol use 3-5 drinks per week:At risk of withdrawal continue with CIWA scale Ativan, thiamine folate/multivitamin.  Ethanol level negative drug screen negative. He drinks regularly- ~2 drinks at least every other day last drink about a week ago as per family  Acute metabolic encephalopathy in the setting of patient's DKA/SIRS possible alcohol withdrawal: This morning sedated with Ativan use.  Continue Ativan as needed per scale, continue supportive care, delirium precaution fall precaution.    AKI: In the setting of volume depletion with DKA, AKI resolving nicely with fluids nephrology following renal ultrasound no hydronephrosis borderline mild renal enlargement Recent Labs    01/12/23 1423 01/12/23 1600 01/12/23 1932 01/13/23 0028 01/13/23 0253 01/13/23 0959 01/13/23 1140 01/13/23 1354 01/13/23 2029 01/14/23 0259  BUN 90* 88* 76* 66* 64* 55* 52* 51* 43* 38*  CREATININE 3.61* 3.37* 2.61* 2.27* 2.04* 1.60* 1.53*  1.46* 1.43* 1.46*    Severe dehydration with  DKA with free water deficit Hypernatremia: Patient's sodium was in normal range which is false in the setting of hyperglycemia with blood sugar more than 1200.  With correction of glucose sodium trended up, nephrology consulted added aggressive volume resuscitation with bolus along with D5W and NS infusion and sodium is resolving nicely. Recent Labs  Lab 01/13/23 0959 01/13/23 1140 01/13/23 1354 01/13/23 2029 01/14/23 0259  NA 162* 163* 164* 159* 157*      Hypophosphatemia:In the setting of DKA/insulin use, will replete with K-Phos again this morning and recheck later today Hypercalcemia-in the setting of severe dehydration: PTH appropriately suppressed, calcium has normalized.  Hyperkalemia resolved monitor Hypokalemia again being replaced to a K-Phos and potassium chloride will recheck later today Discussed with pharmacy for electrolyte replacement Recent Labs  Lab 01/13/23 0028 01/13/23 0253 01/13/23 0959 01/13/23 1140 01/13/23 1354 01/13/23 2029 01/14/23 0259  K 4.0 3.7 3.3* 3.3* 3.3* 5.3* 2.9*  CALCIUM 12.4* 12.4* 12.0* 12.1* 11.6*  12.1* 10.2 9.6  MG 3.7* 3.5*  --   --   --   --  1.9  PHOS 3.0 1.9*  --  1.1*  --  5.9* 1.2*    Transaminitis: With history of alcohol use, hepatic steatosis on imaging.Monitor LFTs GI following. Acute viral hepatitis panel negative Recent Labs  Lab 01/12/23 1423 01/12/23 1600 01/13/23 0003 01/13/23 0031 01/13/23 0253 01/13/23 0959 01/14/23 0259  AST 66*  --   --   --   --   --  89*  ALT 96*  --   --   --   --   --  52*  ALKPHOS 76  --   --   --   --   --  47  BILITOT 1.8*  --   --   --   --   --  0.6  BILIDIR  --   --   --   --   --   --  0.1  IBILI  --   --   --   --   --   --  0.5  PROT 9.7*  --   --   --   --   --  6.3*  ALBUMIN 4.6  --   --   --   --   --  2.9*  AMMONIA  --   --  33  --   --   --   --   INR 1.3*  --   --   --   --   --   --   LIPASE  --  1,194*  --  166* 1,012*  --   --   PLT 325  --   --   --   --  238 159       Latest Ref Rng & Units 01/13/2023    2:53 AM  Hepatitis  Hep B Surface Ag NON REACTIVE NON REACTIVE   Hep B IgM NON REACTIVE NON REACTIVE   Hep C Ab NON REACTIVE NON REACTIVE   Hep A IgM NON REACTIVE NON REACTIVE    Rhabdomyolysis: CK trending up again continue aggressive fluid,monitor CK level as below  Recent Labs  Lab 01/13/23 0031 01/14/23 0259  CKTOTAL 439* 2,005*  Elevated troponin: Patient without chest pain likely demand ischemia in the setting of severe electrolyte imbalance.  Trend troponin  SIRS Leucocytosis Lactic acidosis: + procal 3.7>Chest x-ray CT chest on pelvis no obvious infection  noted UA WBC 0-5.Suspect this is due to patient's dehydration/dka/pancreatitis> but ruling out infection-continue current empiric antibiotics.  Blood culture no growth so far, WBC downtrending. We will discontinue vancomycin to minimize nephrotoxic medications per recommendation from nephrology.  Recent Labs  Lab 01/12/23 1423 01/12/23 1602 01/12/23 1932 01/13/23 0003 01/13/23 0031 01/13/23 0959 01/14/23 0259  WBC 18.7*  --   --   --   --  19.8* 16.3*  LATICACIDVEN 5.9* 6.8* 2.5* 2.3*  --  1.6  --   PROCALCITON  --   --   --   --  3.79  --   --   DVT prophylaxis: SCDs Start: 01/12/23 2346 Code Status:   Code Status: Full Code Family Communication: plan of care discussed with patient/no family at bedside I had updated patient's wife at the bedside yesterday evening. Patient status is: Inpatient because of DKA/severe electrolyte derangement and encephalopathy  Level of care: Stepdown  Dispo: The patient is from: home            Anticipated disposition: TBD Objective: Vitals last 24 hrs: Vitals:   01/14/23 0730 01/14/23 0800 01/14/23 0900 01/14/23 1152  BP:  (!) 166/94 (!) 150/98   Pulse: 97 94 87   Resp: (!) 30 (!) 27 (!) 29   Temp:  100 F (37.8 C)  98.9 F (37.2 C)  TempSrc:  Axillary  Axillary  SpO2: 97% 99% 96%   Weight:      Height:       Weight change:    Physical Examination: General exam: Sleepy/lethargic able to briefly wake up and goes back to sleep.   HEENT:Oral mucosa moist, Ear/Nose WNL grossly, dentition normal. Respiratory system: bilaterally clear BS, on RA,  no use of accessory muscle Cardiovascular system: S1 & S2 +, regular rate, JVD NEG. Gastrointestinal system: Abdomen soft, NT,ND,BS+ Nervous System:sleepy, but moving extremities and grossly nonfocal Extremities: LE ankle edema NEG, lower extremities warm Skin: No rashes,no icterus. MSK: Normal muscle bulk,tone, power   Medications reviewed:  Scheduled Meds:  Chlorhexidine Gluconate Cloth  6 each Topical Daily   folic acid  1 mg Oral Daily   multivitamin with minerals  1 tablet Oral Daily   thiamine  100 mg Oral Daily   Or   thiamine  100 mg Intravenous Daily   Continuous Infusions:  sodium chloride 125 mL/hr at 01/14/23 0653   ceFEPime (MAXIPIME) IV Stopped (01/14/23 0132)   dextrose 165 mL/hr at 01/14/23 1228   insulin 10 Units/hr (01/14/23 0653)   metronidazole Stopped (01/14/23 0056)   potassium chloride 10 mEq (01/14/23 1150)   potassium PHOSPHATE IVPB (in mmol) 30 mmol (01/14/23 0743)   vancomycin Stopped (01/13/23 2054)    Diet Order             Diet NPO time specified  Diet effective now                   Intake/Output Summary (Last 24 hours) at 01/14/2023 1246 Last data filed at 01/14/2023 1150 Gross per 24 hour  Intake 6186.44 ml  Output 1750 ml  Net 4436.44 ml   Net IO Since Admission: 3,574.33 mL [01/14/23 1246]  Wt Readings from Last 3 Encounters:  01/12/23 81.5 kg  07/31/20 88.5 kg  09/27/19 88.5 kg     Unresulted Labs (From admission, onward)     Start     Ordered   01/15/23 0500  CBC  Daily,   R     Question:  Specimen  collection method  Answer:  Lab=Lab collect   01/14/23 0747   01/15/23 0500  Phosphorus  Tomorrow morning,   R       Question:  Specimen collection method  Answer:  Lab=Lab collect   01/14/23 0848    01/15/23 0500  Comprehensive metabolic panel  Daily,   R     Question:  Specimen collection method  Answer:  Lab=Lab collect   01/14/23 0853   01/15/23 0500  Lipase, blood  Tomorrow morning,   R       Question:  Specimen collection method  Answer:  Lab=Lab collect   01/14/23 1245   01/15/23 0500  CK  Daily,   R     Question:  Specimen collection method  Answer:  Lab=Lab collect   01/14/23 1245   01/14/23 1900  Phosphorus  Once-Timed,   TIMED       Question:  Specimen collection method  Answer:  Lab=Lab collect   01/14/23 0848   01/14/23 1500  Basic metabolic panel  Once-Timed,   TIMED       Question:  Specimen collection method  Answer:  Lab=Lab collect   01/14/23 0852   01/13/23 1703  Calcitriol (1,25 di-OH Vit D)  Once,   R       Question:  Specimen collection method  Answer:  Lab=Lab collect   01/13/23 1702   01/13/23 1702  PTH-related peptide  Once,   R       Question:  Specimen collection method  Answer:  Lab=Lab collect   01/13/23 1702   01/13/23 0500  C-peptide  Tomorrow morning,   R        01/12/23 2338   01/13/23 0500  CBC with Differential  Tomorrow morning,   R        01/12/23 2343   01/13/23 0028  Hemoglobin A1c  Once,   R        01/13/23 0028          Data Reviewed: I have personally reviewed following labs and imaging studies CBC: Recent Labs  Lab 01/12/23 1423 01/12/23 1434 01/12/23 1701 01/13/23 0959 01/14/23 0259  WBC 18.7*  --   --  19.8* 16.3*  NEUTROABS 16.8*  --   --  17.9*  --   HGB 17.0 18.0* 16.0 16.2 14.0  HCT 53.4* 53.0* 47.0 48.6 44.0  MCV 99.1  --   --  94.2 98.9  PLT 325  --   --  238 159   Basic Metabolic Panel: Recent Labs  Lab 01/13/23 0028 01/13/23 0253 01/13/23 0959 01/13/23 1140 01/13/23 1354 01/13/23 2029 01/14/23 0259  NA 151* 155* 162* 163* 164* 159* 157*  K 4.0 3.7 3.3* 3.3* 3.3* 5.3* 2.9*  CL 110 118* 124* 126* 128* 126* 128*  CO2 25 26 27 27 24 24  20*  GLUCOSE 726* 567* 318* 277* 241* 237* 302*  BUN 66* 64* 55*  52* 51* 43* 38*  CREATININE 2.27* 2.04* 1.60* 1.53* 1.46* 1.43* 1.46*  CALCIUM 12.4* 12.4* 12.0* 12.1* 11.6*  12.1* 10.2 9.6  MG 3.7* 3.5*  --   --   --   --  1.9  PHOS 3.0 1.9*  --  1.1*  --  5.9* 1.2*   GFR: Estimated Creatinine Clearance: 56.8 mL/min (A) (by C-G formula based on SCr of 1.46 mg/dL (H)). Liver Function Tests: Recent Labs  Lab 01/12/23 1423 01/14/23 0259  AST 66* 89*  ALT 96* 52*  ALKPHOS 76 47  BILITOT  1.8* 0.6  PROT 9.7* 6.3*  ALBUMIN 4.6 2.9*   Recent Labs  Lab 01/12/23 1600 01/13/23 0031 01/13/23 0253  LIPASE 1,194* 166* 1,012*   Recent Labs  Lab 01/13/23 0003  AMMONIA 33   Coagulation Profile: Recent Labs  Lab 01/12/23 1423  INR 1.3*   Recent Labs    01/13/23 0003  TSH 0.357   Sepsis Labs: Recent Labs  Lab 01/12/23 1602 01/12/23 1932 01/13/23 0003 01/13/23 0031 01/13/23 0959  PROCALCITON  --   --   --  3.79  --   LATICACIDVEN 6.8* 2.5* 2.3*  --  1.6    Recent Results (from the past 240 hour(s))  Blood Culture (routine x 2)     Status: None (Preliminary result)   Collection Time: 01/12/23  2:23 PM   Specimen: Left Antecubital; Blood  Result Value Ref Range Status   Specimen Description   Final    LEFT ANTECUBITAL BLOOD Performed at [facility], [address] [place], [place] [address]    Special Requests   Final    Blood Culture adequate volume BOTTLES DRAWN AEROBIC AND ANAEROBIC Performed at [facility], [address] [place], [place] [address]    Culture   Final    NO GROWTH 2 DAYS Performed at [facility], [address], [place], [place] [address]    Report Status PENDING  Incomplete  Resp panel by RT-PCR (RSV, Flu A&B, Covid) Anterior Nasal Swab     Status: None   Collection Time: 01/12/23  2:30 PM   Specimen: Anterior Nasal Swab  Result Value Ref Range Status   SARS Coronavirus 2 by RT PCR NEGATIVE NEGATIVE Final    Comment: (NOTE) SARS-CoV-2 target nucleic acids are NOT  DETECTED.  The SARS-CoV-2 RNA is generally detectable in upper respiratory specimens during the acute phase of infection. The lowest concentration of SARS-CoV-2 viral copies this assay can detect is 138 copies/mL. A negative result does not preclude SARS-Cov-2 infection and should not be used as the sole basis for treatment or other patient management decisions. A negative result may occur with  improper specimen collection/handling, submission of specimen other than nasopharyngeal swab, presence of viral mutation(s) within the areas targeted by this assay, and inadequate number of viral copies(<138 copies/mL). A negative result must be combined with clinical observations, patient history, and epidemiological information. The expected result is Negative.  Fact Sheet for Patients:  BloggerCourse.com  Fact Sheet for Healthcare Providers:  SeriousBroker.it  This test is no t yet approved or cleared by the [place] FDA and  has been authorized for detection and/or diagnosis of SARS-CoV-2 by FDA under an Emergency Use Authorization (EUA). This EUA will remain  in effect (meaning this test can be used) for the duration of the COVID-19 declaration under Section 564(b)(1) of the Act, 21 U.S.C.section 360bbb-3(b)(1), unless the authorization is terminated  or revoked sooner.       Influenza A by PCR NEGATIVE NEGATIVE Final   Influenza B by PCR NEGATIVE NEGATIVE Final    Comment: (NOTE) The Xpert Xpress SARS-CoV-2/FLU/RSV plus assay is intended as an aid in the diagnosis of influenza from Nasopharyngeal swab specimens and should not be used as a sole basis for treatment. Nasal washings and aspirates are unacceptable for Xpert Xpress SARS-CoV-2/FLU/RSV testing.  Fact Sheet for Patients: BloggerCourse.com  Fact Sheet for Healthcare Providers: SeriousBroker.it  This test is not yet  approved or cleared by the [place] FDA  and has been authorized for detection and/or diagnosis of SARS-CoV-2 by FDA under an Emergency Use Authorization (EUA). This EUA will remain in effect (meaning this test can be used) for the duration of the COVID-19 declaration under Section 564(b)(1) of the Act, 21 U.S.C. section 360bbb-3(b)(1), unless the authorization is terminated or revoked.     Resp Syncytial Virus by PCR NEGATIVE NEGATIVE Final    Comment: (NOTE) Fact Sheet for Patients: BloggerCourse.comhttps://www.fda.gov/media/152166/download  Fact Sheet for Healthcare Providers: SeriousBroker.ithttps://www.fda.gov/media/152162/download  This test is not yet approved or cleared by the [place] FDA and has been authorized for detection and/or diagnosis of SARS-CoV-2 by FDA under an Emergency Use Authorization (EUA). This EUA will remain in effect (meaning this test can be used) for the duration of the COVID-19 declaration under Section 564(b)(1) of the Act, 21 U.S.C. section 360bbb-3(b)(1), unless the authorization is terminated or revoked.  Performed at [facility] [place], [address] [place], [place] [address]   Blood Culture (routine x 2)     Status: None (Preliminary result)   Collection Time: 01/12/23  2:37 PM   Specimen: BLOOD RIGHT HAND  Result Value Ref Range Status   Specimen Description   Final    BLOOD RIGHT HAND BLOOD Performed at [facility] [place], [address] [place], [place] [address]    Special Requests   Final    Blood Culture adequate volume BOTTLES DRAWN AEROBIC AND ANAEROBIC Performed at [facility], [address] [place], [place] [address]    Culture   Final    NO GROWTH 2 DAYS Performed at [facility], [address] [place], [place] [address]    Report Status PENDING  Incomplete  Urine Culture     Status: None   Collection Time: 01/12/23  4:43 PM   Specimen: In/Out Cath Urine  Result Value Ref Range Status   Specimen Description    Final    IN/OUT CATH URINE Performed at [facility] [place], [address] [place], [place] [address]    Special Requests   Final    NONE Performed at [facility] [place], [address] [place], [place] [address]    Culture   Final    NO GROWTH Performed at [facility], [address] [place], [place] [address]    Report Status 01/13/2023 FINAL  Final  MRSA Next Gen by PCR, Nasal     Status: None   Collection Time: 01/12/23 11:06 PM  Result Value Ref Range Status   MRSA by PCR Next Gen NOT DETECTED NOT DETECTED Final    Comment: (NOTE) The GeneXpert MRSA Assay (FDA approved for NASAL specimens only), is one component of a comprehensive MRSA colonization surveillance program. It is not intended to diagnose MRSA infection nor to guide or monitor treatment for MRSA infections. Test performance is not FDA approved in patients less than 58 years old. Performed at [facility], [address] [place], [place] [address]   Respiratory (~20 pathogens) panel by PCR     Status: None   Collection Time: 01/12/23 11:52 PM   Specimen: Nasopharyngeal Swab; Respiratory  Result Value Ref Range Status   Adenovirus NOT DETECTED NOT DETECTED Final   Coronavirus 229E NOT DETECTED NOT DETECTED Final    Comment: (NOTE) The Coronavirus on the Respiratory Panel, DOES NOT test for the novel  Coronavirus (2019 nCoV)    Coronavirus HKU1 NOT DETECTED NOT  DETECTED Final   Coronavirus NL63 NOT DETECTED NOT DETECTED Final   Coronavirus OC43 NOT DETECTED NOT DETECTED Final   Metapneumovirus NOT DETECTED NOT DETECTED Final   Rhinovirus / Enterovirus NOT DETECTED NOT DETECTED Final   Influenza A NOT DETECTED NOT DETECTED Final   Influenza B NOT DETECTED NOT DETECTED Final   Parainfluenza Virus 1 NOT DETECTED NOT DETECTED Final   Parainfluenza Virus 2 NOT DETECTED NOT DETECTED Final   Parainfluenza Virus 3 NOT DETECTED NOT DETECTED Final   Parainfluenza Virus 4 NOT  DETECTED NOT DETECTED Final   Respiratory Syncytial Virus NOT DETECTED NOT DETECTED Final   Bordetella pertussis NOT DETECTED NOT DETECTED Final   Bordetella Parapertussis NOT DETECTED NOT DETECTED Final   Chlamydophila pneumoniae NOT DETECTED NOT DETECTED Final   Mycoplasma pneumoniae NOT DETECTED NOT DETECTED Final    Comment: Performed at [facility], [address] [place], [place] [address]    Antimicrobials: Anti-infectives (From admission, onward)    Start     Dose/Rate Route Frequency Ordered Stop   01/13/23 1800  vancomycin (VANCOREADY) IVPB 1500 mg/300 mL        1,500 mg 150 mL/hr over 120 Minutes Intravenous Every 24 hours 01/13/23 1404 01/19/23 1759   01/13/23 0100  ceFEPIme (MAXIPIME) 2 g in sodium chloride 0.9 % 100 mL IVPB        2 g 200 mL/hr over 30 Minutes Intravenous Every 12 hours 01/13/23 0002     01/13/23 0030  metroNIDAZOLE (FLAGYL) IVPB 500 mg        500 mg 100 mL/hr over 60 Minutes Intravenous Every 12 hours 01/12/23 2343 01/20/23 0029   01/12/23 1740  vancomycin variable dose per unstable renal function (pharmacist dosing)  Status:  Discontinued         Does not apply See admin instructions 01/12/23 1740 01/13/23 1406   01/12/23 1630  vancomycin (VANCOCIN) 500 mg in sodium chloride 0.9 % 100 mL IVPB       See Hyperspace for full Linked Orders Report.   500 mg 100 mL/hr over 60 Minutes Intravenous  Once 01/12/23 1523 01/12/23 1818   01/12/23 1530  vancomycin (VANCOCIN) IVPB 1000 mg/200 mL premix       See Hyperspace for full Linked Orders Report.   1,000 mg 200 mL/hr over 60 Minutes Intravenous  Once 01/12/23 1523 01/12/23 1652   01/12/23 1500  piperacillin-tazobactam (ZOSYN) IVPB 3.375 g        3.375 g 100 mL/hr over 30 Minutes Intravenous  Once 01/12/23 1450 01/12/23 1530      Culture/Microbiology    Component Value Date/Time   SDES  01/12/2023 1643    IN/OUT CATH URINE Performed at [facility], [address] [place],  [place] [address]    [facility]  01/12/2023 1643    NONE Performed at [facility], [address], [place], [place] [address]    CULT  01/12/2023 1643    NO GROWTH Performed at [facility], [address], [place], [place] [address]    REPTSTATUS 01/13/2023 FINAL 01/12/2023 1643    Other culture-see note  Radiology Studies: US RENAL  Result Date: 01/13/2023 CLINICAL DATA:  Acute kidney injury EXAM: RENAL / URINARY TRACT ULTRASOUND COMPLETE COMPARISON:  CT 01/12/2023 FINDINGS: Right Kidney: Renal measurements: 10.9 x 5.5 x 6.3 cm = volume: 199 mL. Echogenicity within normal limits. No mass or hydronephrosis visualized. Left Kidney: Renal measurements: 12.3 x 6.4 x  7.3 cm = volume: 298 mL. Echogenicity within normal limits. No hydronephrosis. 3.4 x 2.8 x 2.6 cm cyst in the lower pole with mild septation. No follow-up recommended. Bladder: Appears normal for degree of bladder distention. Other: None. IMPRESSION: No hydronephrosis. Borderline mild renal enlargement which could go along with acute nephritis. Electronically Signed   By: Paulina FusiMark  Shogry M.D.   On: 01/13/2023 08:01   CT Head Wo Contrast  Result Date: 01/12/2023 CLINICAL DATA:  Mental status change, unknown cause EXAM: CT HEAD WITHOUT CONTRAST TECHNIQUE: Contiguous axial images were obtained from the base of the skull through the vertex without intravenous contrast. RADIATION DOSE REDUCTION: This exam was performed according to the departmental dose-optimization program which includes automated exposure control, adjustment of the mA and/or kV according to patient size and/or use of iterative reconstruction technique. COMPARISON:  None Available. FINDINGS: Brain: No evidence of acute infarction, hemorrhage, hydrocephalus, extra-axial collection or mass lesion/mass effect. Mild patchy white matter hypodensities, nonspecific but compatible with chronic microvascular ischemic disease. Vascular: No hyperdense vessel identified. Skull:  No acute fracture. Sinuses/Orbits: Mild paranasal sinus mucosal thickening. No acute orbital findings. Other: No mastoid effusions. IMPRESSION: No evidence of acute intracranial abnormality. Electronically Signed   By: Feliberto HartsFrederick S Jones M.D.   On: 01/12/2023 16:56   CT CHEST ABDOMEN PELVIS WO CONTRAST  Result Date: 01/12/2023 CLINICAL DATA:  Sepsis EXAM: CT CHEST, ABDOMEN AND PELVIS WITHOUT CONTRAST TECHNIQUE: Multidetector CT imaging of the chest, abdomen and pelvis was performed following the standard protocol without IV contrast. RADIATION DOSE REDUCTION: This exam was performed according to the departmental dose-optimization program which includes automated exposure control, adjustment of the mA and/or kV according to patient size and/or use of iterative reconstruction technique. COMPARISON:  None Available. FINDINGS: CT CHEST FINDINGS Cardiovascular: Calcific atherosclerosis of the coronary arteries. Normal heart size. No pericardial effusion. Mediastinum/Nodes: No enlarged mediastinal, hilar, or axillary lymph nodes. Thyroid gland, trachea, and esophagus demonstrate no significant findings. Lungs/Pleura: Lungs are clear. No pleural effusion or pneumothorax. Musculoskeletal: No chest wall mass or suspicious bone lesions identified. CT ABDOMEN PELVIS FINDINGS Hepatobiliary: Diffuse low-attenuation liver, compatible with hepatic steatosis. No visible mass lesion. Gallbladder is hyperdense but otherwise unremarkable. Pancreas: Mild stranding/edema around the pancreas, particularly the pancreatic head. Spleen: Normal in size without focal abnormality. Adrenals/Urinary Tract: Adrenal glands are unremarkable. No hydronephrosis. Approximately 2.6 cm hypoattenuating left renal lesion with Hounsfield unit measurements that are indeterminate in areas. Stomach/Bowel: Mild thickening of the duodenum in the region the pancreas. Otherwise, unremarkable appearance of bowel loops. No dilated bowel loops to suggest  obstruction. Normal appendix. Vascular/Lymphatic: No significant vascular findings are present. No enlarged abdominal or pelvic lymph nodes. Reproductive: Prostate is unremarkable. Other: Mild laxity anterior abdominal wall.  No significant ascites. Musculoskeletal: L5-S1 degenerative change. IMPRESSION: 1. Mild stranding/edema around the pancreas, concerning for pancreatitis. No discrete, drainable fluid collection. Correlate with lipase. 2. Mild thickening of the proximal duodenum in the region of the pancreas, likely secondary/reactive change. 3. Hepatic steatosis. 4. Indeterminate 2.6 cm left renal lesion. This may represent a cyst but recommend non-urgent follow-up renal ultrasound to better characterize. Electronically Signed   By: Feliberto HartsFrederick S Jones M.D.   On: 01/12/2023 16:55   DG Chest Port 1 View  Result Date: 01/12/2023 CLINICAL DATA:  Cough and upper respiratory infection. EXAM: PORTABLE CHEST 1 VIEW COMPARISON:  07/31/2020 FINDINGS: 1417 hours. Low volume film. The lungs are clear without focal pneumonia, edema, pneumothorax or pleural effusion. The cardiopericardial silhouette is within normal limits  for size. The visualized bony structures of the thorax are unremarkable. Telemetry leads overlie the chest. IMPRESSION: Low volume film without acute cardiopulmonary findings. Electronically Signed   By: Kennith Center M.D.   On: 01/12/2023 14:39     LOS: 2 days   Lanae Boast, MD [facility]  01/14/2023, 12:46 PM

## 2023-01-15 ENCOUNTER — Inpatient Hospital Stay (HOSPITAL_COMMUNITY): Payer: 59

## 2023-01-15 ENCOUNTER — Inpatient Hospital Stay: Payer: Self-pay

## 2023-01-15 DIAGNOSIS — R4182 Altered mental status, unspecified: Secondary | ICD-10-CM

## 2023-01-15 DIAGNOSIS — E1110 Type 2 diabetes mellitus with ketoacidosis without coma: Secondary | ICD-10-CM | POA: Diagnosis not present

## 2023-01-15 LAB — BLOOD GAS, ARTERIAL
Acid-base deficit: 4.3 mmol/L — ABNORMAL HIGH (ref 0.0–2.0)
Bicarbonate: 18.1 mmol/L — ABNORMAL LOW (ref 20.0–28.0)
Drawn by: 560031
O2 Saturation: 98.7 %
Patient temperature: 36.6
pCO2 arterial: 26 mmHg — ABNORMAL LOW (ref 32–48)
pH, Arterial: 7.46 — ABNORMAL HIGH (ref 7.35–7.45)
pO2, Arterial: 84 mmHg (ref 83–108)

## 2023-01-15 LAB — GLUCOSE, CAPILLARY
Glucose-Capillary: 112 mg/dL — ABNORMAL HIGH (ref 70–99)
Glucose-Capillary: 130 mg/dL — ABNORMAL HIGH (ref 70–99)
Glucose-Capillary: 137 mg/dL — ABNORMAL HIGH (ref 70–99)
Glucose-Capillary: 147 mg/dL — ABNORMAL HIGH (ref 70–99)
Glucose-Capillary: 149 mg/dL — ABNORMAL HIGH (ref 70–99)
Glucose-Capillary: 149 mg/dL — ABNORMAL HIGH (ref 70–99)
Glucose-Capillary: 150 mg/dL — ABNORMAL HIGH (ref 70–99)
Glucose-Capillary: 154 mg/dL — ABNORMAL HIGH (ref 70–99)
Glucose-Capillary: 155 mg/dL — ABNORMAL HIGH (ref 70–99)
Glucose-Capillary: 156 mg/dL — ABNORMAL HIGH (ref 70–99)
Glucose-Capillary: 159 mg/dL — ABNORMAL HIGH (ref 70–99)
Glucose-Capillary: 166 mg/dL — ABNORMAL HIGH (ref 70–99)
Glucose-Capillary: 169 mg/dL — ABNORMAL HIGH (ref 70–99)
Glucose-Capillary: 169 mg/dL — ABNORMAL HIGH (ref 70–99)
Glucose-Capillary: 181 mg/dL — ABNORMAL HIGH (ref 70–99)
Glucose-Capillary: 197 mg/dL — ABNORMAL HIGH (ref 70–99)

## 2023-01-15 LAB — CBC
HCT: 39.7 % (ref 39.0–52.0)
Hemoglobin: 12.6 g/dL — ABNORMAL LOW (ref 13.0–17.0)
MCH: 31.7 pg (ref 26.0–34.0)
MCHC: 31.7 g/dL (ref 30.0–36.0)
MCV: 99.7 fL (ref 80.0–100.0)
Platelets: 100 10*3/uL — ABNORMAL LOW (ref 150–400)
RBC: 3.98 MIL/uL — ABNORMAL LOW (ref 4.22–5.81)
RDW: 12.5 % (ref 11.5–15.5)
WBC: 12.8 10*3/uL — ABNORMAL HIGH (ref 4.0–10.5)
nRBC: 0 % (ref 0.0–0.2)

## 2023-01-15 LAB — BASIC METABOLIC PANEL
Anion gap: 9 (ref 5–15)
Anion gap: 8 (ref 5–15)
Anion gap: 6 (ref 5–15)
BUN: 18 mg/dL (ref 6–20)
BUN: 21 mg/dL — ABNORMAL HIGH (ref 6–20)
BUN: 19 mg/dL (ref 6–20)
CO2: 21 mmol/L — ABNORMAL LOW (ref 22–32)
CO2: 18 mmol/L — ABNORMAL LOW (ref 22–32)
CO2: 19 mmol/L — ABNORMAL LOW (ref 22–32)
Calcium: 8.1 mg/dL — ABNORMAL LOW (ref 8.9–10.3)
Calcium: 8.1 mg/dL — ABNORMAL LOW (ref 8.9–10.3)
Calcium: 7.7 mg/dL — ABNORMAL LOW (ref 8.9–10.3)
Chloride: 119 mmol/L — ABNORMAL HIGH (ref 98–111)
Chloride: 122 mmol/L — ABNORMAL HIGH (ref 98–111)
Chloride: 118 mmol/L — ABNORMAL HIGH (ref 98–111)
Creatinine, Ser: 1.13 mg/dL (ref 0.61–1.24)
Creatinine, Ser: 0.99 mg/dL (ref 0.61–1.24)
Creatinine, Ser: 0.99 mg/dL (ref 0.61–1.24)
GFR, Estimated: >60 mL/min (ref >60)
GFR, Estimated: >60 mL/min (ref >60)
GFR, Estimated: >60 mL/min (ref >60)
Glucose, Bld: 145 mg/dL — ABNORMAL HIGH (ref 70–99)
Glucose, Bld: 185 mg/dL — ABNORMAL HIGH (ref 70–99)
Glucose, Bld: 179 mg/dL — ABNORMAL HIGH (ref 70–99)
Potassium: 2.9 mmol/L — ABNORMAL LOW (ref 3.5–5.1)
Potassium: 4 mmol/L (ref 3.5–5.1)
Potassium: 3.2 mmol/L — ABNORMAL LOW (ref 3.5–5.1)
Sodium: 149 mmol/L — ABNORMAL HIGH (ref 135–145)
Sodium: 148 mmol/L — ABNORMAL HIGH (ref 135–145)
Sodium: 143 mmol/L (ref 135–145)

## 2023-01-15 LAB — CALCITRIOL (1,25 DI-OH VIT D): Vit D, 1,25-Dihydroxy: 11.7 pg/mL — ABNORMAL LOW (ref 24.8–81.5)

## 2023-01-15 LAB — COMPREHENSIVE METABOLIC PANEL
ALT: 42 U/L (ref 0–44)
AST: 74 U/L — ABNORMAL HIGH (ref 15–41)
Albumin: 2.6 g/dL — ABNORMAL LOW (ref 3.5–5.0)
Alkaline Phosphatase: 45 U/L (ref 38–126)
Anion gap: 7 (ref 5–15)
BUN: 20 mg/dL (ref 6–20)
CO2: 19 mmol/L — ABNORMAL LOW (ref 22–32)
Calcium: 8 mg/dL — ABNORMAL LOW (ref 8.9–10.3)
Chloride: 119 mmol/L — ABNORMAL HIGH (ref 98–111)
Creatinine, Ser: 1.01 mg/dL (ref 0.61–1.24)
GFR, Estimated: >60 mL/min (ref >60)
Glucose, Bld: 159 mg/dL — ABNORMAL HIGH (ref 70–99)
Potassium: 5 mmol/L (ref 3.5–5.1)
Sodium: 145 mmol/L (ref 135–145)
Total Bilirubin: 1 mg/dL (ref 0.3–1.2)
Total Protein: 6.1 g/dL — ABNORMAL LOW (ref 6.5–8.1)

## 2023-01-15 LAB — PHOSPHORUS: Phosphorus: 2.7 mg/dL (ref 2.5–4.6)

## 2023-01-15 LAB — MAGNESIUM: Magnesium: 1.5 mg/dL — ABNORMAL LOW (ref 1.7–2.4)

## 2023-01-15 LAB — CK: Total CK: 1269 U/L — ABNORMAL HIGH (ref 49–397)

## 2023-01-15 LAB — AMMONIA: Ammonia: 54 umol/L — ABNORMAL HIGH (ref 9–35)

## 2023-01-15 LAB — LIPASE, BLOOD: Lipase: 147 U/L — ABNORMAL HIGH (ref 11–51)

## 2023-01-15 MED ORDER — ENOXAPARIN SODIUM 40 MG/0.4ML IJ SOSY
40.0000 mg | PREFILLED_SYRINGE | INTRAMUSCULAR | Status: DC
  Administered 2023-01-15 – 2023-01-22 (×8): 40 mg via SUBCUTANEOUS
  Filled 2023-01-15 (×8): qty 0.4

## 2023-01-15 MED ORDER — SODIUM CHLORIDE 0.9% FLUSH
10.0000 mL | Freq: Two times a day (BID) | INTRAVENOUS | Status: DC
  Administered 2023-01-15: 10 mL
  Administered 2023-01-15: 20 mL
  Administered 2023-01-16: 10 mL
  Administered 2023-01-16: 40 mL
  Administered 2023-01-17 – 2023-01-18 (×4): 10 mL
  Administered 2023-01-19: 20 mL
  Administered 2023-01-19 – 2023-01-21 (×5): 10 mL
  Administered 2023-01-22: 20 mL

## 2023-01-15 MED ORDER — DEXTROSE 5 % IV SOLN: INTRAVENOUS | Status: DC

## 2023-01-15 MED ORDER — POTASSIUM CHLORIDE 20 MEQ PO PACK: 20.0000 meq | PACK | ORAL | Status: DC

## 2023-01-15 MED ORDER — MAGNESIUM SULFATE 4 GM/100ML IV SOLN
4.0000 g | Freq: Once | INTRAVENOUS | Status: AC
  Administered 2023-01-16: 4 g via INTRAVENOUS
  Filled 2023-01-15: qty 100

## 2023-01-15 MED ORDER — SODIUM CHLORIDE 0.9% FLUSH
10.0000 mL | INTRAVENOUS | Status: DC | PRN
  Administered 2023-01-17: 10 mL

## 2023-01-15 MED ORDER — THIAMINE HCL 100 MG/ML IJ SOLN
400.0000 mg | Freq: Three times a day (TID) | INTRAVENOUS | Status: AC
  Administered 2023-01-15 – 2023-01-17 (×9): 400 mg via INTRAVENOUS
  Filled 2023-01-15 (×10): qty 4

## 2023-01-15 MED ORDER — POTASSIUM CHLORIDE 10 MEQ/50ML IV SOLN
10.0000 meq | INTRAVENOUS | Status: AC
  Administered 2023-01-16 (×4): 10 meq via INTRAVENOUS
  Filled 2023-01-15 (×4): qty 50

## 2023-01-15 MED ORDER — THIAMINE HCL 100 MG/ML IJ SOLN: 400.0000 mg | Freq: Three times a day (TID) | INTRAVENOUS | Status: DC

## 2023-01-15 MED ORDER — POTASSIUM CHLORIDE 10 MEQ/100ML IV SOLN
10.0000 meq | INTRAVENOUS | Status: AC
  Administered 2023-01-15 (×6): 10 meq via INTRAVENOUS
  Filled 2023-01-15 (×6): qty 100

## 2023-01-15 MED ORDER — THIAMINE HCL 100 MG/ML IJ SOLN
100.0000 mg | Freq: Three times a day (TID) | INTRAMUSCULAR | Status: AC
  Administered 2023-01-18 – 2023-01-20 (×9): 100 mg via INTRAVENOUS
  Filled 2023-01-15 (×9): qty 2

## 2023-01-15 NOTE — Progress Notes (Signed)
Peripherally Inserted Central Catheter Placement  The primary nurse has discussed with the patient and/or persons authorized to consent for the patient, the purpose of this procedure and the potential benefits and risks involved with this procedure.  The benefits include less needle sticks, lab draws from the catheter, and the patient may be discharged home with the catheter. Risks include, but not limited to, infection, bleeding, blood clot (thrombus formation), and puncture of an artery; nerve damage and irregular heartbeat and possibility to perform a PICC exchange if needed/ordered by physician.  Alternatives to this procedure were also discussed.  Bard Power PICC patient education guide, fact sheet on infection prevention and patient information card has been provided to patient /or left at bedside.    PICC Placement Documentation  PICC Double Lumen 15/40/08 Right Basilic 42 cm 0 cm (Active)  Indication for Insertion or Continuance of Line Prolonged intravenous therapies 01/15/23 1150  Exposed Catheter (cm) 0 cm 01/15/23 1150  Site Assessment Clean, Dry, Intact 01/15/23 1150  Lumen #1 Status Flushed;Saline locked;Blood return noted 01/15/23 1150  Lumen #2 Status Flushed;Saline locked;Blood return noted 01/15/23 1150  Dressing Type Transparent;Securing device 01/15/23 1150  Dressing Status Antimicrobial disc in place;Clean, Dry, Intact 01/15/23 1150  Safety Lock Not Applicable 67/61/95 0932  Line Care Connections checked and tightened 01/15/23 1150  Dressing Intervention New dressing 01/15/23 1150  Dressing Change Due 01/22/23 01/15/23 [place] 01/15/2023, 12:07 PM

## 2023-01-15 NOTE — Inpatient Diabetes Management (Signed)
Inpatient Diabetes Program Recommendations  AACE/ADA: New Consensus Statement on Inpatient Glycemic Control (2015)  Target Ranges:  Prepandial:   less than 140 mg/dL      Peak postprandial:   less than 180 mg/dL (1-2 hours)      Critically ill patients:  140 - 180 mg/dL   Lab Results  Component Value Date   GLUCAP 169 (H) 01/15/2023   HGBA1C 13.7 (H) 01/13/2023    Review of Glycemic Control  Latest Reference Range & Units 01/15/23 03:02 01/15/23 04:03 01/15/23 05:04 01/15/23 07:13  Glucose-Capillary 70 - 99 mg/dL 149 (H) 156 (H) 147 (H) 169 (H)  (H): Data is abnormally high Admit with: DKA/ New Diagnosis Diabetes/ Acute Kidney Injury/ Early Pancreatitis   Home DM Meds: None Current Orders: IV Insulin Drip    Inpatient Diabetes Program Recommendations:    When ready to transition consider: -Semglee 36 units two hours prior to discontinuation then QD to follow -Novolog 0-15 units TID & HS - Novolog 4 units TID (assuming patient is consuming >50% of meals)  Will see when appropriate.   Thanks, Bronson Curb, MSN, RNC-OB Diabetes Coordinator [phone] (8a-5p)

## 2023-01-15 NOTE — Procedures (Signed)
Patient Name: Vincent Thompson  MRN: [id number]  Epilepsy Attending: Lora Havens  Referring Physician/Provider: Candee Furbish, MD  Date: 01/15/2023 Duration: 38 mins  Patient history: 57yo M with ams. EEG to evaluate for seizure  Level of alertness: lethargic   AEDs during EEG study: None  Technical aspects: This EEG was obtained using a 10 lead EEG system positioned circumferentially without any parasagittal coverage (rapid EEG). Computer selected EEG is reviewed as  well as background features and all clinically significant events.  Description: EEG showed continuous generalized 5-8hz  theta-alpha activity admixed with intermittent generalized 2-3hz  delta slowing. Hyperventilation and photic stimulation were not performed.     ABNORMALITY - Continuous slow, generalized  IMPRESSION: This limited ceribell EEG is suggestive of moderate diffuse encephalopathy, nonspecific etiology. No seizures or epileptiform discharges were seen throughout the recording.  If suspicion for interictal activity remains a concern, a conventional EEG can be considered.   Ekam Besson Barbra Sarks

## 2023-01-15 NOTE — Progress Notes (Signed)
NGT unable to be placed.  Abd a bit more soft and no belching.  He's actually more alert.  I think we can wait and have fluoro try tomorrow.  Continue present management.  Erskine Emery MD PCCM

## 2023-01-15 NOTE — Progress Notes (Signed)
[facility] Progress Note Patient Name: Vincent Thompson DOB: [birth year] MRN: [id number]   Date of Service  01/15/2023  HPI/Events of Note  Patient had a 10 beat run of wide-complex tachycardia with spontaneous termination, no recent labs.  eICU Interventions  BMP, Mg++ ordered.        Frederik Pear 01/15/2023, 8:43 PM

## 2023-01-15 NOTE — Progress Notes (Signed)
Attempted to place NGT unsuccessful, patient did vomit approx 150 ml of emesis.

## 2023-01-15 NOTE — Consult Note (Signed)
NAME:  Vincent Thompson, MRN:  [id number], DOB:  Apr 27, 1966, LOS: 3 ADMISSION DATE:  01/12/2023, CONSULTATION DATE:  01/12/23 REFERRING MD:  Lupita Leash, CHIEF COMPLAINT:  cough   History of Present Illness:  57 year old man p/w cough, lethargy, confusion.  Initial workup revealed new DKA, pancreatitis, AKI, encephalopathy. Unfortunately encephalopathy has progressed and now with an ileus.  Per wife only drinks 3-5 per week but some concern given presentation that this may be higher.  Now more resp distress and either near obtunded or trying to get out of bed.  PCCM consulted for assumption of care given deterioration.  Pertinent  Medical History  HLD HTN Prediabetes Etoh use vs. abuse  Significant Hospital Events: Including procedures, antibiotic start and stop dates in addition to other pertinent events   1/21 admit 1/24 pccm consult  Interim History / Subjective:  Consult  Objective   Blood pressure (!) 146/85, pulse (!) 102, temperature 98.1 F (36.7 C), temperature source Oral, resp. rate 18, height 5' 9.5" (1.765 m), weight 92.4 kg, SpO2 97 %.        Intake/Output Summary (Last 24 hours) at 01/15/2023 1046 Last data filed at 01/15/2023 0957 Gross per 24 hour  Intake 8626.32 ml  Output 1740 ml  Net 6886.32 ml   Filed Weights   01/12/23 2316 01/14/23 1434 01/15/23 0414  Weight: 81.5 kg 91.2 kg 92.4 kg    Examination: General: Ill appearing man with kussmaul breathing pattern HENT: MMM, trachea midline Lungs: diminished bases,+ accessory muscle use Cardiovascular: tachy, ext warm Abdomen: protuberant, tympanic to percussion, no BS Extremities: no edema Neuro: withdraws x 4, opens eyes to voice, follows commands with repeated prompting Skin: no rashes  AKI improved AST/ALT in 2:1 pattern c/w alcohol abuse CK 2k>>1200 Plts down slightly WBC elevated but improved Ammonia neg 1/22 TG normal Ferritin 2.4k  Resolved Hospital Problem list   AKI DKA  Assessment & Plan:   Acute metabolic encephalopathy- EtOH w/d vs. Hypercarbic vs. Hepatic vs. ICU delirium New DM2 ?EtOH pancreatitis Rhabdomyolysis improved but developed during stay; did he have seizure? Leukocytosis reactive improved Impending respiratory failure- questionable ability to protect airway, hopefully offloading stomach will help Hx EtOH use question abuse Question alcoholic fatty liver disease  - Place NGT, LIS - Place PICC, suspect may need TPN soon - Check ABG - Low threshold to intubate - Ceribell - Avoid BIPAP - Encourage day/night cycles - Continue endotool for now given ileus and worsening clinical status, once stable requirements can transition - CIWA driven ativan - Thiamine/folate - Recheck AM ammonia  Best Practice (right click and "Reselect all SmartList Selections" daily)   Diet/type: NPO DVT prophylaxis: LMWH GI prophylaxis: N/A Lines: N/A Foley:  N/A Code Status:  full code Last date of multidisciplinary goals of care discussion [pending]  Labs   CBC: Recent Labs  Lab 01/12/23 1423 01/12/23 1434 01/12/23 1701 01/13/23 0959 01/14/23 0259 01/15/23 0439  WBC 18.7*  --   --  19.8* 16.3* 12.8*  NEUTROABS 16.8*  --   --  17.9*  --   --   HGB 17.0 18.0* 16.0 16.2 14.0 12.6*  HCT 53.4* 53.0* 47.0 48.6 44.0 39.7  MCV 99.1  --   --  94.2 98.9 99.7  PLT 325  --   --  238 159 100*    Basic Metabolic Panel: Recent Labs  Lab 01/13/23 0028 01/13/23 0253 01/13/23 0959 01/13/23 1140 01/13/23 1354 01/13/23 2029 01/14/23 0259 01/14/23 1520 01/14/23 1551 01/15/23 0051  01/15/23 0439 01/15/23 0838  NA 151* 155*   < > 163*   < > 159* 157* 156*  --  149* 145 148*  K 4.0 3.7   < > 3.3*   < > 5.3* 2.9* 3.5  --  2.9* 5.0 4.0  CL 110 118*   < > 126*   < > 126* 128* 124*  --  119* 119* 122*  CO2 25 26   < > 27   < > 24 20* 21*  --  21* 19* 18*  GLUCOSE 726* 567*   < > 277*   < > 237* 302* 144*  --  145* 159* 185*  BUN 66* 64*   < > 52*   < > 43* 38* 25*  --  18 20  21*  CREATININE 2.27* 2.04*   < > 1.53*   < > 1.43* 1.46* 1.06  --  1.13 1.01 0.99  CALCIUM 12.4* 12.4*   < > 12.1*   < > 10.2 9.6 9.1  --  8.1* 8.0* 8.1*  MG 3.7* 3.5*  --   --   --   --  1.9  --   --   --   --   --   PHOS 3.0 1.9*  --  1.1*  --  5.9* 1.2*  --  2.9  --  2.7  --    < > = values in this interval not displayed.   GFR: Estimated Creatinine Clearance: 93.3 mL/min (by C-G formula based on SCr of 0.99 mg/dL). Recent Labs  Lab 01/12/23 1423 01/12/23 1602 01/12/23 1932 01/13/23 0003 01/13/23 0031 01/13/23 0959 01/14/23 0259 01/15/23 0439  PROCALCITON  --   --   --   --  3.79  --   --   --   WBC 18.7*  --   --   --   --  19.8* 16.3* 12.8*  LATICACIDVEN 5.9* 6.8* 2.5* 2.3*  --  1.6  --   --     Liver Function Tests: Recent Labs  Lab 01/12/23 1423 01/14/23 0259 01/15/23 0439  AST 66* 89* 74*  ALT 96* 52* 42  ALKPHOS 76 47 45  BILITOT 1.8* 0.6 1.0  PROT 9.7* 6.3* 6.1*  ALBUMIN 4.6 2.9* 2.6*   Recent Labs  Lab 01/12/23 1600 01/13/23 0031 01/13/23 0253 01/15/23 0439  LIPASE 1,194* 166* 1,012* 147*   Recent Labs  Lab 01/13/23 0003  AMMONIA 33    ABG    Component Value Date/Time   PHART 7.303 (L) 01/12/2023 1701   PCO2ART 32.8 01/12/2023 1701   PO2ART 70 (L) 01/12/2023 1701   HCO3 16.2 (L) 01/12/2023 1701   TCO2 17 (L) 01/12/2023 1701   ACIDBASEDEF 9.0 (H) 01/12/2023 1701   O2SAT 92 01/12/2023 1701     Coagulation Profile: Recent Labs  Lab 01/12/23 1423  INR 1.3*    Cardiac Enzymes: Recent Labs  Lab 01/13/23 0031 01/14/23 0259 01/15/23 0439  CKTOTAL 439* 2,005* 1,269*    HbA1C: Hgb A1c MFr Bld  Date/Time Value Ref Range Status  01/13/2023 12:28 AM 13.7 (H) 4.8 - 5.6 % Final    Comment:    (NOTE)         Prediabetes: 5.7 - 6.4         Diabetes: >6.4         Glycemic control for adults with diabetes: <7.0     CBG: Recent Labs  Lab 01/15/23 0302 01/15/23 0403 01/15/23 0504 01/15/23 0713 01/15/23  0930  GLUCAP 149* 156*  147* 169* 197*    Review of Systems:   Too encephalopathic to answer  Past Medical History:  He,  has a past medical history of Hyperlipemia and Hypertension.   Surgical History:  History reviewed. No pertinent surgical history.   Social History:   reports that he has never smoked. He has never used smokeless tobacco. He reports that he does not drink alcohol and does not use drugs.   Family History:  His family history is not on file.   Allergies No Known Allergies   Home Medications  Prior to Admission medications   Medication Sig Start Date End Date Taking? Authorizing Provider  albuterol (VENTOLIN HFA) 108 (90 Base) MCG/ACT inhaler Inhale 2 puffs into the lungs every 4 (four) hours as needed for wheezing or shortness of breath. 10/11/21  Yes [provider]  amLODipine-benazepril (LOTREL) 10-40 MG capsule Take 1 capsule by mouth daily.   Yes [provider]  escitalopram (LEXAPRO) 20 MG tablet Take 1 tablet by mouth daily. 09/09/22  Yes [provider]  Glucosamine 500 MG CAPS Take 1 tablet by mouth daily.   Yes [provider]  hydrochlorothiazide (HYDRODIURIL) 25 MG tablet Take 25 mg by mouth daily.   Yes [provider]  simvastatin (ZOCOR) 20 MG tablet Take 20 mg by mouth every morning.   Yes [provider]  vitamin B-12 (CYANOCOBALAMIN) 100 MCG tablet Take 100 mcg by mouth.   Yes [provider]     Critical care time: 34 mins independent of procedures

## 2023-01-15 NOTE — Progress Notes (Signed)
[facility] Progress Note Patient Name: Vincent Thompson DOB: 1966-02-13 MRN: [id number]   Date of Service  01/15/2023  HPI/Events of Note  K+ 3.2, Mg++ 1.5, Cr 0.99.  eICU Interventions  Electrolytes replaced per E-Link electrolyte replacement protocol.        Frederik Pear 01/15/2023, 11:32 PM

## 2023-01-15 NOTE — Progress Notes (Signed)
[facility] Progress Note  SUBJECTIVE:   Interval history: Vincent Thompson was seen and evaluated today at bedside.  He was resting comfortably in bed, no family at bedside.  Appears that patient recently had NG tube placement attempts, some fresh blood noted near his nares.  He noted having abdominal distention and that he has not had a bowel movement recently.  He denied any nausea or vomiting.  Denied any chest pain and shortness of breath.  He was awake and alert, oriented to person and place though not oriented to time.  He noted that he consumed roughly 3 alcoholic drinks per day in the recent past, when asked what 1 drink consisted of he answered "a beer and a shot".  Interval events of abdominal x-Thompson showing possible ileus as well as EEG findings were reviewed.  Past Medical History:  Diagnosis Date   Hyperlipemia    Hypertension    History reviewed. No pertinent surgical history. Current Facility-Administered Medications  Medication Dose Route Frequency Provider Last Rate Last Admin   0.9 %  sodium chloride infusion   Intravenous Continuous Vincent Jaffe, MD 75 mL/hr at 01/15/23 1653 Infusion Verify at 01/15/23 1653   Chlorhexidine Gluconate Cloth 2 % PADS 6 each  6 each Topical Daily Vincent Baker, MD   6 each at 01/15/23 1524   dextrose 5 % solution   Intravenous Continuous Vincent Furbish, MD       dextrose 50 % solution 0-50 mL  0-50 mL Intravenous PRN Vincent Baker, MD       enoxaparin (LOVENOX) injection 40 mg  40 mg Subcutaneous Q24H Vincent Furbish, MD   40 mg at 56/43/32 9518   folic acid (FOLVITE) tablet 1 mg  1 mg Oral Daily Vincent Thompson, Ramesh, MD   1 mg at 01/13/23 1058   insulin regular, human (MYXREDLIN) 100 units/ 100 mL infusion   Intravenous Continuous Doutova, Anastassia, MD 5 mL/hr at 01/15/23 1653 5 Units/hr at 01/15/23 1653   LORazepam (ATIVAN) tablet 1-4 mg  1-4 mg Oral Q1H PRN Vincent Pert, MD       Or   LORazepam (ATIVAN) injection 1-4 mg   1-4 mg Intravenous Q1H PRN Vincent Thompson, Maren Beach, MD   2 mg at 01/15/23 0010   multivitamin with minerals tablet 1 tablet  1 tablet Oral Daily Vincent Thompson, Ramesh, MD       Oral care mouth rinse  15 mL Mouth Rinse PRN Doutova, Anastassia, MD       sodium chloride flush (NS) 0.9 % injection 10-40 mL  10-40 mL Intracatheter Q12H Vincent Furbish, MD   10 mL at 01/15/23 1331   sodium chloride flush (NS) 0.9 % injection 10-40 mL  10-40 mL Intracatheter PRN Vincent Furbish, MD       thiamine (VITAMIN B1) 400 mg in sodium chloride 0.9 % 50 mL IVPB  400 mg Intravenous Q8H Vincent Thompson, Maren Beach, MD   Stopped at 01/15/23 1630   Followed by   Vincent Thompson ON 01/18/2023] thiamine (VITAMIN B1) injection 100 mg  100 mg Intravenous Q8H Vincent Thompson, Maren Beach, MD       Allergies as of 01/12/2023   (No Known Allergies)   Review of Systems:  Review of Systems  Respiratory:  Negative for shortness of breath.   Cardiovascular:  Negative for chest pain.  Gastrointestinal:  Negative for abdominal pain, nausea and vomiting.    OBJECTIVE:   Temp:  [97.9 F (36.6 C)-100 F (37.8 C)] 98.1 F (36.7 C) (01/24 1512)  Pulse Rate:  [62-108] 62 (01/24 1600) Resp:  [18-36] 25 (01/24 1600) BP: (117-157)/(85-105) 117/93 (01/24 1600) SpO2:  [95 %-100 %] 95 % (01/24 1600) Weight:  [92.4 kg] 92.4 kg (01/24 0414) Last BM Date :  (not known - PTA) Physical Exam Constitutional:      General: He is not in acute distress.    Appearance: He is not ill-appearing, toxic-appearing or diaphoretic.  Cardiovascular:     Rate and Rhythm: Regular rhythm. Tachycardia present.  Pulmonary:     Effort: No respiratory distress.     Breath sounds: Normal breath sounds.     Comments: No supplemental oxygen in place at time of exam. Abdominal:     General: Bowel sounds are normal. There is distension.     Palpations: Abdomen is soft.     Tenderness: There is no abdominal tenderness. There is no guarding.  Musculoskeletal:     Right lower leg: No edema.     Left lower leg: No  edema.  Skin:    General: Skin is warm and dry.  Neurological:     Mental Status: He is alert.     Comments: Oriented to person and place, not oriented to time.  Has some nonsensical speech.     Labs: Recent Labs    01/13/23 0959 01/14/23 0259 01/15/23 0439  WBC 19.8* 16.3* 12.8*  HGB 16.2 14.0 12.6*  HCT 48.6 44.0 39.7  PLT 238 159 100*   BMET Recent Labs    01/15/23 0051 01/15/23 0439 01/15/23 0838  NA 149* 145 148*  K 2.9* 5.0 4.0  CL 119* 119* 122*  CO2 21* 19* 18*  GLUCOSE 145* 159* 185*  BUN 18 20 21*  CREATININE 1.13 1.01 0.99  CALCIUM 8.1* 8.0* 8.1*   LFT Recent Labs    01/14/23 0259 01/15/23 0439  PROT 6.3* 6.1*  ALBUMIN 2.9* 2.6*  AST 89* 74*  ALT 52* 42  ALKPHOS 47 45  BILITOT 0.6 1.0  BILIDIR 0.1  --   IBILI 0.5  --    PT/INR No results for input(s): "LABPROT", "INR" in the last 72 hours. Diagnostic imaging: Rapid EEG  Result Date: 01/15/2023 Vincent Havens, MD     01/15/2023  3:11 PM Patient Name: Vincent Thompson MRN: [id number] Epilepsy Attending: Lora Thompson Referring Physician/Provider: Candee Furbish, MD Date: 01/15/2023 Duration: 38 mins Patient history: 57yo M with ams. EEG to evaluate for seizure Level of alertness: lethargic AEDs during EEG study: None Technical aspects: This EEG was obtained using a 10 lead EEG system positioned circumferentially without any parasagittal coverage (rapid EEG). Computer selected EEG is reviewed as  well as background features and all clinically significant events. Description: EEG showed continuous generalized 5-8hz  theta-alpha activity admixed with intermittent generalized 2-3hz  delta slowing. Hyperventilation and photic stimulation were not performed.   ABNORMALITY - Continuous slow, generalized IMPRESSION: This limited ceribell EEG is suggestive of moderate diffuse encephalopathy, nonspecific etiology. No seizures or epileptiform discharges were seen throughout the recording. If suspicion for  interictal activity remains a concern, a conventional EEG can be considered. Vincent Thompson   [place] EKG SITE RITE  Result Date: 01/15/2023 If Vincent Thompson not attached, placement could not be confirmed due to current cardiac rhythm.  DG Abd 1 View  Result Date: 01/15/2023 CLINICAL DATA:  57 year old male with abdominal distension. EXAM: ABDOMEN - 1 VIEW COMPARISON:  CT Abdomen and Pelvis 01/12/2023. FINDINGS: Portable AP supine view at 0847 hours. Gas-filled but nondilated  large and small bowel throughout the abdomen and pelvis. Gas in the rectum. Bowel gas has significantly increased from the recent CT. No pneumoperitoneum is evident on this supine view. Other abdominal and pelvic visceral contours appear negative. No acute osseous abnormality identified. IMPRESSION: Gas now throughout nondilated small and large bowel to the rectum. Pattern compatible with Ileus. Electronically Signed   By: Odessa Fleming M.D.   On: 01/15/2023 08:53   DG CHEST PORT 1 VIEW  Result Date: 01/15/2023 CLINICAL DATA:  Tachypnea.  DKA and AK I. EXAM: PORTABLE CHEST 1 VIEW COMPARISON:  CT chest and chest x-Thompson dated January 12, 2023. FINDINGS: The heart size and mediastinal contours are within normal limits. Continued low lung volumes. No focal consolidation, pleural effusion, or pneumothorax. No acute osseous abnormality. IMPRESSION: No active disease. Electronically Signed   By: Obie Dredge M.D.   On: 01/15/2023 08:36    IMPRESSION: Acute pancreatitis, presumed secondary to alcohol use  -Appears alcohol use may be roughly 6 drinks per day prior to admission  -CT scan of abdomen pelvis on 01/12/2023 showed hyperdense gallbladder otherwise unremarkable  -Patient with no significant abdominal pain on palpation today Ileus, abdominal distention, new Diabetic ketoacidosis, since resolved Metabolic encephalopathy Hypernatremia Normocytic anemia  PLAN: -Critical care team evaluation noted -Continue current supportive  measures for acute pancreatitis including IV fluids, he is n.p.o. given new finding of ileus and pending NG tube placement, recommend judicious use of narcotic pain medications given current encephalopathy -Will check abdominal ultrasound for completeness to rule out biliary cause of pancreatitis -Monitor bowel movements -CIWA protocol -[facility] will follow   LOS: 3 days   Liliane Shi, [facility]

## 2023-01-15 NOTE — Progress Notes (Signed)
PROGRESS NOTE Vincent Thompson  NUU:[id number] DOB: 1966/12/12 DOA: 01/12/2023 PCP: [name], Michael, MD   Brief Narrative/Hospital Course: 57 year old male with hypertension elevated LFTs anxiety hyperlipidemia who has been having cough and upper respiratory symptoms for 2 and half weeks-had tested negative for COVID a week ago, presented with lethargy/confusion, tired. In the ED low-grade fever 100.2 tachycardic in the 120s, stable BP and oxygen saturation Labs obtained that showed blood sugar more than 1200, hyperkalemia, AKI w/ creatinine 3.6, transaminitis lactic acidosis 5.9 leukocytosis 18.7 BHB more than 8 COVID-19 influenza negative, lipase elevated 1194,CK4 39 troponin 44.  Normal folate and elevated B12, iron 39 but ferritin high at 2484. Imaging: Chest x-ray low lung volumes, CT chest abdomen abdomen pelvis w/o: Mild stranding/edema of pancreas concerning for pancreatitis, likely reactive/secondary duodenitis, hepatic steatosis, 2.6 cm left renal lesion. RVP panel pending Patient was aggressively volume resuscitated with 2 L bolus, was given vancomycin and Zosyn, placed on IV insulin drip, cefepime Flagyl and admitted for further management Critical care Dr Merrily Pewhand was consulted but [facility] Due to severe electrolyte imbalance nephrology was consulted   Subjective: Seen and examined this morning Nursing reports he needed Posey as he was trying to get out of the bed Remains confused able to tell me his name and that he is in the hospital Denies other complaints.  He is on room air Oral mucosa appears dry Overnight afebrile labs reviewed electrolytes have improved substantially  Assessment and Plan: Principal Problem:   DKA (diabetic ketoacidosis) (HCC) Active Problems:   AKI (acute kidney injury) (HCC)   Pancreatitis   Hypercalcemia   Elevated LFTs   SIRS (systemic inflammatory response syndrome) (HCC)   Acute metabolic encephalopathy  New onset  diabetes DKA History of prediabetes: Blood sugar more than 1200 with anion gap 33  in ED.DKA isresolved with aggressive volume resuscitation and insulin drip.  Blood sugar in mid 100 beta-hydroxybutyrate 0.1( normal). He remains n.p.o. given his encephalopathy and pancreatitis but now w/ Ileus>continue insulin drip for now Recent Labs  Lab 01/13/23 0028 01/13/23 0036 01/15/23 0202 01/15/23 0302 01/15/23 0403 01/15/23 0504 01/15/23 0713  GLUCAP  --    < > 130* 149* 156* 147* 169*  HGBA1C 13.7*  --   --   --   --   --   --    < > = values in this interval not displayed.   Pancreatitis lipase 166>1012 Abdominal distention/Ileus: CT chest abdomen pelvis showed pancreatitis, does have history of alcohol abuse, likely the etiology. TG level normal.  Patient abdomen appears distended> obtain x-ray abdomen and await GI recommendations today. Continue on aggressive fluid resuscitation.  Lipase down to 147 ALT normalized AST 74.Ferritin high in 2400  XRAY abd shows ordered this am- showing ileus  History of alcohol use 3-5 drinks per week Alcoholic hepatitis/abnormal LFTs: At risk of withdrawal continue with CIWA scale Ativan, thiamine folate/multivitamin.  Ethanol level negative drug screen negative. He drinks regularly- ~2 drinks at least every other day last drink about a week ago as per family.  Acute hepatitis panels were negative  Acute metabolic encephalopathy in the setting of patient's DKA/SIRS possible alcohol withdrawal: He remains confused.  Will put him on high-dose thiamine.Continue Ativan as needed per scale, continue supportive care, delirium precaution fall precaution.  If remains persistently agitated needing poseyhe may need Precedex drip in which case we will consult PCCM: asked Dr Katrinka BlazingSmith to see the patient.  AKI: In the setting of volume depletion  with DKA> resolved. nephrology following renal ultrasound no hydronephrosis borderline mild renal enlargement Recent Labs     01/13/23 0028 01/13/23 0253 01/13/23 0959 01/13/23 1140 01/13/23 1354 01/13/23 2029 01/14/23 0259 01/14/23 1520 01/15/23 0051 01/15/23 0439  BUN 66* 64* 55* 52* 51* 43* 38* 25* 18 20  CREATININE 2.27* 2.04* 1.60* 1.53* 1.46* 1.43* 1.46* 1.06 1.13 1.01    Severe dehydration with DKA with free water deficit Hypernatremia: Patient's sodium was in normal range which is false in the setting of hyperglycemia with blood sugar more than 1200.  Needed significant volume resuscitation with normal saline along with free water D5W.  Fluid being adjusted per nephrology hopefully can cut down further but he still appears dry. Recent Labs  Lab 01/13/23 2029 01/14/23 0259 01/14/23 1520 01/15/23 0051 01/15/23 0439  NA 159* 157* 156* 149* 145      Hypophosphatemia:In the setting of DKA/insulin use: Replaced and normalized Hypercalcemia-in the setting of severe dehydration: PTH appropriately suppressed, calcium has normalized.  Hyperkalemia-mild Hypokalemia: replaced  and now on higher side Recent Labs  Lab 01/13/23 0028 01/13/23 0253 01/13/23 0959 01/13/23 1140 01/13/23 1354 01/13/23 2029 01/14/23 0259 01/14/23 1520 01/14/23 1551 01/15/23 0051 01/15/23 0439  K 4.0 3.7   < > 3.3*   < > 5.3* 2.9* 3.5  --  2.9* 5.0  CALCIUM 12.4* 12.4*   < > 12.1*   < > 10.2 9.6 9.1  --  8.1* 8.0*  MG 3.7* 3.5*  --   --   --   --  1.9  --   --   --   --   PHOS 3.0 1.9*  --  1.1*  --  5.9* 1.2*  --  2.9  --  2.7   < > = values in this interval not displayed.   Vitamin D Deficiency at 19.5-pending calcitriol level- start high dose repalcement once taking po.   Rhabdomyolysis:CK improving-cont aggressive fluid,monitor CK level as below  Recent Labs  Lab 01/13/23 0031 01/14/23 0259 01/15/23 0439  CKTOTAL 439* 2,005* 1,269*   Elevated troponin: Patient without chest pain likely demand ischemia in the setting of severe electrolyte imbalance.  Trops flat  SIRS Leucocytosis Lactic acidosis: +  procal 3.7>Chest x-ray CT chest on pelvis no obvious infection noted UA WBC 0-5.Suspect this is due to patient's dehydration/dka/pancreatitis> blood culture negative so far> will stop antibiotics today. Recent Labs  Lab 01/12/23 1423 01/12/23 1602 01/12/23 1932 01/13/23 0003 01/13/23 0031 01/13/23 0959 01/14/23 0259 01/15/23 0439  WBC 18.7*  --   --   --   --  19.8* 16.3* 12.8*  LATICACIDVEN 5.9* 6.8* 2.5* 2.3*  --  1.6  --   --   PROCALCITON  --   --   --   --  3.79  --   --   --    DVT prophylaxis: SCDs Start: 01/12/23 2346 Code Status:   Code Status: Full Code Family Communication: plan of care discussed with patient/no family at bedside I had updated patient's wife at the bedside previously.    I called and updated his wife 1/24> who verbalized understanding of his overall clinical situation and that he is critically ill  Patient status is: Inpatient because of DKA/severe electrolyte derangement and encephalopathy Level of care: Stepdown  Dispo: The patient is from: home            Anticipated disposition: TBD Objective: Vitals last 24 hrs: Vitals:   01/15/23 0414 01/15/23 0600 01/15/23 0630 01/15/23 0731  BP:  (!) 148/98 (!) 140/105   Pulse:  99 100   Resp:   19   Temp:   98.1 F (36.7 C) 98.1 F (36.7 C)  TempSrc:   Oral Oral  SpO2:   97%   Weight: 92.4 kg     Height:       Weight change:   Physical Examination: General exam: Drowsy able to wake up and tell me his name and that he is in the hospital, on room air.   HEENT:Oral mucosa DRY, Ear/Nose WNL grossly, dentition normal. Respiratory system: bilaterally clear BS, no use of accessory muscle Cardiovascular system: S1 & S2 +, regular rate, JVD neg. Gastrointestinal system: Abdomen soft, minimal tenderness but significantly distended, bowel sound present  Nervous System:Alert, awake, moving extremities and grossly nonfocal Extremities: LE ankle edema neg, lower extremities warm Skin: No rashes,no  icterus. MSK: Normal muscle bulk,tone, power   Medications reviewed:  Scheduled Meds:  Chlorhexidine Gluconate Cloth  6 each Topical Daily   folic acid  1 mg Oral Daily   multivitamin with minerals  1 tablet Oral Daily   thiamine  100 mg Oral Daily   Or   thiamine  100 mg Intravenous Daily   Continuous Infusions:  sodium chloride 75 mL/hr at 01/15/23 0808   ceFEPime (MAXIPIME) IV Stopped (01/15/23 0140)   dextrose 75 mL/hr at 01/15/23 0808   insulin 5.5 Units/hr (01/15/23 0808)   metronidazole Stopped (01/15/23 0140)   potassium chloride 100 mL/hr at 01/15/23 0808    Diet Order             Diet NPO time specified  Diet effective now                   Intake/Output Summary (Last 24 hours) at 01/15/2023 0824 Last data filed at 01/15/2023 4403 Gross per 24 hour  Intake 8197.94 ml  Output 2190 ml  Net 6007.94 ml   Net IO Since Admission: 10,282.27 mL [01/15/23 0824]  Wt Readings from Last 3 Encounters:  01/15/23 92.4 kg  07/31/20 88.5 kg  09/27/19 88.5 kg     Unresulted Labs (From admission, onward)     Start     Ordered   01/15/23 0500  CBC  Daily,   R     Question:  Specimen collection method  Answer:  Lab=Lab collect   01/14/23 0747   01/15/23 0500  Comprehensive metabolic panel  Daily,   R     Question:  Specimen collection method  Answer:  Lab=Lab collect   01/14/23 0853   01/15/23 0500  CK  Daily,   R     Question:  Specimen collection method  Answer:  Lab=Lab collect   01/14/23 1245   01/15/23 0000  Basic metabolic panel  Now then every 4 hours,   R     Question:  Specimen collection method  Answer:  Lab=Lab collect   01/14/23 2009   01/13/23 1703  Calcitriol (1,25 di-OH Vit D)  Once,   R       Question:  Specimen collection method  Answer:  Lab=Lab collect   01/13/23 1702   01/13/23 1702  PTH-related peptide  Once,   R       Question:  Specimen collection method  Answer:  Lab=Lab collect   01/13/23 1702   01/13/23 0500  CBC with Differential   Tomorrow morning,   R        01/12/23 2343  Data Reviewed: I have personally reviewed following labs and imaging studies CBC: Recent Labs  Lab 01/12/23 1423 01/12/23 1434 01/12/23 1701 01/13/23 0959 01/14/23 0259 01/15/23 0439  WBC 18.7*  --   --  19.8* 16.3* 12.8*  NEUTROABS 16.8*  --   --  17.9*  --   --   HGB 17.0 18.0* 16.0 16.2 14.0 12.6*  HCT 53.4* 53.0* 47.0 48.6 44.0 39.7  MCV 99.1  --   --  94.2 98.9 99.7  PLT 325  --   --  238 159 694*   Basic Metabolic Panel: Recent Labs  Lab 01/13/23 0028 01/13/23 0253 01/13/23 0959 01/13/23 1140 01/13/23 1354 01/13/23 2029 01/14/23 0259 01/14/23 1520 01/14/23 1551 01/15/23 0051 01/15/23 0439  NA 151* 155*   < > 163*   < > 159* 157* 156*  --  149* 145  K 4.0 3.7   < > 3.3*   < > 5.3* 2.9* 3.5  --  2.9* 5.0  CL 110 118*   < > 126*   < > 126* 128* 124*  --  119* 119*  CO2 25 26   < > 27   < > 24 20* 21*  --  21* 19*  GLUCOSE 726* 567*   < > 277*   < > 237* 302* 144*  --  145* 159*  BUN 66* 64*   < > 52*   < > 43* 38* 25*  --  18 20  CREATININE 2.27* 2.04*   < > 1.53*   < > 1.43* 1.46* 1.06  --  1.13 1.01  CALCIUM 12.4* 12.4*   < > 12.1*   < > 10.2 9.6 9.1  --  8.1* 8.0*  MG 3.7* 3.5*  --   --   --   --  1.9  --   --   --   --   PHOS 3.0 1.9*  --  1.1*  --  5.9* 1.2*  --  2.9  --  2.7   < > = values in this interval not displayed.   GFR: Estimated Creatinine Clearance: 91.4 mL/min (by C-G formula based on SCr of 1.01 mg/dL). Liver Function Tests: Recent Labs  Lab 01/12/23 1423 01/14/23 0259 01/15/23 0439  AST 66* 89* 74*  ALT 96* 52* 42  ALKPHOS 76 47 45  BILITOT 1.8* 0.6 1.0  PROT 9.7* 6.3* 6.1*  ALBUMIN 4.6 2.9* 2.6*   Recent Labs  Lab 01/12/23 1600 01/13/23 0031 01/13/23 0253 01/15/23 0439  LIPASE 1,194* 166* 1,012* 147*   Recent Labs  Lab 01/13/23 0003  AMMONIA 33   Coagulation Profile: Recent Labs  Lab 01/12/23 1423  INR 1.3*   Recent Labs    01/13/23 0003  TSH 0.357    Sepsis Labs: Recent Labs  Lab 01/12/23 1602 01/12/23 1932 01/13/23 0003 01/13/23 0031 01/13/23 0959  PROCALCITON  --   --   --  3.79  --   LATICACIDVEN 6.8* 2.5* 2.3*  --  1.6    Recent Results (from the past 240 hour(s))  Blood Culture (routine x 2)     Status: None (Preliminary result)   Collection Time: 01/12/23  2:23 PM   Specimen: Left Antecubital; Blood  Result Value Ref Range Status   Specimen Description   Final    LEFT ANTECUBITAL BLOOD Performed at [facility], [address] [place],  [address]    Special Requests   Final    Blood Culture adequate  volume BOTTLES DRAWN AEROBIC AND ANAEROBIC Performed at [facility] [place], [address] [place], [place] [address]    Culture   Final    NO GROWTH 3 DAYS Performed at [facility], [address] [place], [place] [address]    Report Status PENDING  Incomplete  Resp panel by RT-PCR (RSV, Flu A&B, Covid) Anterior Nasal Swab     Status: None   Collection Time: 01/12/23  2:30 PM   Specimen: Anterior Nasal Swab  Result Value Ref Range Status   SARS Coronavirus 2 by RT PCR NEGATIVE NEGATIVE Final    Comment: (NOTE) SARS-CoV-2 target nucleic acids are NOT DETECTED.  The SARS-CoV-2 RNA is generally detectable in upper respiratory specimens during the acute phase of infection. The lowest concentration of SARS-CoV-2 viral copies this assay can detect is 138 copies/mL. A negative result does not preclude SARS-Cov-2 infection and should not be used as the sole basis for treatment or other patient management decisions. A negative result may occur with  improper specimen collection/handling, submission of specimen other than nasopharyngeal swab, presence of viral mutation(s) within the areas targeted by this assay, and inadequate number of viral copies(<138 copies/mL). A negative result must be combined with clinical observations, patient history, and epidemiological information. The  expected result is Negative.  Fact Sheet for Patients:  BloggerCourse.comhttps://www.fda.gov/media/152166/download  Fact Sheet for Healthcare Providers:  SeriousBroker.ithttps://www.fda.gov/media/152162/download  This test is no t yet approved or cleared by the [place] FDA and  has been authorized for detection and/or diagnosis of SARS-CoV-2 by FDA under an Emergency Use Authorization (EUA). This EUA will remain  in effect (meaning this test can be used) for the duration of the COVID-19 declaration under Section 564(b)(1) of the Act, 21 U.S.C.section 360bbb-3(b)(1), unless the authorization is terminated  or revoked sooner.       Influenza A by PCR NEGATIVE NEGATIVE Final   Influenza B by PCR NEGATIVE NEGATIVE Final    Comment: (NOTE) The Xpert Xpress SARS-CoV-2/FLU/RSV plus assay is intended as an aid in the diagnosis of influenza from Nasopharyngeal swab specimens and should not be used as a sole basis for treatment. Nasal washings and aspirates are unacceptable for Xpert Xpress SARS-CoV-2/FLU/RSV testing.  Fact Sheet for Patients: BloggerCourse.comhttps://www.fda.gov/media/152166/download  Fact Sheet for Healthcare Providers: SeriousBroker.ithttps://www.fda.gov/media/152162/download  This test is not yet approved or cleared by the [place] FDA and has been authorized for detection and/or diagnosis of SARS-CoV-2 by FDA under an Emergency Use Authorization (EUA). This EUA will remain in effect (meaning this test can be used) for the duration of the COVID-19 declaration under Section 564(b)(1) of the Act, 21 U.S.C. section 360bbb-3(b)(1), unless the authorization is terminated or revoked.     Resp Syncytial Virus by PCR NEGATIVE NEGATIVE Final    Comment: (NOTE) Fact Sheet for Patients: BloggerCourse.comhttps://www.fda.gov/media/152166/download  Fact Sheet for Healthcare Providers: SeriousBroker.ithttps://www.fda.gov/media/152162/download  This test is not yet approved or cleared by the [place] FDA and has been authorized for detection and/or  diagnosis of SARS-CoV-2 by FDA under an Emergency Use Authorization (EUA). This EUA will remain in effect (meaning this test can be used) for the duration of the COVID-19 declaration under Section 564(b)(1) of the Act, 21 U.S.C. section 360bbb-3(b)(1), unless the authorization is terminated or revoked.  Performed at [facility] [place], [address] [place], [place] [address]   Blood Culture (routine x 2)     Status: None (Preliminary result)   Collection Time: 01/12/23  2:37 PM  Specimen: BLOOD RIGHT HAND  Result Value Ref Range Status   Specimen Description   Final    BLOOD RIGHT HAND BLOOD Performed at [facility], [address] [place], [place] [address]    Special Requests   Final    Blood Culture adequate volume BOTTLES DRAWN AEROBIC AND ANAEROBIC Performed at [facility], [address] [place], [place] [address]    Culture   Final    NO GROWTH 3 DAYS Performed at [facility], [place] [address], [place], [place] [address]    Report Status PENDING  Incomplete  Urine Culture     Status: None   Collection Time: 01/12/23  4:43 PM   Specimen: In/Out Cath Urine  Result Value Ref Range Status   Specimen Description   Final    IN/OUT CATH URINE Performed at [facility], [address] [place], [place] [address]    Special Requests   Final    NONE Performed at [facility], [address] [place], [place] [address]    Culture   Final    NO GROWTH Performed at [facility], [place] [address] [place], [place] [address]    Report Status 01/13/2023 FINAL  Final  MRSA Next Gen by PCR, Nasal     Status: None   Collection Time: 01/12/23 11:06 PM  Result Value Ref Range Status   MRSA by PCR Next Gen NOT DETECTED NOT DETECTED Final    Comment: (NOTE) The GeneXpert MRSA Assay (FDA approved for NASAL specimens only), is one component of a comprehensive MRSA colonization surveillance program. It is not intended  to diagnose MRSA infection nor to guide or monitor treatment for MRSA infections. Test performance is not FDA approved in patients less than 33 years old. Performed at [facility], [place] [address] [place], [place] [address]   Respiratory (~20 pathogens) panel by PCR     Status: None   Collection Time: 01/12/23 11:52 PM   Specimen: Nasopharyngeal Swab; Respiratory  Result Value Ref Range Status   Adenovirus NOT DETECTED NOT DETECTED Final   Coronavirus 229E NOT DETECTED NOT DETECTED Final    Comment: (NOTE) The Coronavirus on the Respiratory Panel, DOES NOT test for the novel  Coronavirus (2019 nCoV)    Coronavirus HKU1 NOT DETECTED NOT DETECTED Final   Coronavirus NL63 NOT DETECTED NOT DETECTED Final   Coronavirus OC43 NOT DETECTED NOT DETECTED Final   Metapneumovirus NOT DETECTED NOT DETECTED Final   Rhinovirus / Enterovirus NOT DETECTED NOT DETECTED Final   Influenza A NOT DETECTED NOT DETECTED Final   Influenza B NOT DETECTED NOT DETECTED Final   Parainfluenza Virus 1 NOT DETECTED NOT DETECTED Final   Parainfluenza Virus 2 NOT DETECTED NOT DETECTED Final   Parainfluenza Virus 3 NOT DETECTED NOT DETECTED Final   Parainfluenza Virus 4 NOT DETECTED NOT DETECTED Final   Respiratory Syncytial Virus NOT DETECTED NOT DETECTED Final   Bordetella pertussis NOT DETECTED NOT DETECTED Final   Bordetella Parapertussis NOT DETECTED NOT DETECTED Final   Chlamydophila pneumoniae NOT DETECTED NOT DETECTED Final   Mycoplasma pneumoniae NOT DETECTED NOT DETECTED Final    Comment: Performed at [facility], [place]. [address], [place], [place] [address]    Antimicrobials: Anti-infectives (From admission, onward)    Start     Dose/Rate Route Frequency Ordered Stop   01/13/23 1800  vancomycin (VANCOREADY) IVPB 1500 mg/300 mL  Status:  Discontinued        1,500 mg 150 mL/hr over 120 Minutes Intravenous Every 24 hours 01/13/23 1404 01/15/23 0817   01/13/23 0100  ceFEPIme  (MAXIPIME) 2 g in sodium chloride 0.9 % 100 mL IVPB        2 g 200 mL/hr over 30 Minutes Intravenous Every 12 hours 01/13/23 0002     01/13/23 0030  metroNIDAZOLE (FLAGYL) IVPB 500 mg        500 mg 100 mL/hr over 60 Minutes Intravenous Every 12 hours 01/12/23 2343 01/20/23 0029   01/12/23 1740  vancomycin variable dose per unstable renal function (pharmacist dosing)  Status:  Discontinued         Does not apply See admin instructions 01/12/23 1740 01/13/23 1406   01/12/23 1630  vancomycin (VANCOCIN) 500 mg in sodium chloride 0.9 % 100 mL IVPB       See Hyperspace for full Linked Orders Report.   500 mg 100 mL/hr over 60 Minutes Intravenous  Once 01/12/23 1523 01/12/23 1818   01/12/23 1530  vancomycin (VANCOCIN) IVPB 1000 mg/200 mL premix       See Hyperspace for full Linked Orders Report.   1,000 mg 200 mL/hr over 60 Minutes Intravenous  Once 01/12/23 1523 01/12/23 1652   01/12/23 1500  piperacillin-tazobactam (ZOSYN) IVPB 3.375 g        3.375 g 100 mL/hr over 30 Minutes Intravenous  Once 01/12/23 1450 01/12/23 1530      Culture/Microbiology    Component Value Date/Time   SDES  01/12/2023 1643    IN/OUT CATH URINE Performed at [facility], [address] [place], [place] [address]    [facility]  01/12/2023 1643    NONE Performed at [facility], [address] [place], [place] [address]    CULT  01/12/2023 1643    NO GROWTH Performed at [facility], [address], [place], [place] [address]    REPTSTATUS 01/13/2023 FINAL 01/12/2023 1643    Other culture-see note  Radiology Studies: No results found.   LOS: 3 days   Lanae Boast, MD [facility]  01/15/2023, 8:24 AM

## 2023-01-15 NOTE — Progress Notes (Signed)
[facility] Progress Note  Subjective: pt seen in ICU, good UOP 2200 cc yesterday. Net I/O is now 3.8 L.   Vitals:   01/15/23 0800 01/15/23 0900 01/15/23 1000 01/15/23 1100  BP:   (!) 131/103 (!) 135/102  Pulse: (!) 104 (!) 102 (!) 101 (!) 106  Resp: (!) 31 18 (!) 35 19  Temp:      TempSrc:      SpO2: 97% 97% 98% 97%  Weight:      Height:        Exam: Gen lethargic, hands in mittens, on RA BP's stable 160/90 range Sclera anicteric, throat clear  No jvd or bruits, flat neck veins Chest clear bilat to bases RRR no MRG Abd soft ntnd no mass or ascites +bs Ext no LE or UE edema Neuro as above          Home meds include - amlodipine-benazepril, lexapro, simvastatin, vits/ supps/ prns          UA >500 bs, neg protein, 0-5 wbc/ rbc, many bact    I/O 2.8 L in and 3.6 L out, net neg 862 cc        US renal - 10.9/ 12.3 cm kidneys w/o hydro    CXR - no active disease   Assessment/ Plan: Hypernatremia - Na+ 164 in setting of severe new onset DKA. On exam pt is still quite vol depleted overall so this is c/w hypovolemic hyponatremia. Rx is hypotonic fluids (D5W) started 1/22 and Na+ down to 145 today. Cont D5W at 75 cc/hr for now.  Hypovolemia - sig vol depletion per initial exam, gave 2L bolus and continuing w/ IVF's NS at 75 cc/hr.    AKI - creat 3.6 on admission, down to 1.4 yest and 0.9 today. Likely cause was vol depletion.  Hypophosphatemia - not sure cause, could be caused by high Ca, poor diet, high etoh intake. Improved w/ supplementation.  Pancreatitis - may be related to etoh AMS - per CCM and pmd Hypercalcemia - Ca++ levels have corrected down to 8/0 today New onset DM / severe DKA - per pmd AMS - multifactorial         Rob Koraima Albertsen 01/15/2023, 11:25 AM   Recent Labs  Lab 01/14/23 0259 01/14/23 1520 01/14/23 1551 01/15/23 0051 01/15/23 0439 01/15/23 0838  HGB 14.0  --   --   --  12.6*  --   ALBUMIN 2.9*  --   --   --  2.6*  --    CALCIUM 9.6   < >  --    < > 8.0* 8.1*  PHOS 1.2*  --  2.9  --  2.7  --   CREATININE 1.46*   < >  --    < > 1.01 0.99  K 2.9*   < >  --    < > 5.0 4.0   < > = values in this interval not displayed.    Recent Labs  Lab 01/13/23 0003  IRON 39*  TIBC 284  FERRITIN 2,484*    Inpatient medications:  Chlorhexidine Gluconate Cloth  6 each Topical Daily   enoxaparin (LOVENOX) injection  40 mg Subcutaneous H82X   folic acid  1 mg Oral Daily   multivitamin with minerals  1 tablet Oral Daily   [START ON 01/18/2023] thiamine (VITAMIN B1) injection  100 mg Intravenous Q8H    sodium chloride 75 mL/hr at 01/15/23 0957   dextrose 75 mL/hr at 01/15/23 0957   insulin  7 Units/hr (01/15/23 0957)   thiamine (VITAMIN B1) injection 400 mg (01/15/23 1024)   dextrose, LORazepam **OR** LORazepam, mouth rinse

## 2023-01-16 DIAGNOSIS — E1110 Type 2 diabetes mellitus with ketoacidosis without coma: Secondary | ICD-10-CM | POA: Diagnosis not present

## 2023-01-16 DIAGNOSIS — E870 Hyperosmolality and hypernatremia: Secondary | ICD-10-CM | POA: Insufficient documentation

## 2023-01-16 DIAGNOSIS — K567 Ileus, unspecified: Secondary | ICD-10-CM

## 2023-01-16 LAB — BASIC METABOLIC PANEL
Anion gap: 10 (ref 5–15)
Anion gap: 8 (ref 5–15)
BUN: 25 mg/dL — ABNORMAL HIGH (ref 6–20)
BUN: 27 mg/dL — ABNORMAL HIGH (ref 6–20)
CO2: 16 mmol/L — ABNORMAL LOW (ref 22–32)
CO2: 19 mmol/L — ABNORMAL LOW (ref 22–32)
Calcium: 8.5 mg/dL — ABNORMAL LOW (ref 8.9–10.3)
Calcium: 8 mg/dL — ABNORMAL LOW (ref 8.9–10.3)
Chloride: 115 mmol/L — ABNORMAL HIGH (ref 98–111)
Chloride: 115 mmol/L — ABNORMAL HIGH (ref 98–111)
Creatinine, Ser: 0.94 mg/dL (ref 0.61–1.24)
Creatinine, Ser: 0.98 mg/dL (ref 0.61–1.24)
GFR, Estimated: >60 mL/min (ref >60)
GFR, Estimated: >60 mL/min (ref >60)
Glucose, Bld: 198 mg/dL — ABNORMAL HIGH (ref 70–99)
Glucose, Bld: 195 mg/dL — ABNORMAL HIGH (ref 70–99)
Potassium: 3.5 mmol/L (ref 3.5–5.1)
Potassium: 3.6 mmol/L (ref 3.5–5.1)
Sodium: 141 mmol/L (ref 135–145)
Sodium: 142 mmol/L (ref 135–145)

## 2023-01-16 LAB — CBC
HCT: 41.3 % (ref 39.0–52.0)
Hemoglobin: 13.2 g/dL (ref 13.0–17.0)
MCH: 31.6 pg (ref 26.0–34.0)
MCHC: 32 g/dL (ref 30.0–36.0)
MCV: 98.8 fL (ref 80.0–100.0)
Platelets: 129 10*3/uL — ABNORMAL LOW (ref 150–400)
RBC: 4.18 MIL/uL — ABNORMAL LOW (ref 4.22–5.81)
RDW: 12.5 % (ref 11.5–15.5)
WBC: 12.9 10*3/uL — ABNORMAL HIGH (ref 4.0–10.5)
nRBC: 0 % (ref 0.0–0.2)

## 2023-01-16 LAB — GLUCOSE, CAPILLARY
Glucose-Capillary: 144 mg/dL — ABNORMAL HIGH (ref 70–99)
Glucose-Capillary: 146 mg/dL — ABNORMAL HIGH (ref 70–99)
Glucose-Capillary: 158 mg/dL — ABNORMAL HIGH (ref 70–99)
Glucose-Capillary: 159 mg/dL — ABNORMAL HIGH (ref 70–99)
Glucose-Capillary: 160 mg/dL — ABNORMAL HIGH (ref 70–99)
Glucose-Capillary: 162 mg/dL — ABNORMAL HIGH (ref 70–99)
Glucose-Capillary: 167 mg/dL — ABNORMAL HIGH (ref 70–99)
Glucose-Capillary: 169 mg/dL — ABNORMAL HIGH (ref 70–99)
Glucose-Capillary: 192 mg/dL — ABNORMAL HIGH (ref 70–99)

## 2023-01-16 LAB — COMPREHENSIVE METABOLIC PANEL
ALT: 37 U/L (ref 0–44)
AST: 47 U/L — ABNORMAL HIGH (ref 15–41)
Albumin: 2.6 g/dL — ABNORMAL LOW (ref 3.5–5.0)
Alkaline Phosphatase: 50 U/L (ref 38–126)
Anion gap: 6 (ref 5–15)
BUN: 18 mg/dL (ref 6–20)
CO2: 19 mmol/L — ABNORMAL LOW (ref 22–32)
Calcium: 7.7 mg/dL — ABNORMAL LOW (ref 8.9–10.3)
Chloride: 118 mmol/L — ABNORMAL HIGH (ref 98–111)
Creatinine, Ser: 0.8 mg/dL (ref 0.61–1.24)
GFR, Estimated: >60 mL/min (ref >60)
Glucose, Bld: 152 mg/dL — ABNORMAL HIGH (ref 70–99)
Potassium: 3.7 mmol/L (ref 3.5–5.1)
Sodium: 143 mmol/L (ref 135–145)
Total Bilirubin: 1.4 mg/dL — ABNORMAL HIGH (ref 0.3–1.2)
Total Protein: 6 g/dL — ABNORMAL LOW (ref 6.5–8.1)

## 2023-01-16 LAB — BETA-HYDROXYBUTYRIC ACID: Beta-Hydroxybutyric Acid: 0.65 mmol/L — ABNORMAL HIGH (ref 0.05–0.27)

## 2023-01-16 LAB — CK: Total CK: 746 U/L — ABNORMAL HIGH (ref 49–397)

## 2023-01-16 LAB — AMMONIA: Ammonia: 18 umol/L (ref 9–35)

## 2023-01-16 MED ORDER — POTASSIUM CHLORIDE 10 MEQ/50ML IV SOLN: 10.0000 meq | INTRAVENOUS | Status: DC

## 2023-01-16 MED ORDER — POTASSIUM CHLORIDE 10 MEQ/50ML IV SOLN
10.0000 meq | INTRAVENOUS | Status: AC
  Administered 2023-01-16 (×3): 10 meq via INTRAVENOUS
  Filled 2023-01-16 (×3): qty 50

## 2023-01-16 MED ORDER — ADULT MULTIVITAMIN LIQUID CH: 15.0000 mL | Freq: Every day | ORAL | Status: DC

## 2023-01-16 MED ORDER — ONDANSETRON HCL 4 MG/2ML IJ SOLN
4.0000 mg | Freq: Four times a day (QID) | INTRAMUSCULAR | Status: DC | PRN
  Administered 2023-01-16 – 2023-01-17 (×2): 4 mg via INTRAVENOUS
  Filled 2023-01-16 (×2): qty 2

## 2023-01-16 MED ORDER — PROCHLORPERAZINE EDISYLATE 10 MG/2ML IJ SOLN
10.0000 mg | Freq: Once | INTRAMUSCULAR | Status: AC
  Administered 2023-01-16: 10 mg via INTRAVENOUS
  Filled 2023-01-16: qty 2

## 2023-01-16 MED ORDER — SIMETHICONE 40 MG/0.6ML PO SUSP
40.0000 mg | Freq: Four times a day (QID) | ORAL | Status: DC | PRN
  Administered 2023-01-16 – 2023-01-18 (×3): 40 mg via ORAL
  Filled 2023-01-16 (×5): qty 0.6

## 2023-01-16 MED ORDER — FOLIC ACID 1 MG PO TABS: 1.0000 mg | ORAL_TABLET | Freq: Every day | ORAL | Status: DC

## 2023-01-16 MED ORDER — DEXTROSE IN LACTATED RINGERS 5 % IV SOLN: INTRAVENOUS | Status: DC

## 2023-01-16 NOTE — Progress Notes (Signed)
[facility] Progress Note  Subjective: pt alert up in chair today, much better than prior.   Vitals:   01/16/23 0700 01/16/23 0800 01/16/23 0900 01/16/23 1000  BP: 131/64 (!) 118/98 (!) 125/96 (!) 114/93  Pulse: (!) 101 (!) 103 (!) 106 (!) 106  Resp: (!) 25 (!) 21 (!) 24 (!) 29  Temp:  97.9 F (36.6 C)    TempSrc:  Oral    SpO2: 100% 95% 95% 96%  Weight:      Height:        Exam: Gen no distress, up in chair Sclera anicteric, throat clear  No jvd or bruits Chest clear bilat to bases RRR no MRG Abd soft ntnd no mass or ascites +bs Ext no LE or UE edema Neuro as above          Home meds include - amlodipine-benazepril, lexapro, simvastatin, vits/ supps/ prns          UA >500 bs, neg protein, 0-5 wbc/ rbc, many bact    I/O 2.8 L in and 3.6 L out, net neg 862 cc        US renal - 10.9/ 12.3 cm kidneys w/o hydro    CXR - no active disease   Assessment/ Plan: Hypernatremia - hypovolemic, resolved w/ IV D5W and vol repletion. Will sign off.  Hypovolemia - sig vol depletion mostly resolved, will dc IVFs.  AKI - creat 3.6 on admit, down to < 1.0 now, no further plans,  will sign off.  Hypophosphatemia - not sure cause, could be caused by high Ca, poor diet, high etoh intake. Improved w/ supplementation.  Pancreatitis - may be related to etoh AMS - per CCM and pmd Hypercalcemia - Ca++ levels have corrected down to 7-8 range, suspect severe vol depletion was main issue New onset DM / severe DKA - per pmd AMS - multifactorial         Rob Prakash Kimberling 01/16/2023, 10:55 AM   Recent Labs  Lab 01/14/23 1551 01/15/23 0051 01/15/23 0439 01/15/23 0838 01/15/23 2050 01/16/23 0447  HGB  --   --  12.6*  --   --  13.2  ALBUMIN  --   --  2.6*  --   --  2.6*  CALCIUM  --    < > 8.0*   < > 7.7* 7.7*  PHOS 2.9  --  2.7  --   --   --   CREATININE  --    < > 1.01   < > 0.99 0.80  K  --    < > 5.0   < > 3.2* 3.7   < > = values in this interval not displayed.     Recent Labs  Lab 01/13/23 0003  IRON 39*  TIBC 284  FERRITIN 2,484*    Inpatient medications:  Chlorhexidine Gluconate Cloth  6 each Topical Daily   enoxaparin (LOVENOX) injection  40 mg Subcutaneous Q24H   [START ON 2/42/3536] folic acid  1 mg Per Tube Daily   multivitamin  15 mL Per Tube Daily   sodium chloride flush  10-40 mL Intracatheter Q12H   [START ON 01/18/2023] thiamine (VITAMIN B1) injection  100 mg Intravenous Q8H    dextrose 5% lactated ringers 75 mL/hr at 01/16/23 1035   insulin 4.5 Units/hr (01/16/23 1035)   thiamine (VITAMIN B1) injection Stopped (01/16/23 0531)   dextrose, mouth rinse, sodium chloride flush

## 2023-01-16 NOTE — Progress Notes (Signed)
Patient sipping on ice water throughout day, began to complain of nausea that intermittently came-resolved without medication. Educated patient that clear liquid diet will be discontinued until morning.   Patient has had bowel movement and has been passing gas throughout the day

## 2023-01-16 NOTE — Progress Notes (Signed)
Patient complaining of increased gas pains and is passing gas regularly. MD notified and new orders placed.

## 2023-01-16 NOTE — Progress Notes (Signed)
   NAME:  [facility], MRN:  [id number], DOB:  1966-10-05, LOS: 4 ADMISSION DATE:  01/12/2023, CONSULTATION DATE:  01/12/23 REFERRING MD:  Lupita Leash, CHIEF COMPLAINT:  cough   History of Present Illness:  57 year old man p/w cough, lethargy, confusion.  Initial workup revealed new DKA, pancreatitis, AKI, encephalopathy. Unfortunately encephalopathy has progressed and now with an ileus.  Per wife only drinks 3-5 per week but some concern given presentation that this may be higher.  Now more resp distress and either near obtunded or trying to get out of bed.  PCCM consulted for assumption of care given deterioration.  Pertinent  Medical History  HLD HTN Prediabetes Etoh use vs. abuse  Significant Hospital Events: Including procedures, antibiotic start and stop dates in addition to other pertinent events   1/21 admit 1/24 pccm consult  Interim History / Subjective:  Consult  Objective   Blood pressure 131/64, pulse (!) 101, temperature 98 F (36.7 C), temperature source Oral, resp. rate (!) 25, height 5' 9.5" (1.765 m), weight 97.7 kg, SpO2 100 %.        Intake/Output Summary (Last 24 hours) at 01/16/2023 0815 Last data filed at 01/16/2023 0865 Gross per 24 hour  Intake 4101.43 ml  Output 1800 ml  Net 2301.43 ml    Filed Weights   01/14/23 1434 01/15/23 0414 01/16/23 0403  Weight: 91.2 kg 92.4 kg 97.7 kg    Examination: Appears much more awake and alert today Tachycardic, ext warm Abdomen protuberant, hypoactive BS Lungs diminished bases No edema  K replaced Plts improved WBC stable  Resolved Hospital Problem list   AKI DKA  Assessment & Plan:  Acute metabolic encephalopathy- EtOH w/d vs. Hypercarbic vs. Hepatic vs. ICU delirium; suspect hypoactive delirium given neg EEG, rapid improvement to near baseline today New DM2/DKA ?EtOH pancreatitis Rhabdomyolysis improved but developed during stay Leukocytosis reactive improved Impending respiratory failure- much improved  with encephalopathy improvement Hx EtOH use question abuse Question alcoholic fatty liver disease  He's not belching or having nausea at present, did not tolerate attempts at NGT and wants to try to avoid if able.  Will do mobility + enema.  - Avoid BIPAP - Walk unit + enema - Encourage day/night cycles - Continue endotool for now given ileus - CIWA driven ativan - Thiamine/folate - Given rapid improvement will be available PRN  Erskine Emery MD PCCM

## 2023-01-16 NOTE — Progress Notes (Signed)
PROGRESS NOTE ORHAN MAYORGA  LKG:[id number] DOB: [birth year] DOA: 01/12/2023 PCP: [name], MD   Brief Narrative/Hospital Course: 57 year old male with hypertension elevated LFTs anxiety hyperlipidemia who has been having cough and upper respiratory symptoms for 2 and half weeks-had tested negative for COVID a week ago, presented with lethargy/confusion, tired. In the ED low-grade fever 100.2 tachycardic in the 120s, stable BP and oxygen saturation Labs obtained that showed blood sugar more than 1200, hyperkalemia, AKI w/ creatinine 3.6, transaminitis lactic acidosis 5.9 leukocytosis 18.7 BHB more than 8 COVID-19 influenza negative, lipase elevated 1194,CK4 39 troponin 44.  Normal folate and elevated B12, iron 39 but ferritin high at 2484. Imaging: Chest x-ray low lung volumes, CT chest abdomen abdomen pelvis w/o: Mild stranding/edema of pancreas concerning for pancreatitis, likely reactive/secondary duodenitis, hepatic steatosis, 2.6 cm left renal lesion. RVP panel pending Patient was aggressively volume resuscitated with 2 L bolus, was given vancomycin and Zosyn, placed on IV insulin drip, cefepime Flagyl and admitted for further management Critical care Dr Tacy Learn was consulted but advised stable for [facility] admission Due to severe electrolyte imbalance nephrology was consulted Patient managed aggressive normal saline along with D5W insulin drip phosphorus and potassium replacement.  Electrolytes had significantly improved but patient remains confused with pancreatitis distended abdomen x-ray 1/24 showed ileus.  Seen by critical care 1/24   Subjective:  Seen and examined  He appears much better today alert awake fairly oriented with mild confusion only, currently on the bedside chair, still has abdominal distention but denies abdominal pain nausea vomiting.  Passing gas.   Nursing try to work him up ambulate  Overnight afebrile BP stable mildly tachypneic Labs reviewed this morning with  improved renal function blood sugar in 160s-on insulin as he is n.p.o.  Assessment and Plan: Principal Problem:   DKA (diabetic ketoacidosis) ([place]) Active Problems:   AKI (acute kidney injury) ([place])   Pancreatitis   Hypercalcemia   Elevated LFTs   SIRS (systemic inflammatory response syndrome) ([facility])   Acute metabolic encephalopathy  New onset diabetes DKA History of prediabetes: Blood sugar more than 1200 with anion gap 33  in ED.DKA isresolved with aggressive volume resuscitation and insulin drip.  Patient is n.p.o. with ongoing ileus, continue insulin drip adding D5 RL to maintain blood sugar, DM control following.  Once able to take orally we will transition to basal bolus insulin regimen Recent Labs  Lab 01/13/23 0028 01/13/23 0036 01/16/23 0240 01/16/23 0445 01/16/23 0646 01/16/23 0826 01/16/23 1038  GLUCAP  --    < > 160* 158* 144* 162* 167*  HGBA1C 13.7*  --   --   --   --   --   --    < > = values in this interval not displayed.   Pancreatitis lipase 166>1012 Abdominal distention/Ileus: CT chest abdomen pelvis showed pancreatitis, does have history of alcohol abuse, likely the etiology. TG level normal.  Patient abdomen appears distended> XRAY abd s1/24-shows ileus.  Encourage ambulation, supportive care and IV hydration + enema.  History of alcohol use 3-5 drinks per week Alcoholic hepatitis/abnormal LFTs/possible alcoholic fatty liver disease: At risk of withdrawal continue with CIWA scale Ativan, thiamine folate/multivitamin.  Ethanol level negative drug screen negative.  Monitor and treat withdrawals with CIWA Ativan.  He drinks regularly.    Acute metabolic encephalopathy in the setting of patient's DKA/SIRS possible alcohol withdrawal:Did not need Precedex appreciate PCCM input advised to continue CIWA Ativan, mental status significantly better today.  AKI: 2/2 volume depletion with DKA>  resolved.nephrology signed off, Renal ultrasound no hydronephrosis  borderline mild renal enlargement Recent Labs    01/13/23 1140 01/13/23 1354 01/13/23 2029 01/14/23 0259 01/14/23 1520 01/15/23 0051 01/15/23 0439 01/15/23 0838 01/15/23 2050 01/16/23 0447  BUN 52* 51* 43* 38* 25* 18 20 21* 19 18  CREATININE 1.53* 1.46* 1.43* 1.46* 1.06 1.13 1.01 0.99 0.99 0.80    Severe dehydration with DKA with free water deficit Hypernatremia:resolved Patient's sodium was in normal range which is false in the setting of hyperglycemia with blood sugar more than 1200.Needed significant volume resuscitation with normal saline along with free water D5W.  Stopping NS and D5W.  Adding RL D5W for insulin drip  an hydration .  Nephro signed off on/25  Recent Labs  Lab 01/15/23 0051 01/15/23 0439 01/15/23 0838 01/15/23 2050 01/16/23 0447  NA 149* 145 148* 143 143      Hypophosphatemia:In the setting of DKA/insulin WPV:XYIAXKPV Hypercalcemia-in the setting of severe dehydration: PTH appropriately suppressed, ca normal Hypokalemia: resolved keep >4. Monitor electrolytes Recent Labs  Lab 01/13/23 0028 01/13/23 0253 01/13/23 0959 01/13/23 1140 01/13/23 1354 01/13/23 2029 01/14/23 0259 01/14/23 1520 01/14/23 1551 01/15/23 0051 01/15/23 0439 01/15/23 0838 01/15/23 2050 01/16/23 0447  K 4.0 3.7   < > 3.3*   < > 5.3* 2.9*   < >  --  2.9* 5.0 4.0 3.2* 3.7  CALCIUM 12.4* 12.4*   < > 12.1*   < > 10.2 9.6   < >  --  8.1* 8.0* 8.1* 7.7* 7.7*  MG 3.7* 3.5*  --   --   --   --  1.9  --   --   --   --   --  1.5*  --   PHOS 3.0 1.9*  --  1.1*  --  5.9* 1.2*  --  2.9  --  2.7  --   --   --    < > = values in this interval not displayed.   Vitamin D Deficiency at 19.5-pending calcitriol level- start high dose repalcement once taking po.   Rhabdomyolysis:CK improving nicely on IV fluids. Recent Labs  Lab 01/13/23 0031 01/14/23 0259 01/15/23 0439 01/16/23 0447  CKTOTAL 439* 2,005* 1,269* 746*   Elevated troponin: Patient without chest pain likely demand ischemia  in the setting of severe electrolyte imbalance.  Trops flat  SIRS 2/2 pancreatitis Leucocytosis Lactic acidosis: + procal 3.7>Chest x-ray CT chest on pelvis no obvious infection noted UA WBC 0-5.Suspect this is due to patient's dehydration/dka/pancreatitis> blood culture negative so far> off antibiotics 1/24 Recent Labs  Lab 01/12/23 1423 01/12/23 1602 01/12/23 1932 01/13/23 0003 01/13/23 0031 01/13/23 0959 01/14/23 0259 01/15/23 0439 01/16/23 0447  WBC 18.7*  --   --   --   --  19.8* 16.3* 12.8* 12.9*  LATICACIDVEN 5.9* 6.8* 2.5* 2.3*  --  1.6  --   --   --   PROCALCITON  --   --   --   --  3.79  --   --   --   --    DVT prophylaxis: enoxaparin (LOVENOX) injection 40 mg Start: 01/15/23 1200 SCDs Start: 01/12/23 2346 Code Status:   Code Status: Full Code Family Communication: plan of care discussed with patient/no family at bedside I had updated patient's wife at the bedside previously.    I called and updated his wife 1/24.she understandshis overall clinical situation and that he is critically ill  Patient status is: Inpatient because of DKA/severe electrolyte derangement and  encephalopathy Level of care: Stepdown  Dispo: The patient is from: home            Anticipated disposition: TBD Objective: Vitals last 24 hrs: Vitals:   01/16/23 0700 01/16/23 0800 01/16/23 0900 01/16/23 1000  BP: 131/64 (!) 118/98 (!) 125/96 (!) 114/93  Pulse: (!) 101 (!) 103 (!) 106 (!) 106  Resp: (!) 25 (!) 21 (!) 24 (!) 29  Temp:  97.9 F (36.6 C)    TempSrc:  Oral    SpO2: 100% 95% 95% 96%  Weight:      Height:       Weight change: 6.5 kg  Physical Examination: General exam: AA, fairly oriented with mild confusion weak,older appearing HEENT:Oral mucosa moist, Ear/Nose WNL grossly, dentition normal. Respiratory system: bilaterally clear BS, no use of accessory muscle Cardiovascular system: S1 & S2 +, regular rate, JVD neg. Gastrointestinal system: Abdomen soft, nontender BS+,  moderately distended Nervous System:Alert, awake, moving extremities and grossly nonfocal Extremities: LE ankle edema neg, lower extremities warm Skin: No rashes,no icterus. MSK: Normal muscle bulk,tone, power   Medications reviewed:  Scheduled Meds:  Chlorhexidine Gluconate Cloth  6 each Topical Daily   enoxaparin (LOVENOX) injection  40 mg Subcutaneous Q24H   [START ON 01/17/2023] folic acid  1 mg Per Tube Daily   multivitamin  15 mL Per Tube Daily   sodium chloride flush  10-40 mL Intracatheter Q12H   [START ON 01/18/2023] thiamine (VITAMIN B1) injection  100 mg Intravenous Q8H   Continuous Infusions:  dextrose 5% lactated ringers 75 mL/hr at 01/16/23 1035   insulin 4.5 Units/hr (01/16/23 1035)   thiamine (VITAMIN B1) injection Stopped (01/16/23 0531)    Diet Order             Diet NPO time specified  Diet effective now                   Intake/Output Summary (Last 24 hours) at 01/16/2023 1100 Last data filed at 01/16/2023 1035 Gross per 24 hour  Intake 4076.75 ml  Output 1800 ml  Net 2276.75 ml   Net IO Since Admission: 12,987.4 mL [01/16/23 1100]  Wt Readings from Last 3 Encounters:  01/16/23 97.7 kg  07/31/20 88.5 kg  09/27/19 88.5 kg     Unresulted Labs (From admission, onward)     Start     Ordered   01/15/23 0500  CBC  Daily,   R     Question:  Specimen collection method  Answer:  Lab=Lab collect   01/14/23 0747   01/15/23 0500  Comprehensive metabolic panel  Daily,   R     Question:  Specimen collection method  Answer:  Lab=Lab collect   01/14/23 0853   01/15/23 0500  CK  Daily,   R     Question:  Specimen collection method  Answer:  Lab=Lab collect   01/14/23 1245   01/13/23 1702  PTH-related peptide  Once,   R       Question:  Specimen collection method  Answer:  Lab=Lab collect   01/13/23 1702   01/13/23 0500  CBC with Differential  Tomorrow morning,   R        01/12/23 2343          Data Reviewed: I have personally reviewed following labs  and imaging studies CBC: Recent Labs  Lab 01/12/23 1423 01/12/23 1434 01/12/23 1701 01/13/23 0959 01/14/23 0259 01/15/23 0439 01/16/23 0447  WBC 18.7*  --   --  19.8* 16.3* 12.8* 12.9*  NEUTROABS 16.8*  --   --  17.9*  --   --   --   HGB 17.0   < > 16.0 16.2 14.0 12.6* 13.2  HCT 53.4*   < > 47.0 48.6 44.0 39.7 41.3  MCV 99.1  --   --  94.2 98.9 99.7 98.8  PLT 325  --   --  238 159 100* 129*   < > = values in this interval not displayed.   Basic Metabolic Panel: Recent Labs  Lab 01/13/23 0028 01/13/23 0253 01/13/23 0959 01/13/23 1140 01/13/23 1354 01/13/23 2029 01/14/23 0259 01/14/23 1520 01/14/23 1551 01/15/23 0051 01/15/23 0439 01/15/23 0838 01/15/23 2050 01/16/23 0447  NA 151* 155*   < > 163*   < > 159* 157*   < >  --  149* 145 148* 143 143  K 4.0 3.7   < > 3.3*   < > 5.3* 2.9*   < >  --  2.9* 5.0 4.0 3.2* 3.7  CL 110 118*   < > 126*   < > 126* 128*   < >  --  119* 119* 122* 118* 118*  CO2 25 26   < > 27   < > 24 20*   < >  --  21* 19* 18* 19* 19*  GLUCOSE 726* 567*   < > 277*   < > 237* 302*   < >  --  145* 159* 185* 179* 152*  BUN 66* 64*   < > 52*   < > 43* 38*   < >  --  18 20 21* 19 18  CREATININE 2.27* 2.04*   < > 1.53*   < > 1.43* 1.46*   < >  --  1.13 1.01 0.99 0.99 0.80  CALCIUM 12.4* 12.4*   < > 12.1*   < > 10.2 9.6   < >  --  8.1* 8.0* 8.1* 7.7* 7.7*  MG 3.7* 3.5*  --   --   --   --  1.9  --   --   --   --   --  1.5*  --   PHOS 3.0 1.9*  --  1.1*  --  5.9* 1.2*  --  2.9  --  2.7  --   --   --    < > = values in this interval not displayed.   GFR: Estimated Creatinine Clearance: 118.4 mL/min (by C-G formula based on SCr of 0.8 mg/dL). Liver Function Tests: Recent Labs  Lab 01/12/23 1423 01/14/23 0259 01/15/23 0439 01/16/23 0447  AST 66* 89* 74* 47*  ALT 96* 52* 42 37  ALKPHOS 76 47 45 50  BILITOT 1.8* 0.6 1.0 1.4*  PROT 9.7* 6.3* 6.1* 6.0*  ALBUMIN 4.6 2.9* 2.6* 2.6*   Recent Labs  Lab 01/12/23 1600 01/13/23 0031 01/13/23 0253  01/15/23 0439  LIPASE 1,194* 166* 1,012* 147*   Recent Labs  Lab 01/13/23 0003 01/15/23 1001 01/16/23 0447  AMMONIA 33 54* 18   Coagulation Profile: Recent Labs  Lab 01/12/23 1423  INR 1.3*   No results for input(s): "TSH", "T4TOTAL", "FREET4", "T3FREE", "THYROIDAB" in the last 72 hours.  Sepsis Labs: Recent Labs  Lab 01/12/23 1602 01/12/23 1932 01/13/23 0003 01/13/23 0031 01/13/23 0959  PROCALCITON  --   --   --  3.79  --   LATICACIDVEN 6.8* 2.5* 2.3*  --  1.6    Recent Results (from the past 240 hour(s))  Blood Culture (routine x 2)     Status: None (Preliminary result)   Collection Time: 01/12/23  2:23 PM   Specimen: Left Antecubital; Blood  Result Value Ref Range Status   Specimen Description   Final    LEFT ANTECUBITAL BLOOD Performed at [facility] [place], [address] [place], [place] [address]    Special Requests   Final    Blood Culture adequate volume BOTTLES DRAWN AEROBIC AND ANAEROBIC Performed at [facility] [place], [address] [place], [place] [address]    Culture   Final    NO GROWTH 4 DAYS Performed at [facility], [address] [place], [place] [address]    Report Status PENDING  Incomplete  Resp panel by RT-PCR (RSV, Flu A&B, Covid) Anterior Nasal Swab     Status: None   Collection Time: 01/12/23  2:30 PM   Specimen: Anterior Nasal Swab  Result Value Ref Range Status   SARS Coronavirus 2 by RT PCR NEGATIVE NEGATIVE Final    Comment: (NOTE) SARS-CoV-2 target nucleic acids are NOT DETECTED.  The SARS-CoV-2 RNA is generally detectable in upper respiratory specimens during the acute phase of infection. The lowest concentration of SARS-CoV-2 viral copies this assay can detect is 138 copies/mL. A negative result does not preclude SARS-Cov-2 infection and should not be used as the sole basis for treatment or other patient management decisions. A negative result may occur with  improper specimen collection/handling,  submission of specimen other than nasopharyngeal swab, presence of viral mutation(s) within the areas targeted by this assay, and inadequate number of viral copies(<138 copies/mL). A negative result must be combined with clinical observations, patient history, and epidemiological information. The expected result is Negative.  Fact Sheet for Patients:  BloggerCourse.comhttps://www.fda.gov/media/152166/download  Fact Sheet for Healthcare Providers:  SeriousBroker.ithttps://www.fda.gov/media/152162/download  This test is no t yet approved or cleared by the [place] FDA and  has been authorized for detection and/or diagnosis of SARS-CoV-2 by FDA under an Emergency Use Authorization (EUA). This EUA will remain  in effect (meaning this test can be used) for the duration of the COVID-19 declaration under Section 564(b)(1) of the Act, 21 U.S.C.section 360bbb-3(b)(1), unless the authorization is terminated  or revoked sooner.       Influenza A by PCR NEGATIVE NEGATIVE Final   Influenza B by PCR NEGATIVE NEGATIVE Final    Comment: (NOTE) The Xpert Xpress SARS-CoV-2/FLU/RSV plus assay is intended as an aid in the diagnosis of influenza from Nasopharyngeal swab specimens and should not be used as a sole basis for treatment. Nasal washings and aspirates are unacceptable for Xpert Xpress SARS-CoV-2/FLU/RSV testing.  Fact Sheet for Patients: BloggerCourse.comhttps://www.fda.gov/media/152166/download  Fact Sheet for Healthcare Providers: SeriousBroker.ithttps://www.fda.gov/media/152162/download  This test is not yet approved or cleared by the [place] FDA and has been authorized for detection and/or diagnosis of SARS-CoV-2 by FDA under an Emergency Use Authorization (EUA). This EUA will remain in effect (meaning this test can be used) for the duration of the COVID-19 declaration under Section 564(b)(1) of the Act, 21 U.S.C. section 360bbb-3(b)(1), unless the authorization is terminated or revoked.     Resp Syncytial Virus by PCR NEGATIVE  NEGATIVE Final    Comment: (NOTE) Fact Sheet for Patients: BloggerCourse.comhttps://www.fda.gov/media/152166/download  Fact Sheet for Healthcare Providers: SeriousBroker.ithttps://www.fda.gov/media/152162/download  This test is not yet approved or cleared by the [place] FDA and has been authorized for detection and/or diagnosis of SARS-CoV-2 by FDA under an Emergency Use Authorization (EUA). This  EUA will remain in effect (meaning this test can be used) for the duration of the COVID-19 declaration under Section 564(b)(1) of the Act, 21 U.S.C. section 360bbb-3(b)(1), unless the authorization is terminated or revoked.  Performed at [facility], [address] [place], [place] [address]   Blood Culture (routine x 2)     Status: None (Preliminary result)   Collection Time: 01/12/23  2:37 PM   Specimen: BLOOD RIGHT HAND  Result Value Ref Range Status   Specimen Description   Final    BLOOD RIGHT HAND BLOOD Performed at [facility], [address] [place], [place] [address]    Special Requests   Final    Blood Culture adequate volume BOTTLES DRAWN AEROBIC AND ANAEROBIC Performed at [facility], [address], [place], [place] [address]    Culture   Final    NO GROWTH 4 DAYS Performed at [facility], [address], [place], [place] [address]    Report Status PENDING  Incomplete  Urine Culture     Status: None   Collection Time: 01/12/23  4:43 PM   Specimen: In/Out Cath Urine  Result Value Ref Range Status   Specimen Description   Final    IN/OUT CATH URINE Performed at [facility], [address] [place], [place] [address]    Special Requests   Final    NONE Performed at [facility], [address] [place], [place] [address]    Culture   Final    NO GROWTH Performed at [facility], [address] [place], [place] [address]    Report Status 01/13/2023 FINAL  Final  MRSA Next Gen by PCR, Nasal     Status: None    Collection Time: 01/12/23 11:06 PM  Result Value Ref Range Status   MRSA by PCR Next Gen NOT DETECTED NOT DETECTED Final    Comment: (NOTE) The GeneXpert MRSA Assay (FDA approved for NASAL specimens only), is one component of a comprehensive MRSA colonization surveillance program. It is not intended to diagnose MRSA infection nor to guide or monitor treatment for MRSA infections. Test performance is not FDA approved in patients less than 65 years old. Performed at [facility], [address], [place], [place] [address]   Respiratory (~20 pathogens) panel by PCR     Status: None   Collection Time: 01/12/23 11:52 PM   Specimen: Nasopharyngeal Swab; Respiratory  Result Value Ref Range Status   Adenovirus NOT DETECTED NOT DETECTED Final   Coronavirus 229E NOT DETECTED NOT DETECTED Final    Comment: (NOTE) The Coronavirus on the Respiratory Panel, DOES NOT test for the novel  Coronavirus (2019 nCoV)    Coronavirus HKU1 NOT DETECTED NOT DETECTED Final   Coronavirus NL63 NOT DETECTED NOT DETECTED Final   Coronavirus OC43 NOT DETECTED NOT DETECTED Final   Metapneumovirus NOT DETECTED NOT DETECTED Final   Rhinovirus / Enterovirus NOT DETECTED NOT DETECTED Final   Influenza A NOT DETECTED NOT DETECTED Final   Influenza B NOT DETECTED NOT DETECTED Final   Parainfluenza Virus 1 NOT DETECTED NOT DETECTED Final   Parainfluenza Virus 2 NOT DETECTED NOT DETECTED Final   Parainfluenza Virus 3 NOT DETECTED NOT DETECTED Final   Parainfluenza Virus 4 NOT DETECTED NOT DETECTED Final   Respiratory Syncytial Virus NOT DETECTED NOT DETECTED Final   Bordetella pertussis NOT DETECTED NOT DETECTED Final  Bordetella Parapertussis NOT DETECTED NOT DETECTED Final   Chlamydophila pneumoniae NOT DETECTED NOT DETECTED Final   Mycoplasma pneumoniae NOT DETECTED NOT DETECTED Final    Comment: Performed at [facility], [address] [place], [place] [address]     Antimicrobials: Anti-infectives (From admission, onward)    Start     Dose/Rate Route Frequency Ordered Stop   01/13/23 1800  vancomycin (VANCOREADY) IVPB 1500 mg/300 mL  Status:  Discontinued        1,500 mg 150 mL/hr over 120 Minutes Intravenous Every 24 hours 01/13/23 1404 01/15/23 0817   01/13/23 0100  ceFEPIme (MAXIPIME) 2 g in sodium chloride 0.9 % 100 mL IVPB  Status:  Discontinued        2 g 200 mL/hr over 30 Minutes Intravenous Every 12 hours 01/13/23 0002 01/15/23 0837   01/13/23 0030  metroNIDAZOLE (FLAGYL) IVPB 500 mg  Status:  Discontinued        500 mg 100 mL/hr over 60 Minutes Intravenous Every 12 hours 01/12/23 2343 01/15/23 0837   01/12/23 1740  vancomycin variable dose per unstable renal function (pharmacist dosing)  Status:  Discontinued         Does not apply See admin instructions 01/12/23 1740 01/13/23 1406   01/12/23 1630  vancomycin (VANCOCIN) 500 mg in sodium chloride 0.9 % 100 mL IVPB       See Hyperspace for full Linked Orders Report.   500 mg 100 mL/hr over 60 Minutes Intravenous  Once 01/12/23 1523 01/12/23 1818   01/12/23 1530  vancomycin (VANCOCIN) IVPB 1000 mg/200 mL premix       See Hyperspace for full Linked Orders Report.   1,000 mg 200 mL/hr over 60 Minutes Intravenous  Once 01/12/23 1523 01/12/23 1652   01/12/23 1500  piperacillin-tazobactam (ZOSYN) IVPB 3.375 g        3.375 g 100 mL/hr over 30 Minutes Intravenous  Once 01/12/23 1450 01/12/23 1530      Culture/Microbiology    Component Value Date/Time   SDES  01/12/2023 1643    IN/OUT CATH URINE Performed at [facility] [place], [address] [place], [place] [address]    [facility]  01/12/2023 1643    NONE Performed at [facility] [place], [address] [place], [place] [address]    CULT  01/12/2023 1643    NO GROWTH Performed at [facility], [address] [place], [place] [address]    REPTSTATUS 01/13/2023 FINAL 01/12/2023 1643    Other culture-see note   Radiology Studies: US Abdomen Complete  Result Date: 01/15/2023 CLINICAL DATA:  Pancreatitis. EXAM: ABDOMEN ULTRASOUND COMPLETE COMPARISON:  None Available. FINDINGS: Gallbladder: No gallstones or wall thickening visualized (2.4 mm). No sonographic Murphy sign noted by sonographer. Common bile duct: Not clearly visualized secondary to overlying bowel gas. Liver: No focal lesion identified. Diffusely increased echogenicity of the liver parenchyma is noted. Portal vein is patent on color Doppler imaging with normal direction of blood flow towards the liver. IVC: No abnormality visualized. Pancreas: Visualized portion unremarkable. Spleen: Size (9.1 cm) and appearance within normal limits. Right Kidney: Length: 10.2 cm. Echogenicity within normal limits. No mass or hydronephrosis visualized. Left Kidney: Length: 11.9 cm. Echogenicity within normal limits. A 2.2 cm x 1.9 cm x 2.0 cm simple cyst is seen within the left kidney. No hydronephrosis is visualized. Abdominal aorta: Not visualized. Other findings: Markedly limited study secondary to the patient's body habitus and overlying bowel gas. IMPRESSION: 1.  Limited study secondary to the patient's body habitus and overlying bowel gas. 2. Hepatic steatosis. 3. Simple left renal cyst. Electronically Signed   By: Aram Candelahaddeus  Houston M.D.   On: 01/15/2023 21:32   Rapid EEG  Result Date: 01/15/2023 Charlsie QuestYadav, Priyanka O, MD     01/15/2023  3:11 PM Patient Name: Landis MartinsDarrell G Ragain MRN: [id number] Epilepsy Attending: Charlsie QuestPriyanka O Yadav Referring Physician/Provider: Lorin GlassSmith, Daniel C, MD Date: 01/15/2023 Duration: 38 mins Patient history: 57yo M with ams. EEG to evaluate for seizure Level of alertness: lethargic AEDs during EEG study: None Technical aspects: This EEG was obtained using a 10 lead EEG system positioned circumferentially without any parasagittal coverage (rapid EEG). Computer selected EEG is reviewed as  well as background features and all clinically significant events.  Description: EEG showed continuous generalized 5-8hz  theta-alpha activity admixed with intermittent generalized 2-3hz  delta slowing. Hyperventilation and photic stimulation were not performed.   ABNORMALITY - Continuous slow, generalized IMPRESSION: This limited ceribell EEG is suggestive of moderate diffuse encephalopathy, nonspecific etiology. No seizures or epileptiform discharges were seen throughout the recording. If suspicion for interictal activity remains a concern, a conventional EEG can be considered. Charlsie QuestPriyanka O Yadav   US EKG SITE RITE  Result Date: 01/15/2023 If [facility] image not attached, placement could not be confirmed due to current cardiac rhythm.  DG Abd 1 View  Result Date: 01/15/2023 CLINICAL DATA:  57 year old male with abdominal distension. EXAM: ABDOMEN - 1 VIEW COMPARISON:  CT Abdomen and Pelvis 01/12/2023. FINDINGS: Portable AP supine view at 0847 hours. Gas-filled but nondilated large and small bowel throughout the abdomen and pelvis. Gas in the rectum. Bowel gas has significantly increased from the recent CT. No pneumoperitoneum is evident on this supine view. Other abdominal and pelvic visceral contours appear negative. No acute osseous abnormality identified. IMPRESSION: Gas now throughout nondilated small and large bowel to the rectum. Pattern compatible with Ileus. Electronically Signed   By: Odessa FlemingH  Hall M.D.   On: 01/15/2023 08:53   DG CHEST PORT 1 VIEW  Result Date: 01/15/2023 CLINICAL DATA:  Tachypnea.  DKA and AK I. EXAM: PORTABLE CHEST 1 VIEW COMPARISON:  CT chest and chest x-ray dated January 12, 2023. FINDINGS: The heart size and mediastinal contours are within normal limits. Continued low lung volumes. No focal consolidation, pleural effusion, or pneumothorax. No acute osseous abnormality. IMPRESSION: No active disease. Electronically Signed   By: Obie DredgeWilliam T Derry M.D.   On: 01/15/2023 08:36     LOS: 4 days   Lanae Boastamesh Shelagh Rayman, MD [facility]  01/16/2023, 11:00 AM

## 2023-01-16 NOTE — Progress Notes (Signed)
[facility] Progress Note  SUBJECTIVE:   Interval history: Vincent Thompson was seen and evaluated today at bedside.  He was sitting up in a chair, oriented to person and place, not oriented to time.  Noted that his abdomen feels distended.  He reported having a small bowel movement overnight.  On exam bowel sounds are appreciated.  No nausea or vomiting.  No chest pain or shortness of breath.  Past Medical History:  Diagnosis Date   Hyperlipemia    Hypertension    History reviewed. No pertinent surgical history. Current Facility-Administered Medications  Medication Dose Route Frequency Provider Last Rate Last Admin   Chlorhexidine Gluconate Cloth 2 % PADS 6 each  6 each Topical Daily Doutova, Anastassia, MD   6 each at 01/16/23 0848   dextrose 5 % in lactated ringers infusion   Intravenous Continuous Kc, Ramesh, MD 75 mL/hr at 01/16/23 1035 Infusion Verify at 01/16/23 1035   dextrose 50 % solution 0-50 mL  0-50 mL Intravenous PRN Toy Baker, MD       enoxaparin (LOVENOX) injection 40 mg  40 mg Subcutaneous Q24H Candee Furbish, MD   40 mg at 01/15/23 1331   [START ON 5/40/0867] folic acid (FOLVITE) tablet 1 mg  1 mg Per Tube Daily Green, Terri L, RPH       insulin regular, human (MYXREDLIN) 100 units/ 100 mL infusion   Intravenous Continuous Doutova, Anastassia, MD 4.5 mL/hr at 01/16/23 1035 4.5 Units/hr at 01/16/23 1035   multivitamin liquid 15 mL  15 mL Per Tube Daily Minda Ditto, RPH       Oral care mouth rinse  15 mL Mouth Rinse PRN Doutova, Anastassia, MD       simethicone (MYLICON) 40 YP/9.5KD suspension 40 mg  40 mg Oral QID PRN Kc, Ramesh, MD       sodium chloride flush (NS) 0.9 % injection 10-40 mL  10-40 mL Intracatheter Q12H Candee Furbish, MD   10 mL at 01/16/23 0848   sodium chloride flush (NS) 0.9 % injection 10-40 mL  10-40 mL Intracatheter PRN Candee Furbish, MD       thiamine (VITAMIN B1) 400 mg in sodium chloride 0.9 % 50 mL IVPB  400 mg  Intravenous Q8H Kc, Maren Beach, MD   Stopped at 01/16/23 0531   Followed by   Derrill Memo ON 01/18/2023] thiamine (VITAMIN B1) injection 100 mg  100 mg Intravenous Ruta Hinds, MD       Allergies as of 01/12/2023   (No Known Allergies)   Review of Systems:  Review of Systems  Respiratory:  Negative for shortness of breath.   Cardiovascular:  Negative for chest pain.  Gastrointestinal:  Negative for nausea and vomiting.       Abdominal distention    OBJECTIVE:   Temp:  [97.9 F (36.6 C)-98.9 F (37.2 C)] 97.9 F (36.6 C) (01/25 0800) Pulse Rate:  [62-110] 110 (01/25 1200) Resp:  [17-34] 17 (01/25 1200) BP: (114-147)/(64-101) 115/98 (01/25 1200) SpO2:  [94 %-100 %] 97 % (01/25 1200) Weight:  [97.7 kg] 97.7 kg (01/25 0403) Last BM Date :  (PTA) Physical Exam Constitutional:      General: He is not in acute distress.    Appearance: He is not ill-appearing, toxic-appearing or diaphoretic.  Cardiovascular:     Rate and Rhythm: Normal rate and regular rhythm.  Pulmonary:     Effort: No respiratory distress.     Breath sounds: Normal breath sounds.  Abdominal:  General: There is distension.     Palpations: Abdomen is soft.     Tenderness: There is no abdominal tenderness.     Comments: Soft bowel sounds appreciated  Musculoskeletal:     Right lower leg: No edema.     Left lower leg: No edema.  Skin:    General: Skin is warm and dry.  Neurological:     Mental Status: He is alert.     Comments: Oriented to person and place, not oriented to time.     Labs: Recent Labs    01/14/23 0259 01/15/23 0439 01/16/23 0447  WBC 16.3* 12.8* 12.9*  HGB 14.0 12.6* 13.2  HCT 44.0 39.7 41.3  PLT 159 100* 129*   BMET Recent Labs    01/15/23 0838 01/15/23 2050 01/16/23 0447  NA 148* 143 143  K 4.0 3.2* 3.7  CL 122* 118* 118*  CO2 18* 19* 19*  GLUCOSE 185* 179* 152*  BUN 21* 19 18  CREATININE 0.99 0.99 0.80  CALCIUM 8.1* 7.7* 7.7*   LFT Recent Labs    01/14/23 0259  01/15/23 0439 01/16/23 0447  PROT 6.3*   < > 6.0*  ALBUMIN 2.9*   < > 2.6*  AST 89*   < > 47*  ALT 52*   < > 37  ALKPHOS 47   < > 50  BILITOT 0.6   < > 1.4*  BILIDIR 0.1  --   --   IBILI 0.5  --   --    < > = values in this interval not displayed.   PT/INR No results for input(s): "LABPROT", "INR" in the last 72 hours. Diagnostic imaging: US Abdomen Complete  Result Date: 01/15/2023 CLINICAL DATA:  Pancreatitis. EXAM: ABDOMEN ULTRASOUND COMPLETE COMPARISON:  None Available. FINDINGS: Gallbladder: No gallstones or wall thickening visualized (2.4 mm). No sonographic Murphy sign noted by sonographer. Common bile duct: Not clearly visualized secondary to overlying bowel gas. Liver: No focal lesion identified. Diffusely increased echogenicity of the liver parenchyma is noted. Portal vein is patent on color Doppler imaging with normal direction of blood flow towards the liver. IVC: No abnormality visualized. Pancreas: Visualized portion unremarkable. Spleen: Size (9.1 cm) and appearance within normal limits. Right Kidney: Length: 10.2 cm. Echogenicity within normal limits. No mass or hydronephrosis visualized. Left Kidney: Length: 11.9 cm. Echogenicity within normal limits. A 2.2 cm x 1.9 cm x 2.0 cm simple cyst is seen within the left kidney. No hydronephrosis is visualized. Abdominal aorta: Not visualized. Other findings: Markedly limited study secondary to the patient's body habitus and overlying bowel gas. IMPRESSION: 1. Limited study secondary to the patient's body habitus and overlying bowel gas. 2. Hepatic steatosis. 3. Simple left renal cyst. Electronically Signed   By: Virgina Norfolk M.D.   On: 01/15/2023 21:32   Rapid EEG  Result Date: 01/15/2023 Lora Havens, MD     01/15/2023  3:11 PM Patient Name: Vincent BRUNTY MRN: [id number] Epilepsy Attending: Lora Havens Referring Physician/Provider: Candee Furbish, MD Date: 01/15/2023 Duration: 38 mins Patient history: 57yo M with ams.  EEG to evaluate for seizure Level of alertness: lethargic AEDs during EEG study: None Technical aspects: This EEG was obtained using a 10 lead EEG system positioned circumferentially without any parasagittal coverage (rapid EEG). Computer selected EEG is reviewed as  well as background features and all clinically significant events. Description: EEG showed continuous generalized 5-8hz  theta-alpha activity admixed with intermittent generalized 2-3hz  delta slowing. Hyperventilation and photic stimulation were not  performed.   ABNORMALITY - Continuous slow, generalized IMPRESSION: This limited ceribell EEG is suggestive of moderate diffuse encephalopathy, nonspecific etiology. No seizures or epileptiform discharges were seen throughout the recording. If suspicion for interictal activity remains a concern, a conventional EEG can be considered. Vincent Thompson   [place] EKG SITE RITE  Result Date: 01/15/2023 If [facility] image not attached, placement could not be confirmed due to current cardiac rhythm.  DG Abd 1 View  Result Date: 01/15/2023 CLINICAL DATA:  57 year old male with abdominal distension. EXAM: ABDOMEN - 1 VIEW COMPARISON:  CT Abdomen and Pelvis 01/12/2023. FINDINGS: Portable AP supine view at 0847 hours. Gas-filled but nondilated large and small bowel throughout the abdomen and pelvis. Gas in the rectum. Bowel gas has significantly increased from the recent CT. No pneumoperitoneum is evident on this supine view. Other abdominal and pelvic visceral contours appear negative. No acute osseous abnormality identified. IMPRESSION: Gas now throughout nondilated small and large bowel to the rectum. Pattern compatible with Ileus. Electronically Signed   By: Odessa Fleming M.D.   On: 01/15/2023 08:53   DG CHEST PORT 1 VIEW  Result Date: 01/15/2023 CLINICAL DATA:  Tachypnea.  DKA and AK I. EXAM: PORTABLE CHEST 1 VIEW COMPARISON:  CT chest and chest x-ray dated January 12, 2023. FINDINGS: The heart size and  mediastinal contours are within normal limits. Continued low lung volumes. No focal consolidation, pleural effusion, or pneumothorax. No acute osseous abnormality. IMPRESSION: No active disease. Electronically Signed   By: Obie Dredge M.D.   On: 01/15/2023 08:36    IMPRESSION: Acute pancreatitis, presumed secondary to alcohol use             -Appears alcohol use may be roughly 6 drinks per day prior to admission             -CT scan of abdomen pelvis on 01/12/2023 showed hyperdense gallbladder otherwise unremarkable  -Abdominal ultrasound completed on 01/11/2023 with limited view of biliary system given bowel gas Ileus, abdominal distention, new Diabetic ketoacidosis, since resolved Metabolic encephalopathy Hypernatremia Normocytic anemia  PLAN: -Appreciate critical care team -Okay to attempt clear liquid diet, with strict instructions to nursing to discontinue clear liquids if any nausea or vomiting occur -Would not recommend enema at this juncture, discussed with internal medicine and critical care team -Continue supportive care for pancreatitis -Monitor bowel movements -CIWA protocol -[facility] will follow   LOS: 4 days   Liliane Shi, [facility]

## 2023-01-17 ENCOUNTER — Inpatient Hospital Stay (HOSPITAL_COMMUNITY): Payer: 59

## 2023-01-17 DIAGNOSIS — E1110 Type 2 diabetes mellitus with ketoacidosis without coma: Secondary | ICD-10-CM | POA: Diagnosis not present

## 2023-01-17 LAB — COMPREHENSIVE METABOLIC PANEL
ALT: 35 U/L (ref 0–44)
AST: 45 U/L — ABNORMAL HIGH (ref 15–41)
Albumin: 2.7 g/dL — ABNORMAL LOW (ref 3.5–5.0)
Alkaline Phosphatase: 55 U/L (ref 38–126)
Anion gap: 6 (ref 5–15)
BUN: 26 mg/dL — ABNORMAL HIGH (ref 6–20)
CO2: 21 mmol/L — ABNORMAL LOW (ref 22–32)
Calcium: 7.8 mg/dL — ABNORMAL LOW (ref 8.9–10.3)
Chloride: 117 mmol/L — ABNORMAL HIGH (ref 98–111)
Creatinine, Ser: 0.92 mg/dL (ref 0.61–1.24)
GFR, Estimated: >60 mL/min (ref >60)
Glucose, Bld: 164 mg/dL — ABNORMAL HIGH (ref 70–99)
Potassium: 3.6 mmol/L (ref 3.5–5.1)
Sodium: 144 mmol/L (ref 135–145)
Total Bilirubin: 2 mg/dL — ABNORMAL HIGH (ref 0.3–1.2)
Total Protein: 6.1 g/dL — ABNORMAL LOW (ref 6.5–8.1)

## 2023-01-17 LAB — BASIC METABOLIC PANEL
Anion gap: 5 (ref 5–15)
BUN: 28 mg/dL — ABNORMAL HIGH (ref 6–20)
CO2: 20 mmol/L — ABNORMAL LOW (ref 22–32)
Calcium: 8 mg/dL — ABNORMAL LOW (ref 8.9–10.3)
Chloride: 118 mmol/L — ABNORMAL HIGH (ref 98–111)
Creatinine, Ser: 0.94 mg/dL (ref 0.61–1.24)
GFR, Estimated: >60 mL/min (ref >60)
Glucose, Bld: 159 mg/dL — ABNORMAL HIGH (ref 70–99)
Potassium: 3.7 mmol/L (ref 3.5–5.1)
Sodium: 143 mmol/L (ref 135–145)

## 2023-01-17 LAB — GLUCOSE, CAPILLARY
Glucose-Capillary: 133 mg/dL — ABNORMAL HIGH (ref 70–99)
Glucose-Capillary: 141 mg/dL — ABNORMAL HIGH (ref 70–99)
Glucose-Capillary: 144 mg/dL — ABNORMAL HIGH (ref 70–99)
Glucose-Capillary: 151 mg/dL — ABNORMAL HIGH (ref 70–99)
Glucose-Capillary: 152 mg/dL — ABNORMAL HIGH (ref 70–99)
Glucose-Capillary: 158 mg/dL — ABNORMAL HIGH (ref 70–99)
Glucose-Capillary: 159 mg/dL — ABNORMAL HIGH (ref 70–99)
Glucose-Capillary: 160 mg/dL — ABNORMAL HIGH (ref 70–99)
Glucose-Capillary: 175 mg/dL — ABNORMAL HIGH (ref 70–99)

## 2023-01-17 LAB — CULTURE, BLOOD (ROUTINE X 2)
Culture: NO GROWTH
Culture: NO GROWTH
Special Requests: ADEQUATE
Special Requests: ADEQUATE

## 2023-01-17 LAB — CBC
HCT: 35.5 % — ABNORMAL LOW (ref 39.0–52.0)
Hemoglobin: 11.4 g/dL — ABNORMAL LOW (ref 13.0–17.0)
MCH: 31.9 pg (ref 26.0–34.0)
MCHC: 32.1 g/dL (ref 30.0–36.0)
MCV: 99.4 fL (ref 80.0–100.0)
Platelets: 134 10*3/uL — ABNORMAL LOW (ref 150–400)
RBC: 3.57 MIL/uL — ABNORMAL LOW (ref 4.22–5.81)
RDW: 12.5 % (ref 11.5–15.5)
WBC: 16 10*3/uL — ABNORMAL HIGH (ref 4.0–10.5)
nRBC: 0.2 % (ref 0.0–0.2)

## 2023-01-17 LAB — PTH-RELATED PEPTIDE: PTH-related peptide: <2 pmol/L

## 2023-01-17 LAB — CK: Total CK: 531 U/L — ABNORMAL HIGH (ref 49–397)

## 2023-01-17 LAB — BETA-HYDROXYBUTYRIC ACID: Beta-Hydroxybutyric Acid: 0.64 mmol/L — ABNORMAL HIGH (ref 0.05–0.27)

## 2023-01-17 MED ORDER — INSULIN STARTER KIT- PEN NEEDLES (ENGLISH)
1.0000 | Freq: Once | Status: DC
  Filled 2023-01-17: qty 1

## 2023-01-17 MED ORDER — LORAZEPAM 1 MG PO TABS: 1.0000 mg | ORAL_TABLET | ORAL | Status: AC | PRN

## 2023-01-17 MED ORDER — LORAZEPAM 2 MG/ML IJ SOLN: 1.0000 mg | INTRAMUSCULAR | Status: AC | PRN

## 2023-01-17 MED ORDER — LIVING WELL WITH DIABETES BOOK
Freq: Once | Status: AC
  Administered 2023-01-17: 1
  Filled 2023-01-17: qty 1

## 2023-01-17 NOTE — Progress Notes (Addendum)
Initial Nutrition Assessment  DOCUMENTATION CODES:   Not applicable  INTERVENTION:  - Advance diet as medically appropriate.   - Patient is on day 5 of minimal/no nutrition. If unable to advance diet in the next 1-2 days would recommend starting TPN if medically appropriate.   Addendum: - Consult received for new onset diabetes education. Provided patient family with diabetes nutrition therapy education and handouts on 1/23, see note for further details. Will follow up with patient regarding diabetes next week once more stable and on a diet.    NUTRITION DIAGNOSIS:   Inadequate oral intake related to inability to eat (ileus) as evidenced by NPO status.  GOAL:   Patient will meet greater than or equal to 90% of their needs  MONITOR:   Diet advancement, Weight trends  REASON FOR ASSESSMENT:   NPO/Clear Liquid Diet, Diagnosis Diet education (new diabetes)  ASSESSMENT:   57 year old male with HTN, elevated LFTs, anxiety, HLD who has been having cough and upper respiratory symptoms for 2 and half weeks who presented with lethargy/confusion, tired. Found to have new onset diabetes in DKA and pancreatitis.   1/21 Admit, NPO due to lethargy and confusion 1/23 RD provided new diabetes diet education to family 1/25 Diet advanced to clear liquids; patient with nausea and vomiting in evening so back to NPO   Patient alert and oriented at time of visit this morning. Sitting in bedside chair with emesis bag in hand. Reports he vomited again this morning and is still have nausea.  Patient reports UBW of 200# and denied recent changes in weight. Weights this admission have varied from 180-215#. Patient is currently +14L as well, so current weight status difficult to assess at this time.  He endorses eating well PTA, usually has 3 meals a day.   Patient reports he has been burping a lot and had a bowel movement yesterday.  GI is recommending bowel rest with NPO today due to N/V.    Patient is now on day 5 of minimal nutrition due to NPO. If patient remains unable to tolerate oral diet may need to consider TPN.    Medications reviewed and include: Folic acid, MVI, Thiamine, D5W at 46mL/hr (provides 206 kcals over 24 hours)  Labs reviewed:  HA1C 13.7 Blood glucose 133-192 x24 hours   NUTRITION - FOCUSED PHYSICAL EXAM:  Flowsheet Row Most Recent Value  Orbital Region No depletion  Upper Arm Region No depletion  Thoracic and Lumbar Region No depletion  Buccal Region No depletion  Temple Region No depletion  Clavicle Bone Region No depletion  Clavicle and Acromion Bone Region No depletion  Scapular Bone Region Unable to assess  Dorsal Hand No depletion  Patellar Region No depletion  Anterior Thigh Region No depletion  Posterior Calf Region No depletion  Edema (RD Assessment) None  Hair Reviewed  Eyes Reviewed  Mouth Reviewed  Skin Reviewed  Nails Reviewed       Diet Order:   Diet Order             Diet NPO time specified  Diet effective now                   EDUCATION NEEDS:  Education needs have been addressed  Skin:  Skin Assessment: Reviewed RN Assessment  Last BM:  1/25  Height:  Ht Readings from Last 1 Encounters:  01/12/23 5' 9.5" (1.765 m)   Weight:  *Will utilize initial admission weight as patient currently +14L which is likely affecting  current weight status Wt Readings from Last 1 Encounters:  01/12/23 81.6 kg   BMI:  Body mass index is 26.19 kg/m.  Estimated Nutritional Needs:  Kcal:  2200-2450 kcals Protein:  100-115 grams Fluid:  >/= 2.2L    Samson Frederic RD, LDN For contact information, refer to [facility].

## 2023-01-17 NOTE — Progress Notes (Signed)
[facility] Progress Note  SUBJECTIVE:   Interval history: Vincent Thompson was seen and evaluated today at bedside.  Resting comfortably in bed.  Noted having continued abdominal distention.  Vincent Thompson had episode of bilious emesis this morning.  Alert, awake and oriented to person place and time.  Denied chest pain or shortness of breath.  Past Medical History:  Diagnosis Date   Hyperlipemia    Hypertension    History reviewed. No pertinent surgical history. Current Facility-Administered Medications  Medication Dose Route Frequency Provider Last Rate Last Admin   Chlorhexidine Gluconate Cloth 2 % PADS 6 each  6 each Topical Daily Doutova, Anastassia, MD   6 each at 01/16/23 0848   dextrose 5 % in lactated ringers infusion   Intravenous Continuous Kc, Ramesh, MD 75 mL/hr at 01/17/23 0900 Infusion Verify at 01/17/23 0900   dextrose 50 % solution 0-50 mL  0-50 mL Intravenous PRN Therisa Doyne, MD       enoxaparin (LOVENOX) injection 40 mg  40 mg Subcutaneous Q24H Lorin Glass, MD   40 mg at 01/16/23 1237   folic acid (FOLVITE) tablet 1 mg  1 mg Per Tube Daily Green, Terri L, RPH       insulin regular, human (MYXREDLIN) 100 units/ 100 mL infusion   Intravenous Continuous Doutova, Anastassia, MD 4 mL/hr at 01/17/23 0900 4 Units/hr at 01/17/23 0900   LORazepam (ATIVAN) tablet 1-4 mg  1-4 mg Oral Q1H PRN Anthoney Harada, NP       Or   LORazepam (ATIVAN) injection 1-4 mg  1-4 mg Intravenous Q1H PRN Anthoney Harada, NP       multivitamin liquid 15 mL  15 mL Per Tube Daily Green, Terri L, RPH       ondansetron (ZOFRAN) injection 4 mg  4 mg Intravenous Q6H PRN Anthoney Harada, NP   4 mg at 01/17/23 0400   Oral care mouth rinse  15 mL Mouth Rinse PRN Doutova, Jonny Ruiz, MD       simethicone (MYLICON) 40 MG/0.6ML suspension 40 mg  40 mg Oral QID PRN Kc, Dayna Barker, MD   40 mg at 01/16/23 1237   sodium chloride flush (NS) 0.9 % injection 10-40 mL  10-40 mL Intracatheter Q12H Lorin Glass,  MD   40 mL at 01/16/23 2023   sodium chloride flush (NS) 0.9 % injection 10-40 mL  10-40 mL Intracatheter PRN Lorin Glass, MD       thiamine (VITAMIN B1) 400 mg in sodium chloride 0.9 % 50 mL IVPB  400 mg Intravenous Vincent Ochoa, MD   Stopped at 01/17/23 0535   Followed by   Melene Muller ON 01/18/2023] thiamine (VITAMIN B1) injection 100 mg  100 mg Intravenous Vincent Ochoa, MD       Allergies as of 01/12/2023   (No Known Allergies)   Review of Systems:  Review of Systems  Respiratory:  Negative for shortness of breath.   Cardiovascular:  Negative for chest pain.  Gastrointestinal:  Positive for nausea and vomiting. Negative for abdominal pain, constipation and diarrhea.    OBJECTIVE:   Temp:  [97.3 F (36.3 C)-99.9 F (37.7 C)] 98.2 F (36.8 C) (01/26 0800) Pulse Rate:  [56-120] 113 (01/26 0900) Resp:  [17-33] 27 (01/26 0900) BP: (97-146)/(66-108) 113/94 (01/26 0900) SpO2:  [92 %-99 %] 95 % (01/26 0900) Weight:  [97.5 kg] 97.5 kg (01/26 0408) Last BM Date : 01/16/23 Physical Exam Constitutional:      General: Vincent Thompson is not  in acute distress.    Appearance: Vincent Thompson is not ill-appearing, toxic-appearing or diaphoretic.  Cardiovascular:     Rate and Rhythm: Normal rate and regular rhythm.  Pulmonary:     Effort: No respiratory distress.     Breath sounds: Normal breath sounds.  Abdominal:     General: There is distension.     Palpations: Abdomen is soft.     Tenderness: There is no abdominal tenderness. There is no guarding.  Skin:    General: Skin is warm and dry.  Neurological:     Mental Status: Vincent Thompson is alert.     Labs: Recent Labs    01/15/23 0439 01/16/23 0447 01/17/23 0431  WBC 12.8* 12.9* 16.0*  HGB 12.6* 13.2 11.4*  HCT 39.7 41.3 35.5*  PLT 100* 129* 134*   BMET Recent Labs    01/16/23 1238 01/16/23 2040 01/17/23 0431  NA 141 142 144  K 3.5 3.6 3.6  CL 115* 115* 117*  CO2 16* 19* 21*  GLUCOSE 198* 195* 164*  BUN 25* 27* 26*  CREATININE 0.94 0.98 0.92   CALCIUM 8.5* 8.0* 7.8*   LFT Recent Labs    01/17/23 0431  PROT 6.1*  ALBUMIN 2.7*  AST 45*  ALT 35  ALKPHOS 55  BILITOT 2.0*   PT/INR No results for input(s): "LABPROT", "INR" in the last 72 hours. Diagnostic imaging: US Abdomen Complete  Result Date: 01/15/2023 CLINICAL DATA:  Pancreatitis. EXAM: ABDOMEN ULTRASOUND COMPLETE COMPARISON:  None Available. FINDINGS: Gallbladder: No gallstones or wall thickening visualized (2.4 mm). No sonographic Murphy sign noted by sonographer. Common bile duct: Not clearly visualized secondary to overlying bowel gas. Liver: No focal lesion identified. Diffusely increased echogenicity of the liver parenchyma is noted. Portal vein is patent on color Doppler imaging with normal direction of blood flow towards the liver. IVC: No abnormality visualized. Pancreas: Visualized portion unremarkable. Spleen: Size (9.1 cm) and appearance within normal limits. Right Kidney: Length: 10.2 cm. Echogenicity within normal limits. No mass or hydronephrosis visualized. Left Kidney: Length: 11.9 cm. Echogenicity within normal limits. A 2.2 cm x 1.9 cm x 2.0 cm simple cyst is seen within the left kidney. No hydronephrosis is visualized. Abdominal aorta: Not visualized. Other findings: Markedly limited study secondary to the patient's body habitus and overlying bowel gas. IMPRESSION: 1. Limited study secondary to the patient's body habitus and overlying bowel gas. 2. Hepatic steatosis. 3. Simple left renal cyst. Electronically Signed   By: Virgina Norfolk M.D.   On: 01/15/2023 21:32   Rapid EEG  Result Date: 01/15/2023 Lora Havens, MD     01/15/2023  3:11 PM Patient Name: TAHIR BLANK MRN: [id number] Epilepsy Attending: Lora Havens Referring Physician/Provider: Candee Furbish, MD Date: 01/15/2023 Duration: 38 mins Patient history: 57yo M with ams. EEG to evaluate for seizure Level of alertness: lethargic AEDs during EEG study: None Technical aspects: This EEG was  obtained using a 10 lead EEG system positioned circumferentially without any parasagittal coverage (rapid EEG). Computer selected EEG is reviewed as  well as background features and all clinically significant events. Description: EEG showed continuous generalized 5-8hz  theta-alpha activity admixed with intermittent generalized 2-3hz  delta slowing. Hyperventilation and photic stimulation were not performed.   ABNORMALITY - Continuous slow, generalized IMPRESSION: This limited ceribell EEG is suggestive of moderate diffuse encephalopathy, nonspecific etiology. No seizures or epileptiform discharges were seen throughout the recording. If suspicion for interictal activity remains a concern, a conventional EEG can be considered. Priyanka Barbra Sarks  IMPRESSION: Acute pancreatitis, presumed secondary to alcohol use             -Appears alcohol use may be roughly 6 drinks per day prior to admission             -CT scan of abdomen pelvis on 01/12/2023 showed hyperdense gallbladder otherwise unremarkable             -Abdominal ultrasound completed on 01/11/2023 with limited view of biliary system given bowel gas Ileus, abdominal distention  -With nausea and vomiting today Diabetic ketoacidosis, since resolved Metabolic encephalopathy Hypernatremia Normocytic anemia  PLAN: -Continue supportive care for pancreatitis and ileus -Would recommend bowel rest today given nausea and vomiting, n.p.o. -Monitor bowel movements and abdominal exam -GI will follow   LOS: 5 days   Danton Clap, [facility]

## 2023-01-17 NOTE — Progress Notes (Addendum)
PROGRESS NOTE Vincent MartinsDarrell G Thompson  ZOX:[id number] DOB: 02/17/1966 DOA: 01/12/2023 PCP: [name], Michael, MD   Brief Narrative/Hospital Course: 57 year old male with hypertension elevated LFTs anxiety hyperlipidemia who has been having cough and upper respiratory symptoms for 2 and half weeks-had tested negative for COVID a week ago, presented with lethargy/confusion, tired. In the ED low-grade fever 100.2 tachycardic in the 120s, stable BP and oxygen saturation Labs obtained that showed blood sugar more than 1200, hyperkalemia, AKI w/ creatinine 3.6, transaminitis lactic acidosis 5.9 leukocytosis 18.7 BHB more than 8 COVID-19 influenza negative, lipase elevated 1194,CK4 39 troponin 44.  Normal folate and elevated B12, iron 39 but ferritin high at 2484. Imaging: Chest x-ray low lung volumes, CT chest abdomen abdomen pelvis w/o: Mild stranding/edema of pancreas concerning for pancreatitis, likely reactive/secondary duodenitis, hepatic steatosis, 2.6 cm left renal lesion. RVP panel pending Patient was aggressively volume resuscitated with 2 L bolus, was given vancomycin and Zosyn, placed on IV insulin drip, cefepime Flagyl and admitted for further management Critical care Dr Merrily Pewhand was consulted but [facility] Due to severe electrolyte imbalance nephrology was consulted Patient managed aggressive normal saline along with D5W insulin drip phosphorus and potassium replacement.  Electrolytes had significantly improved but patient remains confused with pancreatitis distended abdomen x-ray 1/24 showed ileus.  Seen by critical care. 1/25: Much less confused, passing gas and had bowel movement.   Subjective: Seen and examined. He just got up from bed to the bedside chair Complains of having nausea, vomited overnight, feels somewhat better after being up. Passing flatus, no BM today Overnight Tmax 99.9 BP stable Bump in WBC count at 16, electrolytes remains stable/better  Assessment and  Plan: Principal Problem:   DKA (diabetic ketoacidosis) (HCC) Active Problems:   AKI (acute kidney injury) (HCC)   Pancreatitis   Hypercalcemia   Elevated LFTs   SIRS (systemic inflammatory response syndrome) (HCC)   Acute metabolic encephalopathy   Ileus (HCC)   Hypernatremia  New onset diabetes w/ hx of prediabetes-uncontrolled hba1c at 13 w/ uncontrolled hyperglycemia DKA Blood sugar more than 1200 with anion gap 33  in ED.DKA isresolved with aggressive volume resuscitation and insulin drip.  Patient is n.p.o. with ongoing ileus, and on insulin drip +D5 R.DM coordinator o/b.Once able to take orally we will transition to basal bolus insulin regimen.  Continue to follow Endotool. Recent Labs  Lab 01/13/23 0028 01/13/23 0036 01/16/23 1434 01/16/23 1616 01/16/23 1926 01/17/23 0044 01/17/23 0251  GLUCAP  --    < > 169* 159* 192* 151* 133*  HGBA1C 13.7*  --   --   --   --   --   --    < > = values in this interval not displayed.   Pancreatitis lipase [phone]>1012>147 Ileus with abdominal distention nausea and vomiting: CT chest abdomen pelvis in ED showed pancreatitis-likely EtOH induced.TG level normal.  Patient abdomen noted to be distended> XRAY abd s1/24-showed ileus.  Ultrasound 1/24 hepatic steatosis no other acute finding.ongoing nausea vomiting abdominal distention > encourage ambulation PT OT IV fluid hydration >Was on clear liquid diet 1/25 but had nausea so held. He is passing flatus but no BM today.  GI input appreciated recommending bowel rest today.  History of alcohol use 3-5 drinks per week Alcoholic hepatitis/abnormal LFTs/possible alcoholic fatty liver disease: Suspect he had withdrawal with delerium.Continue with CIWA scale Ativan, thiamine folate/multivitamin.  Ethanol level negative drug screen negative.  Cessation advised  Acute metabolic encephalopathy in the setting of patient's DKA/SIRS possible  alcohol withdrawal delirium:Did not need Precedex appreciate PCCM  input continue CIWA Ativan, Thiamine.  Ammonia slightly elevated.Mental status much improved since 1/24, continue current supportive care delirium precaution   AKI: 2/2 volume depletion with DKA> resolved.nephrology signed off, Renal ultrasound no hydronephrosis borderline mild renal enlargement   Severe dehydration with DKA with free water deficit Hypernatremia:resolved Patient's sodium was in normal range which is false in the setting of hyperglycemia with blood sugar more than 1200.Needed significant volume resuscitation with normal saline along with free water D5W.  Stopped  NS and D5W 1/25> cont RL D5W w/ insulin drip and for ileus     Hypophosphatemia:In the setting of DKA/insulin UVO:ZDGUYQIH. Hypercalcemia-in the setting of severe dehydration: PTH appropriately suppressed, ca normal. Hypokalemia: resolved keep >4. Monitor electrolytes. Recent Labs  Lab 01/13/23 0028 01/13/23 0253 01/13/23 0959 01/13/23 1140 01/13/23 1354 01/13/23 2029 01/14/23 0259 01/14/23 1520 01/14/23 1551 01/15/23 0051 01/15/23 0439 01/15/23 0838 01/15/23 2050 01/16/23 0009 01/16/23 0447 01/16/23 1238 01/16/23 2040 01/17/23 0431  K 4.0 3.7   < > 3.3*   < > 5.3* 2.9*   < >  --    < > 5.0   < > 3.2* 3.7 3.7 3.5 3.6 3.6  CALCIUM 12.4* 12.4*   < > 12.1*   < > 10.2 9.6   < >  --    < > 8.0*   < > 7.7* 8.0* 7.7* 8.5* 8.0* 7.8*  MG 3.7* 3.5*  --   --   --   --  1.9  --   --   --   --   --  1.5*  --   --   --   --   --   PHOS 3.0 1.9*  --  1.1*  --  5.9* 1.2*  --  2.9  --  2.7  --   --   --   --   --   --   --    < > = values in this interval not displayed.   Vitamin D Deficiency at 19.5-pending calcitriol level- start high dose repalcement once taking po.   Rhabdomyolysis: Nontraumatic, CK improving nicely on IV fluids. Recent Labs  Lab 01/13/23 0031 01/14/23 0259 01/15/23 0439 01/16/23 0447 01/17/23 0431  CKTOTAL 439* 2,005* 1,269* 746* 531*   Elevated troponin: Patient without chest pain likely  demand ischemia in the setting of severe electrolyte imbalance.  Trops flat  SIRS 2/2 pancreatitis Leucocytosis Lactic acidosis: +procal 3.7>Cxr,CT chest on pelvis no obvious infection noted UA WBC 0-5.Suspect this is due to patient's dehydration/dka/pancreatitis> blood culture negative so far> off antibiotics 1/24.  WBC count fluctuating.  Monitor temperature curve Recent Labs  Lab 01/12/23 1423 01/12/23 1602 01/12/23 1932 01/13/23 0003 01/13/23 0031 01/13/23 0959 01/14/23 0259 01/15/23 0439 01/16/23 0447 01/17/23 0431  WBC 18.7*  --   --   --   --  19.8* 16.3* 12.8* 12.9* 16.0*  LATICACIDVEN 5.9* 6.8* 2.5* 2.3*  --  1.6  --   --   --   --   PROCALCITON  --   --   --   --  3.79  --   --   --   --   --    Xray abd reviewed w/ Gen Surgery Team Kelly: Suspecting ileus less likely bowel obstruction, continue current supportive measures as per GI  DVT prophylaxis: enoxaparin (LOVENOX) injection 40 mg Start: 01/15/23 1200 SCDs Start: 01/12/23 2346 Code Status:   Code Status: Full Code  Family Communication: plan of care discussed with patient/no family at bedside I had updated patient's wife at the bedside previously.  Family were updated at bedside 1/25 evening  Patient status is: Inpatient because of DKA/severe electrolyte derangement and encephalopathy Level of care: Stepdown  Dispo: The patient is from:Home            Anticipated disposition:TBD Objective: Vitals last 24 hrs: Vitals:   01/17/23 0408 01/17/23 0419 01/17/23 0500 01/17/23 0600  BP:   122/84 114/76  Pulse:   (!) 106 (!) 109  Resp:   (!) 27 (!) 23  Temp:  98.3 F (36.8 C)    TempSrc:  Oral    SpO2:   97% 97%  Weight: 97.5 kg     Height:       Weight change: -0.2 kg  Physical Examination: General exam: AA, oriented, pleasant weak,older appearing HEENT:Oral mucosa moist, Ear/Nose WNL grossly, dentition normal. Respiratory system: bilaterally clear BS, no use of accessory muscle Cardiovascular system: S1 &  S2 +, regular rate. Gastrointestinal system: Abdomen soft, significantly distended, bowel sounds present NT, Nervous System:Alert, awake, moving extremities and grossly nonfocal Extremities: LE ankle edema neg, lower extremities warm Skin: No rashes,no icterus. MSK: Normal muscle bulk,tone, power   Medications reviewed:  Scheduled Meds:  Chlorhexidine Gluconate Cloth  6 each Topical Daily   enoxaparin (LOVENOX) injection  40 mg Subcutaneous Y30Z   folic acid  1 mg Per Tube Daily   multivitamin  15 mL Per Tube Daily   sodium chloride flush  10-40 mL Intracatheter Q12H   [START ON 01/18/2023] thiamine (VITAMIN B1) injection  100 mg Intravenous Q8H   Continuous Infusions:  dextrose 5% lactated ringers 75 mL/hr at 01/17/23 0600   insulin 4 Units/hr (01/17/23 0600)   thiamine (VITAMIN B1) injection Stopped (01/17/23 0535)    Diet Order             Diet NPO time specified  Diet effective now                   Intake/Output Summary (Last 24 hours) at 01/17/2023 0731 Last data filed at 01/17/2023 0600 Gross per 24 hour  Intake 2070.24 ml  Output 600 ml  Net 1470.24 ml   Net IO Since Admission: 14,053.94 mL [01/17/23 0731]  Wt Readings from Last 3 Encounters:  01/17/23 97.5 kg  07/31/20 88.5 kg  09/27/19 88.5 kg     Unresulted Labs (From admission, onward)     Start     Ordered   01/15/23 0500  CBC  Daily,   R     Question:  Specimen collection method  Answer:  Lab=Lab collect   01/14/23 0747   01/15/23 0500  Comprehensive metabolic panel  Daily,   R     Question:  Specimen collection method  Answer:  Lab=Lab collect   01/14/23 0853   01/15/23 0500  CK  Daily,   R     Question:  Specimen collection method  Answer:  Lab=Lab collect   01/14/23 1245   01/13/23 1702  PTH-related peptide  Once,   R       Question:  Specimen collection method  Answer:  Lab=Lab collect   01/13/23 1702   01/13/23 0500  CBC with Differential  Tomorrow morning,   R        01/12/23 2343           Data Reviewed: I have personally reviewed following labs and imaging studies CBC: Recent  Labs  Lab 01/12/23 1423 01/12/23 1434 01/13/23 0959 01/14/23 0259 01/15/23 0439 01/16/23 0447 01/17/23 0431  WBC 18.7*  --  19.8* 16.3* 12.8* 12.9* 16.0*  NEUTROABS 16.8*  --  17.9*  --   --   --   --   HGB 17.0   < > 16.2 14.0 12.6* 13.2 11.4*  HCT 53.4*   < > 48.6 44.0 39.7 41.3 35.5*  MCV 99.1  --  94.2 98.9 99.7 98.8 99.4  PLT 325  --  238 159 100* 129* 134*   < > = values in this interval not displayed.   Basic Metabolic Panel: Recent Labs  Lab 01/13/23 0028 01/13/23 0253 01/13/23 0959 01/13/23 1140 01/13/23 1354 01/13/23 2029 01/14/23 0259 01/14/23 1520 01/14/23 1551 01/15/23 0051 01/15/23 0439 01/15/23 5035 01/15/23 2050 01/16/23 0009 01/16/23 0447 01/16/23 1238 01/16/23 2040 01/17/23 0431  NA 151* 155*   < > 163*   < > 159* 157*   < >  --    < > 145   < > 143 143 143 141 142 144  K 4.0 3.7   < > 3.3*   < > 5.3* 2.9*   < >  --    < > 5.0   < > 3.2* 3.7 3.7 3.5 3.6 3.6  CL 110 118*   < > 126*   < > 126* 128*   < >  --    < > 119*   < > 118* 118* 118* 115* 115* 117*  CO2 25 26   < > 27   < > 24 20*   < >  --    < > 19*   < > 19* 20* 19* 16* 19* 21*  GLUCOSE 726* 567*   < > 277*   < > 237* 302*   < >  --    < > 159*   < > 179* 159* 152* 198* 195* 164*  BUN 66* 64*   < > 52*   < > 43* 38*   < >  --    < > 20   < > 19 28* 18 25* 27* 26*  CREATININE 2.27* 2.04*   < > 1.53*   < > 1.43* 1.46*   < >  --    < > 1.01   < > 0.99 0.94 0.80 0.94 0.98 0.92  CALCIUM 12.4* 12.4*   < > 12.1*   < > 10.2 9.6   < >  --    < > 8.0*   < > 7.7* 8.0* 7.7* 8.5* 8.0* 7.8*  MG 3.7* 3.5*  --   --   --   --  1.9  --   --   --   --   --  1.5*  --   --   --   --   --   PHOS 3.0 1.9*  --  1.1*  --  5.9* 1.2*  --  2.9  --  2.7  --   --   --   --   --   --   --    < > = values in this interval not displayed.   GFR: Estimated Creatinine Clearance: 102.9 mL/min (by C-G formula based on SCr of  0.92 mg/dL). Liver Function Tests: Recent Labs  Lab 01/12/23 1423 01/14/23 0259 01/15/23 0439 01/16/23 0447 01/17/23 0431  AST 66* 89* 74* 47* 45*  ALT 96* 52* 42 37 35  ALKPHOS 76 47 45  50 55  BILITOT 1.8* 0.6 1.0 1.4* 2.0*  PROT 9.7* 6.3* 6.1* 6.0* 6.1*  ALBUMIN 4.6 2.9* 2.6* 2.6* 2.7*   Recent Labs  Lab 01/12/23 1600 01/13/23 0031 01/13/23 0253 01/15/23 0439  LIPASE 1,194* 166* 1,012* 147*   Recent Labs  Lab 01/13/23 0003 01/15/23 1001 01/16/23 0447  AMMONIA 33 54* 18   Coagulation Profile: Recent Labs  Lab 01/12/23 1423  INR 1.3*   No results for input(s): "TSH", "T4TOTAL", "FREET4", "T3FREE", "THYROIDAB" in the last 72 hours.  Sepsis Labs: Recent Labs  Lab 01/12/23 1602 01/12/23 1932 01/13/23 0003 01/13/23 0031 01/13/23 0959  PROCALCITON  --   --   --  3.79  --   LATICACIDVEN 6.8* 2.5* 2.3*  --  1.6    Recent Results (from the past 240 hour(s))  Blood Culture (routine x 2)     Status: None (Preliminary result)   Collection Time: 01/12/23  2:23 PM   Specimen: Left Antecubital; Blood  Result Value Ref Range Status   Specimen Description   Final    LEFT ANTECUBITAL BLOOD Performed at [facility] [place], [address] [place], [place] [address]    Special Requests   Final    Blood Culture adequate volume BOTTLES DRAWN AEROBIC AND ANAEROBIC Performed at [facility] [place], [address] [place], [place] [address]    Culture   Final    NO GROWTH 4 DAYS Performed at [facility], [address] [place], [place] [address]    Report Status PENDING  Incomplete  Resp panel by RT-PCR (RSV, Flu A&B, Covid) Anterior Nasal Swab     Status: None   Collection Time: 01/12/23  2:30 PM   Specimen: Anterior Nasal Swab  Result Value Ref Range Status   SARS Coronavirus 2 by RT PCR NEGATIVE NEGATIVE Final    Comment: (NOTE) SARS-CoV-2 target nucleic acids are NOT DETECTED.  The SARS-CoV-2 RNA is generally detectable in upper  respiratory specimens during the acute phase of infection. The lowest concentration of SARS-CoV-2 viral copies this assay can detect is 138 copies/mL. A negative result does not preclude SARS-Cov-2 infection and should not be used as the sole basis for treatment or other patient management decisions. A negative result may occur with  improper specimen collection/handling, submission of specimen other than nasopharyngeal swab, presence of viral mutation(s) within the areas targeted by this assay, and inadequate number of viral copies(<138 copies/mL). A negative result must be combined with clinical observations, patient history, and epidemiological information. The expected result is Negative.  Fact Sheet for Patients:  BloggerCourse.comhttps://www.fda.gov/media/152166/download  Fact Sheet for Healthcare Providers:  SeriousBroker.ithttps://www.fda.gov/media/152162/download  This test is no t yet approved or cleared by the [place] FDA and  has been authorized for detection and/or diagnosis of SARS-CoV-2 by FDA under an Emergency Use Authorization (EUA). This EUA will remain  in effect (meaning this test can be used) for the duration of the COVID-19 declaration under Section 564(b)(1) of the Act, 21 U.S.C.section 360bbb-3(b)(1), unless the authorization is terminated  or revoked sooner.       Influenza A by PCR NEGATIVE NEGATIVE Final   Influenza B by PCR NEGATIVE NEGATIVE Final    Comment: (NOTE) The Xpert Xpress SARS-CoV-2/FLU/RSV plus assay is intended as an aid in the diagnosis of influenza from Nasopharyngeal swab specimens and should not be used as a sole basis for treatment. Nasal washings and aspirates are unacceptable for Xpert Xpress SARS-CoV-2/FLU/RSV testing.  Fact Sheet  for Patients: BloggerCourse.com  Fact Sheet for Healthcare Providers: SeriousBroker.it  This test is not yet approved or cleared by the [place] FDA and has been  authorized for detection and/or diagnosis of SARS-CoV-2 by FDA under an Emergency Use Authorization (EUA). This EUA will remain in effect (meaning this test can be used) for the duration of the COVID-19 declaration under Section 564(b)(1) of the Act, 21 U.S.C. section 360bbb-3(b)(1), unless the authorization is terminated or revoked.     Resp Syncytial Virus by PCR NEGATIVE NEGATIVE Final    Comment: (NOTE) Fact Sheet for Patients: BloggerCourse.com  Fact Sheet for Healthcare Providers: SeriousBroker.it  This test is not yet approved or cleared by the [place] FDA and has been authorized for detection and/or diagnosis of SARS-CoV-2 by FDA under an Emergency Use Authorization (EUA). This EUA will remain in effect (meaning this test can be used) for the duration of the COVID-19 declaration under Section 564(b)(1) of the Act, 21 U.S.C. section 360bbb-3(b)(1), unless the authorization is terminated or revoked.  Performed at [facility], [address] [place], [place] [address]   Blood Culture (routine x 2)     Status: None (Preliminary result)   Collection Time: 01/12/23  2:37 PM   Specimen: BLOOD RIGHT HAND  Result Value Ref Range Status   Specimen Description   Final    BLOOD RIGHT HAND BLOOD Performed at [facility], [address] [place], [place] [address]    Special Requests   Final    Blood Culture adequate volume BOTTLES DRAWN AEROBIC AND ANAEROBIC Performed at [facility], [address] [place], [place] [address]    Culture   Final    NO GROWTH 4 DAYS Performed at [facility], [address], [place], [place] [address]    Report Status PENDING  Incomplete  Urine Culture     Status: None   Collection Time: 01/12/23  4:43 PM   Specimen: In/Out Cath Urine  Result Value Ref Range Status   Specimen Description   Final    IN/OUT CATH URINE Performed at [facility], [address] [place], [place] [address]    Special Requests   Final    NONE Performed at [facility], [address] [place], [place] [address]    Culture   Final    NO GROWTH Performed at [facility], [address] [place], [place] [address]    Report Status 01/13/2023 FINAL  Final  MRSA Next Gen by PCR, Nasal     Status: None   Collection Time: 01/12/23 11:06 PM  Result Value Ref Range Status   MRSA by PCR Next Gen NOT DETECTED NOT DETECTED Final    Comment: (NOTE) The GeneXpert MRSA Assay (FDA approved for NASAL specimens only), is one component of a comprehensive MRSA colonization surveillance program. It is not intended to diagnose MRSA infection nor to guide or monitor treatment for MRSA infections. Test performance is not FDA approved in patients less than 12 years old. Performed at [facility], [address] [place], [place] [address]   Respiratory (~20 pathogens) panel by PCR     Status: None   Collection Time: 01/12/23 11:52 PM   Specimen: Nasopharyngeal Swab; Respiratory  Result Value Ref Range Status   Adenovirus NOT DETECTED NOT DETECTED Final   Coronavirus 229E NOT DETECTED NOT DETECTED Final    Comment: (  NOTE) The Coronavirus on the Respiratory Panel, DOES NOT test for the novel  Coronavirus (2019 nCoV)    Coronavirus HKU1 NOT DETECTED NOT DETECTED Final   Coronavirus NL63 NOT DETECTED NOT DETECTED Final   Coronavirus OC43 NOT DETECTED NOT DETECTED Final   Metapneumovirus NOT DETECTED NOT DETECTED Final   Rhinovirus / Enterovirus NOT DETECTED NOT DETECTED Final   Influenza A NOT DETECTED NOT DETECTED Final   Influenza B NOT DETECTED NOT DETECTED Final   Parainfluenza Virus 1 NOT DETECTED NOT DETECTED Final   Parainfluenza Virus 2 NOT DETECTED NOT DETECTED Final   Parainfluenza Virus 3 NOT DETECTED NOT DETECTED Final   Parainfluenza Virus 4 NOT DETECTED NOT DETECTED Final   Respiratory Syncytial Virus  NOT DETECTED NOT DETECTED Final   Bordetella pertussis NOT DETECTED NOT DETECTED Final   Bordetella Parapertussis NOT DETECTED NOT DETECTED Final   Chlamydophila pneumoniae NOT DETECTED NOT DETECTED Final   Mycoplasma pneumoniae NOT DETECTED NOT DETECTED Final    Comment: Performed at [facility], [address] [place], [place] [address]    Antimicrobials: Anti-infectives (From admission, onward)    Start     Dose/Rate Route Frequency Ordered Stop   01/13/23 1800  vancomycin (VANCOREADY) IVPB 1500 mg/300 mL  Status:  Discontinued        1,500 mg 150 mL/hr over 120 Minutes Intravenous Every 24 hours 01/13/23 1404 01/15/23 0817   01/13/23 0100  ceFEPIme (MAXIPIME) 2 g in sodium chloride 0.9 % 100 mL IVPB  Status:  Discontinued        2 g 200 mL/hr over 30 Minutes Intravenous Every 12 hours 01/13/23 0002 01/15/23 0837   01/13/23 0030  metroNIDAZOLE (FLAGYL) IVPB 500 mg  Status:  Discontinued        500 mg 100 mL/hr over 60 Minutes Intravenous Every 12 hours 01/12/23 2343 01/15/23 0837   01/12/23 1740  vancomycin variable dose per unstable renal function (pharmacist dosing)  Status:  Discontinued         Does not apply See admin instructions 01/12/23 1740 01/13/23 1406   01/12/23 1630  vancomycin (VANCOCIN) 500 mg in sodium chloride 0.9 % 100 mL IVPB       See Hyperspace for full Linked Orders Report.   500 mg 100 mL/hr over 60 Minutes Intravenous  Once 01/12/23 1523 01/12/23 1818   01/12/23 1530  vancomycin (VANCOCIN) IVPB 1000 mg/200 mL premix       See Hyperspace for full Linked Orders Report.   1,000 mg 200 mL/hr over 60 Minutes Intravenous  Once 01/12/23 1523 01/12/23 1652   01/12/23 1500  piperacillin-tazobactam (ZOSYN) IVPB 3.375 g        3.375 g 100 mL/hr over 30 Minutes Intravenous  Once 01/12/23 1450 01/12/23 1530      Culture/Microbiology    Component Value Date/Time   SDES  01/12/2023 1643    IN/OUT CATH URINE Performed at [facility], [address] [place], [place] [address]    [facility]  01/12/2023 1643    NONE Performed at [facility], [address] [place], [place] [address]    CULT  01/12/2023 1643    NO GROWTH Performed at [facility], [address], [place], [place] [address]    REPTSTATUS 01/13/2023 FINAL 01/12/2023 1643    Other culture-see note  Radiology Studies: US Abdomen Complete  Result Date: 01/15/2023 CLINICAL DATA:  Pancreatitis. EXAM: ABDOMEN ULTRASOUND COMPLETE COMPARISON:  None  Available. FINDINGS: Gallbladder: No gallstones or wall thickening visualized (2.4 mm). No sonographic Murphy sign noted by sonographer. Common bile duct: Not clearly visualized secondary to overlying bowel gas. Liver: No focal lesion identified. Diffusely increased echogenicity of the liver parenchyma is noted. Portal vein is patent on color Doppler imaging with normal direction of blood flow towards the liver. IVC: No abnormality visualized. Pancreas: Visualized portion unremarkable. Spleen: Size (9.1 cm) and appearance within normal limits. Right Kidney: Length: 10.2 cm. Echogenicity within normal limits. No mass or hydronephrosis visualized. Left Kidney: Length: 11.9 cm. Echogenicity within normal limits. A 2.2 cm x 1.9 cm x 2.0 cm simple cyst is seen within the left kidney. No hydronephrosis is visualized. Abdominal aorta: Not visualized. Other findings: Markedly limited study secondary to the patient's body habitus and overlying bowel gas. IMPRESSION: 1. Limited study secondary to the patient's body habitus and overlying bowel gas. 2. Hepatic steatosis. 3. Simple left renal cyst. Electronically Signed   By: Aram Candelahaddeus  Houston M.D.   On: 01/15/2023 21:32   Rapid EEG  Result Date: 01/15/2023 Charlsie QuestYadav, Priyanka O, MD     01/15/2023  3:11 PM Patient Name: Vincent Thompson MRN: [id number] Epilepsy Attending: Charlsie QuestPriyanka O Yadav Referring Physician/Provider: Lorin GlassSmith, Daniel C, MD Date: 01/15/2023 Duration: 38 mins Patient history:  57yo M with ams. EEG to evaluate for seizure Level of alertness: lethargic AEDs during EEG study: None Technical aspects: This EEG was obtained using a 10 lead EEG system positioned circumferentially without any parasagittal coverage (rapid EEG). Computer selected EEG is reviewed as  well as background features and all clinically significant events. Description: EEG showed continuous generalized 5-8hz  theta-alpha activity admixed with intermittent generalized 2-3hz  delta slowing. Hyperventilation and photic stimulation were not performed.   ABNORMALITY - Continuous slow, generalized IMPRESSION: This limited ceribell EEG is suggestive of moderate diffuse encephalopathy, nonspecific etiology. No seizures or epileptiform discharges were seen throughout the recording. If suspicion for interictal activity remains a concern, a conventional EEG can be considered. Charlsie QuestPriyanka O Yadav   US EKG SITE RITE  Result Date: 01/15/2023 [facility] image not attached, placement could not be confirmed due to current cardiac rhythm.  DG Abd 1 View  Result Date: 01/15/2023 CLINICAL DATA:  57 year old male with abdominal distension. EXAM: ABDOMEN - 1 VIEW COMPARISON:  CT Abdomen and Pelvis 01/12/2023. FINDINGS: Portable AP supine view at 0847 hours. Gas-filled but nondilated large and small bowel throughout the abdomen and pelvis. Gas in the rectum. Bowel gas has significantly increased from the recent CT. No pneumoperitoneum is evident on this supine view. Other abdominal and pelvic visceral contours appear negative. No acute osseous abnormality identified. IMPRESSION: Gas now throughout nondilated small and large bowel to the rectum. Pattern compatible with Ileus. Electronically Signed   By: Odessa FlemingH  Hall M.D.   On: 01/15/2023 08:53   DG CHEST PORT 1 VIEW  Result Date: 01/15/2023 CLINICAL DATA:  Tachypnea.  DKA and AK I. EXAM: PORTABLE CHEST 1 VIEW COMPARISON:  CT chest and chest x-ray dated January 12, 2023. FINDINGS: The heart  size and mediastinal contours are within normal limits. Continued low lung volumes. No focal consolidation, pleural effusion, or pneumothorax. No acute osseous abnormality. IMPRESSION: No active disease. Electronically Signed   By: Obie DredgeWilliam T Derry M.D.   On: 01/15/2023 08:36     LOS: 5 days   Lanae Boastamesh Jules Baty, MD [facility]  01/17/2023, 7:31 AM

## 2023-01-17 NOTE — Inpatient Diabetes Management (Addendum)
Inpatient Diabetes Program Recommendations  AACE/ADA: New Consensus Statement on Inpatient Glycemic Control (2015)  Target Ranges:  Prepandial:   less than 140 mg/dL      Peak postprandial:   less than 180 mg/dL (1-2 hours)      Critically ill patients:  140 - 180 mg/dL   Lab Results  Component Value Date   GLUCAP 175 (H) 01/17/2023   HGBA1C 13.7 (H) 01/13/2023    Review of Glycemic Control  Latest Reference Range & Units 01/17/23 02:51 01/17/23 08:33 01/17/23 10:31 01/17/23 12:39  Glucose-Capillary 70 - 99 mg/dL 133 (H) 158 (H) 175 (H) 159 (H)  (H): Data is abnormally high Admit with: DKA/ New Diagnosis Diabetes/ Acute Kidney Injury/ Early Pancreatitis   Home DM Meds: None Current Orders: IV Insulin Drip     Inpatient Diabetes Program Recommendations:     When ready to transition consider: -Semglee 45 units two hours prior to discontinuation then QD to follow -Novolog 0-15 units Q4H  Spoke with patient and called wife regarding new onset DM. Patient admits to having polydipsia and polyuria.  Reviewed patient's current A1c of 13.7%. Explained what a A1c is and what it measures. Also reviewed goal A1c with patient, importance of good glucose control @ home, and blood sugar goals. Reviewed patho of DKA, DM, role of pancreas, survival skills, vascular changes, interventions, and commorbidities.  Patient will need a meter prior to discharge and would be a great candidate for CGM. Patient is interested. Reviewed frequency for checking CBGs and when to call MD. Spouse to make follow up appointment.  Admits to drinking large amounts of juice due to excessive thirst. Reviewed alternatives with patient, encouraged increasing protein, plate method and CHo mindfulness. Dietitian consult placed. Educated patient and spouse on insulin pen use at home. Reviewed  contents of insulin flexpen starter kit. Reviewed all steps if insulin pen including attachment of needle, 2-unit air shot, dialing up  dose, giving injection, removing needle, disposal of sharps, storage of unused insulin, disposal of insulin etc. Patient able to provide successful return demonstration. Also reviewed troubleshooting with insulin pen. MD to give patient Rxs for insulin pens and insulin pen needles. Insulin starter kit ordered.  LWWDM booklet ordered. Wife wanting to think about outpatient education referral. Videos sent via cell.  Secure chat sent to Md.   Thanks, Bronson Curb, MSN, RNC-OB Diabetes Coordinator [phone] (8a-5p)

## 2023-01-18 ENCOUNTER — Inpatient Hospital Stay (HOSPITAL_COMMUNITY): Payer: 59

## 2023-01-18 ENCOUNTER — Other Ambulatory Visit (HOSPITAL_COMMUNITY): Payer: Self-pay

## 2023-01-18 DIAGNOSIS — E1110 Type 2 diabetes mellitus with ketoacidosis without coma: Secondary | ICD-10-CM | POA: Diagnosis not present

## 2023-01-18 LAB — CBC
HCT: 31.2 % — ABNORMAL LOW (ref 39.0–52.0)
Hemoglobin: 9.8 g/dL — ABNORMAL LOW (ref 13.0–17.0)
MCH: 32.1 pg (ref 26.0–34.0)
MCHC: 31.4 g/dL (ref 30.0–36.0)
MCV: 102.3 fL — ABNORMAL HIGH (ref 80.0–100.0)
Platelets: 149 10*3/uL — ABNORMAL LOW (ref 150–400)
RBC: 3.05 MIL/uL — ABNORMAL LOW (ref 4.22–5.81)
RDW: 12.8 % (ref 11.5–15.5)
WBC: 17.6 10*3/uL — ABNORMAL HIGH (ref 4.0–10.5)
nRBC: 0.3 % — ABNORMAL HIGH (ref 0.0–0.2)

## 2023-01-18 LAB — GLUCOSE, CAPILLARY
Glucose-Capillary: 175 mg/dL — ABNORMAL HIGH (ref 70–99)
Glucose-Capillary: 164 mg/dL — ABNORMAL HIGH (ref 70–99)
Glucose-Capillary: 173 mg/dL — ABNORMAL HIGH (ref 70–99)
Glucose-Capillary: 179 mg/dL — ABNORMAL HIGH (ref 70–99)
Glucose-Capillary: 166 mg/dL — ABNORMAL HIGH (ref 70–99)
Glucose-Capillary: 161 mg/dL — ABNORMAL HIGH (ref 70–99)
Glucose-Capillary: 158 mg/dL — ABNORMAL HIGH (ref 70–99)
Glucose-Capillary: 157 mg/dL — ABNORMAL HIGH (ref 70–99)
Glucose-Capillary: 148 mg/dL — ABNORMAL HIGH (ref 70–99)
Glucose-Capillary: 127 mg/dL — ABNORMAL HIGH (ref 70–99)
Glucose-Capillary: 141 mg/dL — ABNORMAL HIGH (ref 70–99)
Glucose-Capillary: 138 mg/dL — ABNORMAL HIGH (ref 70–99)
Glucose-Capillary: 142 mg/dL — ABNORMAL HIGH (ref 70–99)
Glucose-Capillary: 148 mg/dL — ABNORMAL HIGH (ref 70–99)
Glucose-Capillary: 134 mg/dL — ABNORMAL HIGH (ref 70–99)
Glucose-Capillary: 147 mg/dL — ABNORMAL HIGH (ref 70–99)
Glucose-Capillary: 127 mg/dL — ABNORMAL HIGH (ref 70–99)
Glucose-Capillary: 121 mg/dL — ABNORMAL HIGH (ref 70–99)
Glucose-Capillary: 132 mg/dL — ABNORMAL HIGH (ref 70–99)
Glucose-Capillary: 148 mg/dL — ABNORMAL HIGH (ref 70–99)
Glucose-Capillary: 143 mg/dL — ABNORMAL HIGH (ref 70–99)
Glucose-Capillary: 156 mg/dL — ABNORMAL HIGH (ref 70–99)
Glucose-Capillary: 93 mg/dL (ref 70–99)
Glucose-Capillary: 96 mg/dL (ref 70–99)
Glucose-Capillary: 139 mg/dL — ABNORMAL HIGH (ref 70–99)
Glucose-Capillary: 137 mg/dL — ABNORMAL HIGH (ref 70–99)
Glucose-Capillary: 137 mg/dL — ABNORMAL HIGH (ref 70–99)
Glucose-Capillary: 171 mg/dL — ABNORMAL HIGH (ref 70–99)
Glucose-Capillary: 171 mg/dL — ABNORMAL HIGH (ref 70–99)

## 2023-01-18 LAB — COMPREHENSIVE METABOLIC PANEL
ALT: 35 U/L (ref 0–44)
AST: 43 U/L — ABNORMAL HIGH (ref 15–41)
Albumin: 2.8 g/dL — ABNORMAL LOW (ref 3.5–5.0)
Alkaline Phosphatase: 52 U/L (ref 38–126)
Anion gap: 6 (ref 5–15)
BUN: 24 mg/dL — ABNORMAL HIGH (ref 6–20)
CO2: 23 mmol/L (ref 22–32)
Calcium: 8.2 mg/dL — ABNORMAL LOW (ref 8.9–10.3)
Chloride: 116 mmol/L — ABNORMAL HIGH (ref 98–111)
Creatinine, Ser: 0.92 mg/dL (ref 0.61–1.24)
GFR, Estimated: >60 mL/min (ref >60)
Glucose, Bld: 154 mg/dL — ABNORMAL HIGH (ref 70–99)
Potassium: 3.6 mmol/L (ref 3.5–5.1)
Sodium: 145 mmol/L (ref 135–145)
Total Bilirubin: 2.3 mg/dL — ABNORMAL HIGH (ref 0.3–1.2)
Total Protein: 5.9 g/dL — ABNORMAL LOW (ref 6.5–8.1)

## 2023-01-18 LAB — CK: Total CK: 309 U/L (ref 49–397)

## 2023-01-18 MED ORDER — IOHEXOL 9 MG/ML PO SOLN
500.0000 mL | ORAL | Status: AC
  Administered 2023-01-18: 500 mL via ORAL

## 2023-01-18 MED ORDER — IOHEXOL 300 MG/ML  SOLN
100.0000 mL | Freq: Once | INTRAMUSCULAR | Status: AC | PRN
  Administered 2023-01-18: 100 mL via INTRAVENOUS

## 2023-01-18 NOTE — Progress Notes (Signed)
[facility] Progress Note  SUBJECTIVE:   Interval history: Vincent Thompson was seen and evaluated today at bedside.  He was resting sitting in a chair.  Appears much more awake and alert.  Oriented to person place and time.  Noted that his abdomen continues to be distended, nonpainful.  No chest pain or shortness of breath.  Per discussion with nursing patient last bowel movement on 01/16/2023.  Past Medical History:  Diagnosis Date   Hyperlipemia    Hypertension    History reviewed. No pertinent surgical history. Current Facility-Administered Medications  Medication Dose Route Frequency Provider Last Rate Last Admin   Chlorhexidine Gluconate Cloth 2 % PADS 6 each  6 each Topical Daily Doutova, Anastassia, MD   6 each at 01/17/23 1105   dextrose 5 % in lactated ringers infusion   Intravenous Continuous Kc, Ramesh, MD 75 mL/hr at 01/18/23 1234 Infusion Verify at 01/18/23 1234   dextrose 50 % solution 0-50 mL  0-50 mL Intravenous PRN Therisa Doyne, MD       enoxaparin (LOVENOX) injection 40 mg  40 mg Subcutaneous Q24H Lorin Glass, MD   40 mg at 01/18/23 1126   folic acid (FOLVITE) tablet 1 mg  1 mg Per Tube Daily Green, Terri L, RPH       insulin regular, human (MYXREDLIN) 100 units/ 100 mL infusion   Intravenous Continuous Doutova, Anastassia, MD 2 mL/hr at 01/18/23 1234 2 Units/hr at 01/18/23 1234   insulin starter kit- pen needles (English) 1 kit  1 kit Other Once Kc, Dayna Barker, MD       iohexol (OMNIPAQUE) 9 MG/ML oral solution 500 mL  500 mL Oral Q1H Kc, Ramesh, MD   500 mL at 01/18/23 1040   LORazepam (ATIVAN) tablet 1-4 mg  1-4 mg Oral Q1H PRN Anthoney Harada, NP       Or   LORazepam (ATIVAN) injection 1-4 mg  1-4 mg Intravenous Q1H PRN Anthoney Harada, NP       multivitamin liquid 15 mL  15 mL Per Tube Daily Green, Terri L, RPH       ondansetron (ZOFRAN) injection 4 mg  4 mg Intravenous Q6H PRN Anthoney Harada, NP   4 mg at 01/17/23 0400   Oral care mouth rinse  15  mL Mouth Rinse PRN Doutova, Anastassia, MD       simethicone (MYLICON) 40 MG/0.6ML suspension 40 mg  40 mg Oral QID PRN Kc, Dayna Barker, MD   40 mg at 01/18/23 0133   sodium chloride flush (NS) 0.9 % injection 10-40 mL  10-40 mL Intracatheter Q12H Lorin Glass, MD   10 mL at 01/18/23 1015   sodium chloride flush (NS) 0.9 % injection 10-40 mL  10-40 mL Intracatheter PRN Lorin Glass, MD   10 mL at 01/17/23 1105   thiamine (VITAMIN B1) injection 100 mg  100 mg Intravenous Q8H Kc, Dayna Barker, MD   100 mg at 01/18/23 0500   Allergies as of 01/12/2023   (No Known Allergies)   Review of Systems:  Review of Systems  Respiratory:  Negative for shortness of breath.   Cardiovascular:  Negative for chest pain.  Gastrointestinal:  Negative for abdominal pain.       Abdominal distention    OBJECTIVE:   Temp:  [98 F (36.7 C)-98.7 F (37.1 C)] 98 F (36.7 C) (01/27 1155) Pulse Rate:  [86-101] 91 (01/27 1200) Resp:  [14-29] 20 (01/27 1200) BP: (99-147)/(71-94) 110/80 (01/27 1200) SpO2:  [93 %-98 %]  97 % (01/27 1200) Last BM Date : 01/16/23 Physical Exam Constitutional:      General: He is not in acute distress.    Appearance: He is not ill-appearing, toxic-appearing or diaphoretic.  Cardiovascular:     Rate and Rhythm: Normal rate and regular rhythm.  Pulmonary:     Effort: No respiratory distress.     Breath sounds: Normal breath sounds.     Comments: No supplemental oxygen in place Abdominal:     General: There is distension.     Palpations: Abdomen is soft.     Tenderness: There is no abdominal tenderness.  Neurological:     Mental Status: He is alert.     Labs: Recent Labs    01/16/23 0447 01/17/23 0431 01/18/23 0400  WBC 12.9* 16.0* 17.6*  HGB 13.2 11.4* 9.8*  HCT 41.3 35.5* 31.2*  PLT 129* 134* 149*   BMET Recent Labs    01/16/23 2040 01/17/23 0431 01/18/23 0400  NA 142 144 145  K 3.6 3.6 3.6  CL 115* 117* 116*  CO2 19* 21* 23  GLUCOSE 195* 164* 154*  BUN 27*  26* 24*  CREATININE 0.98 0.92 0.92  CALCIUM 8.0* 7.8* 8.2*   LFT Recent Labs    01/18/23 0400  PROT 5.9*  ALBUMIN 2.8*  AST 43*  ALT 35  ALKPHOS 52  BILITOT 2.3*   PT/INR No results for input(s): "LABPROT", "INR" in the last 72 hours. Diagnostic imaging: CT ABDOMEN PELVIS W CONTRAST  Result Date: 01/18/2023 CLINICAL DATA:  57 year old male with altered mental status, radiographic evidence of small-bowel obstruction yesterday. EXAM: CT ABDOMEN AND PELVIS WITH CONTRAST TECHNIQUE: Multidetector CT imaging of the abdomen and pelvis was performed using the standard protocol following bolus administration of intravenous contrast. RADIATION DOSE REDUCTION: This exam was performed according to the departmental dose-optimization program which includes automated exposure control, adjustment of the mA and/or kV according to patient size and/or use of iterative reconstruction technique. CONTRAST:  154mL OMNIPAQUE IOHEXOL 300 MG/ML  SOLN COMPARISON:  KUB yesterday. Noncontrast CT Chest, Abdomen, and Pelvis 01/12/2023. FINDINGS: Lower chest: New layering pleural effusions, small and greater on the left. Enhancing adjacent lower lobe atelectasis. No pericardial effusion. Partially visible SVC central line. Calcified coronary artery atherosclerosis. No cardiomegaly. Hepatobiliary: Fatty liver. Contracted gallbladder. No bile duct enlargement. Pancreas: Inflammation surrounding the tail of the pancreas does not appear significantly changed. No overt pancreatic necrosis. No ductal dilatation. Spleen: Pancreatic tail inflammation at the splenic hilum, otherwise negative. Adrenals/Urinary Tract: Negative adrenal glands. Symmetric renal enhancement. Simple density left lower pole cyst (no follow-up imaging recommended). On delayed images there is symmetric renal contrast excretion. Ureters are decompressed. Bladder is decompressed. Stomach/Bowel: Decompressed rectum. Highly redundant sigmoid colon which tracks into  the right upper quadrant. The sigmoid contains some fluid but is nondilated. Sigmoid diverticula. Fluid also in nondilated descending colon. Fluid in slightly larger transverse colon, hepatic flexure. Fluid in nondilated right colon. Appendix remains normal on series 2, image 69. Fluid in decompressed terminal ileum and multiple distal small bowel loops. Decompressed stomach. Fluid in the duodenum. Decompressed proximal jejunum, but dilated fluid-filled small bowel beginning in the left abdomen. Dilated small bowel air-fluid levels continuing to the level of a left lower quadrant transition point on series 5, image 59. However, there are mildly dilated fluid-filled loops distal to that transition also. And then a gradual transition then to the terminal ileum. No pneumoperitoneum. There is a small volume of simple density free fluid in the  bilateral lower quadrants, distal small bowel mesentery (coronal image 52 on the right). Vascular/Lymphatic: Major arterial structures in the abdomen and pelvis are patent. No calcified atherosclerosis or lymphadenopathy identified. Portal venous system including the splenic vein appear patent. Reproductive: Negative. Other: No pelvis free fluid.  Stable pelvic phleboliths. 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 No acute osseous abnormality identified. IMPRESSION: 1. Evidence of ongoing Acute Pancreatitis at the tail. No pancreatic necrosis, pseudocyst, or other direct complications. 2. Fluid-filled small and large bowel loops throughout the abdomen and pelvis compatible with enteritis/diarrhea. Plus abnormally dilated small bowel loops suggesting at least Partial Mechanical Small Bowel Obstruction with a transition point in the left lower quadrant (series 5, image 59). But also fluid distended loops distal to that transition, gradually tapering to the terminal ileum. 3. Small free fluid in both lower quadrants.  No pneumoperitoneum. 4. New small  layering pleural effusions with atelectasis since 01/12/2023, greater on the left. Electronically Signed   By: Genevie Ann M.D.   On: 01/18/2023 12:41   DG Abd Portable 1V  Result Date: 01/17/2023 CLINICAL DATA:  Abdominal distension. EXAM: PORTABLE ABDOMEN - 1 VIEW COMPARISON:  January 15, 2023. FINDINGS: Dilated small bowel loops are noted in the left side of the abdomen and pelvis concerning for distal small bowel obstruction or possibly ileus. No colonic dilatation is noted. No radio-opaque calculi or other significant radiographic abnormality are seen. IMPRESSION: Small bowel dilatation is noted concerning for distal small bowel obstruction or possibly ileus. Electronically Signed   By: Marijo Conception M.D.   On: 01/17/2023 13:41    IMPRESSION: Acute pancreatitis  -Ascribed to alcohol use  -Stable based on CT scan 01/18/2023 Partial small bowel obstruction  -Based on CT scan of abdomen and pelvis on 01/18/2023 Abdominal distention secondary to above Alcohol use Diabetic ketoacidosis, resolved Macrocytic anemia Leukocytosis Thrombocytopenia  PLAN: -Your ICU care for partial small bowel obstruction -Continue current management for acute pancreatitis, no abdominal pain, would use IV fluids in setting of bowel rest for SBO -Recommend strict alcohol cessation -Gastroenterology team will respectfully sign off and be available for any questions that arise   LOS: 6 days   Danton Clap, [facility]

## 2023-01-18 NOTE — Consult Note (Signed)
**Note Vincent-Identified via Obfuscation** Reason for Consult:pSBO Referring Provider: [name] is an 57 y.o. male.  HPI: 57 yo male admitted for DKA. He had vomiting 1 time yesterday. He had a normal bowel movement this morning. He denies nausea or vomiting currently. He is hungry currently.  Past Medical History:  Diagnosis Date   Hyperlipemia    Hypertension     History reviewed. No pertinent surgical history.  History reviewed. No pertinent family history.  Social History:  reports that he has never smoked. He has never used smokeless tobacco. He reports that he does not drink alcohol and does not use drugs.  Allergies: No Known Allergies  Medications: I have reviewed the patient's current medications.  Results for orders placed or performed during the hospital encounter of 01/12/23 (from the past 48 hour(s))  Glucose, capillary     Status: Abnormal   Collection Time: 01/16/23  4:16 PM  Result Value Ref Range   Glucose-Capillary 159 (H) 70 - 99 mg/dL    Comment: Glucose reference range applies only to samples taken after fasting for at least 8 hours.  Glucose, capillary     Status: Abnormal   Collection Time: 01/16/23  7:26 PM  Result Value Ref Range   Glucose-Capillary 192 (H) 70 - 99 mg/dL    Comment: Glucose reference range applies only to samples taken after fasting for at least 8 hours.   Comment 1 Notify RN    Comment 2 Document in Chart   Basic metabolic panel     Status: Abnormal   Collection Time: 01/16/23  8:40 PM  Result Value Ref Range   Sodium 142 135 - 145 mmol/L   Potassium 3.6 3.5 - 5.1 mmol/L   Chloride 115 (H) 98 - 111 mmol/L   CO2 19 (L) 22 - 32 mmol/L   Glucose, Bld 195 (H) 70 - 99 mg/dL    Comment: Glucose reference range applies only to samples taken after fasting for at least 8 hours.   BUN 27 (H) 6 - 20 mg/dL   Creatinine, Ser 0.98 0.61 - 1.24 mg/dL   Calcium 8.0 (L) 8.9 - 10.3 mg/dL   GFR, Estimated >60 >60 mL/min    Comment: (NOTE) Calculated using the  CKD-EPI Creatinine Equation (2021)    Anion gap 8 5 - 15    Comment: Performed at [facility], [place] [address] [place], [place] [address]  Glucose, capillary     Status: Abnormal   Collection Time: 01/17/23 12:44 AM  Result Value Ref Range   Glucose-Capillary 151 (H) 70 - 99 mg/dL    Comment: Glucose reference range applies only to samples taken after fasting for at least 8 hours.   Comment 1 Notify RN    Comment 2 Document in Chart   Glucose, capillary     Status: Abnormal   Collection Time: 01/17/23  2:51 AM  Result Value Ref Range   Glucose-Capillary 133 (H) 70 - 99 mg/dL    Comment: Glucose reference range applies only to samples taken after fasting for at least 8 hours.   Comment 1 Notify RN    Comment 2 Document in Chart   CBC     Status: Abnormal   Collection Time: 01/17/23  4:31 AM  Result Value Ref Range   WBC 16.0 (H) 4.0 - 10.5 K/uL   RBC 3.57 (L) 4.22 - 5.81 MIL/uL   Hemoglobin 11.4 (L) 13.0 - 17.0 g/dL   HCT 35.5 (L) 39.0 - 52.0 %  MCV 99.4 80.0 - 100.0 fL   MCH 31.9 26.0 - 34.0 pg   MCHC 32.1 30.0 - 36.0 g/dL   RDW 12.5 11.5 - 15.5 %   Platelets 134 (L) 150 - 400 K/uL   nRBC 0.2 0.0 - 0.2 %    Comment: Performed at [facility], [place] [address], [place], [place] [address]  Comprehensive metabolic panel     Status: Abnormal   Collection Time: 01/17/23  4:31 AM  Result Value Ref Range   Sodium 144 135 - 145 mmol/L   Potassium 3.6 3.5 - 5.1 mmol/L   Chloride 117 (H) 98 - 111 mmol/L   CO2 21 (L) 22 - 32 mmol/L   Glucose, Bld 164 (H) 70 - 99 mg/dL    Comment: Glucose reference range applies only to samples taken after fasting for at least 8 hours.   BUN 26 (H) 6 - 20 mg/dL   Creatinine, Ser 0.92 0.61 - 1.24 mg/dL   Calcium 7.8 (L) 8.9 - 10.3 mg/dL   Total Protein 6.1 (L) 6.5 - 8.1 g/dL   Albumin 2.7 (L) 3.5 - 5.0 g/dL   AST 45 (H) 15 - 41 U/L   ALT 35 0 - 44 U/L   Alkaline Phosphatase 55 38 - 126 U/L   Total Bilirubin  2.0 (H) 0.3 - 1.2 mg/dL   GFR, Estimated >60 >60 mL/min    Comment: (NOTE) Calculated using the CKD-EPI Creatinine Equation (2021)    Anion gap 6 5 - 15    Comment: Performed at [facility], [place] [address], [place], [place] [address]  CK     Status: Abnormal   Collection Time: 01/17/23  4:31 AM  Result Value Ref Range   Total CK 531 (H) 49 - 397 U/L    Comment: Performed at [facility], [place] [address] [place], [place] [address]  Beta-hydroxybutyric acid     Status: Abnormal   Collection Time: 01/17/23  4:31 AM  Result Value Ref Range   Beta-Hydroxybutyric Acid 0.64 (H) 0.05 - 0.27 mmol/L    Comment: Performed at [facility], [place] [address], [place], [place] [address]  Glucose, capillary     Status: Abnormal   Collection Time: 01/17/23  8:33 AM  Result Value Ref Range   Glucose-Capillary 158 (H) 70 - 99 mg/dL    Comment: Glucose reference range applies only to samples taken after fasting for at least 8 hours.   Comment 1 Notify RN    Comment 2 Document in Chart   Glucose, capillary     Status: Abnormal   Collection Time: 01/17/23 10:31 AM  Result Value Ref Range   Glucose-Capillary 175 (H) 70 - 99 mg/dL    Comment: Glucose reference range applies only to samples taken after fasting for at least 8 hours.   Comment 1 Notify RN    Comment 2 Document in Chart   Glucose, capillary     Status: Abnormal   Collection Time: 01/17/23 12:39 PM  Result Value Ref Range   Glucose-Capillary 159 (H) 70 - 99 mg/dL    Comment: Glucose reference range applies only to samples taken after fasting for at least 8 hours.   Comment 1 Notify RN    Comment 2 Document in Chart   Glucose, capillary     Status: Abnormal   Collection Time: 01/17/23  4:53 PM  Result Value Ref Range   Glucose-Capillary 144 (H) 70 - 99 mg/dL  Comment: Glucose reference range applies only to samples taken after fasting for at least 8 hours.   Comment 1 Notify RN     Comment 2 Document in Chart   Glucose, capillary     Status: Abnormal   Collection Time: 01/17/23  6:51 PM  Result Value Ref Range   Glucose-Capillary 160 (H) 70 - 99 mg/dL    Comment: Glucose reference range applies only to samples taken after fasting for at least 8 hours.   Comment 1 Notify RN    Comment 2 Document in Chart   Glucose, capillary     Status: Abnormal   Collection Time: 01/17/23  8:58 PM  Result Value Ref Range   Glucose-Capillary 141 (H) 70 - 99 mg/dL    Comment: Glucose reference range applies only to samples taken after fasting for at least 8 hours.  Glucose, capillary     Status: Abnormal   Collection Time: 01/17/23 11:07 PM  Result Value Ref Range   Glucose-Capillary 152 (H) 70 - 99 mg/dL    Comment: Glucose reference range applies only to samples taken after fasting for at least 8 hours.  Glucose, capillary     Status: Abnormal   Collection Time: 01/18/23  1:31 AM  Result Value Ref Range   Glucose-Capillary 127 (H) 70 - 99 mg/dL    Comment: Glucose reference range applies only to samples taken after fasting for at least 8 hours.  Glucose, capillary     Status: Abnormal   Collection Time: 01/18/23  2:51 AM  Result Value Ref Range   Glucose-Capillary 141 (H) 70 - 99 mg/dL    Comment: Glucose reference range applies only to samples taken after fasting for at least 8 hours.  Glucose, capillary     Status: Abnormal   Collection Time: 01/18/23  3:52 AM  Result Value Ref Range   Glucose-Capillary 138 (H) 70 - 99 mg/dL    Comment: Glucose reference range applies only to samples taken after fasting for at least 8 hours.  CBC     Status: Abnormal   Collection Time: 01/18/23  4:00 AM  Result Value Ref Range   WBC 17.6 (H) 4.0 - 10.5 K/uL   RBC 3.05 (L) 4.22 - 5.81 MIL/uL   Hemoglobin 9.8 (L) 13.0 - 17.0 g/dL   HCT 16.1 (L) 09.6 - 04.5 %   MCV 102.3 (H) 80.0 - 100.0 fL   MCH 32.1 26.0 - 34.0 pg   MCHC 31.4 30.0 - 36.0 g/dL   RDW 40.9 81.1 - 91.4 %   Platelets  149 (L) 150 - 400 K/uL   nRBC 0.3 (H) 0.0 - 0.2 %    Comment: Performed at [facility], [address] [place], [place] [address]  Comprehensive metabolic panel     Status: Abnormal   Collection Time: 01/18/23  4:00 AM  Result Value Ref Range   Sodium 145 135 - 145 mmol/L   Potassium 3.6 3.5 - 5.1 mmol/L   Chloride 116 (H) 98 - 111 mmol/L   CO2 23 22 - 32 mmol/L   Glucose, Bld 154 (H) 70 - 99 mg/dL    Comment: Glucose reference range applies only to samples taken after fasting for at least 8 hours.   BUN 24 (H) 6 - 20 mg/dL   Creatinine, Ser 6.21 0.61 - 1.24 mg/dL   Calcium 8.2 (L) 8.9 - 10.3 mg/dL   Total Protein 5.9 (L) 6.5 - 8.1 g/dL   Albumin 2.8 (L) 3.5 -  5.0 g/dL   AST 43 (H) 15 - 41 U/L   ALT 35 0 - 44 U/L   Alkaline Phosphatase 52 38 - 126 U/L   Total Bilirubin 2.3 (H) 0.3 - 1.2 mg/dL   GFR, Estimated >91>60 >47>60 mL/min    Comment: (NOTE) Calculated using the CKD-EPI Creatinine Equation (2021)    Anion gap 6 5 - 15    Comment: Performed at [facility], [address] [place], [place] [address]  CK     Status: None   Collection Time: 01/18/23  4:00 AM  Result Value Ref Range   Total CK 309 49 - 397 U/L    Comment: Performed at [facility], [address] [place], [place] [id number]  Glucose, capillary     Status: Abnormal   Collection Time: 01/18/23  4:59 AM  Result Value Ref Range   Glucose-Capillary 142 (H) 70 - 99 mg/dL    Comment: Glucose reference range applies only to samples taken after fasting for at least 8 hours.  Glucose, capillary     Status: Abnormal   Collection Time: 01/18/23  6:30 AM  Result Value Ref Range   Glucose-Capillary 148 (H) 70 - 99 mg/dL    Comment: Glucose reference range applies only to samples taken after fasting for at least 8 hours.  Glucose, capillary     Status: Abnormal   Collection Time: 01/18/23  7:39 AM  Result Value Ref Range   Glucose-Capillary 134 (H) 70 - 99 mg/dL     Comment: Glucose reference range applies only to samples taken after fasting for at least 8 hours.   Comment 1 Notify RN    Comment 2 Document in Chart   Glucose, capillary     Status: Abnormal   Collection Time: 01/18/23  8:40 AM  Result Value Ref Range   Glucose-Capillary 147 (H) 70 - 99 mg/dL    Comment: Glucose reference range applies only to samples taken after fasting for at least 8 hours.   Comment 1 Notify RN    Comment 2 Document in Chart   Glucose, capillary     Status: Abnormal   Collection Time: 01/18/23  9:53 AM  Result Value Ref Range   Glucose-Capillary 127 (H) 70 - 99 mg/dL    Comment: Glucose reference range applies only to samples taken after fasting for at least 8 hours.   Comment 1 Notify RN    Comment 2 Document in Chart   Glucose, capillary     Status: Abnormal   Collection Time: 01/18/23 10:53 AM  Result Value Ref Range   Glucose-Capillary 121 (H) 70 - 99 mg/dL    Comment: Glucose reference range applies only to samples taken after fasting for at least 8 hours.   Comment 1 Notify RN    Comment 2 Document in Chart   Glucose, capillary     Status: Abnormal   Collection Time: 01/18/23 11:21 AM  Result Value Ref Range   Glucose-Capillary 132 (H) 70 - 99 mg/dL    Comment: Glucose reference range applies only to samples taken after fasting for at least 8 hours.   Comment 1 Notify RN    Comment 2 Document in Chart   Glucose, capillary     Status: Abnormal   Collection Time: 01/18/23 12:36 PM  Result Value Ref Range   Glucose-Capillary 148 (H) 70 - 99 mg/dL    Comment: Glucose reference range applies only to samples taken after fasting for at  least 8 hours.   Comment 1 Notify RN    Comment 2 Document in Chart   Glucose, capillary     Status: Abnormal   Collection Time: 01/18/23  1:30 PM  Result Value Ref Range   Glucose-Capillary 143 (H) 70 - 99 mg/dL    Comment: Glucose reference range applies only to samples taken after fasting for at least 8 hours.    Comment 1 Notify RN    Comment 2 Document in Chart   Glucose, capillary     Status: Abnormal   Collection Time: 01/18/23  2:22 PM  Result Value Ref Range   Glucose-Capillary 156 (H) 70 - 99 mg/dL    Comment: Glucose reference range applies only to samples taken after fasting for at least 8 hours.    CT ABDOMEN PELVIS W CONTRAST  Result Date: 01/18/2023 CLINICAL DATA:  57 year old male with altered mental status, radiographic evidence of small-bowel obstruction yesterday. EXAM: CT ABDOMEN AND PELVIS WITH CONTRAST TECHNIQUE: Multidetector CT imaging of the abdomen and pelvis was performed using the standard protocol following bolus administration of intravenous contrast. RADIATION DOSE REDUCTION: This exam was performed according to the departmental dose-optimization program which includes automated exposure control, adjustment of the mA and/or kV according to patient size and/or use of iterative reconstruction technique. CONTRAST:  OMNIPAQUE IOHEXOL 300 MG/ML  SOLN COMPARISON:  KUB yesterday. Noncontrast CT Chest, Abdomen, and Pelvis 01/12/2023. FINDINGS: Lower chest: New layering pleural effusions, small and greater on the left. Enhancing adjacent lower lobe atelectasis. No pericardial effusion. Partially visible SVC central line. Calcified coronary artery atherosclerosis. No cardiomegaly. Hepatobiliary: Fatty liver. Contracted gallbladder. No bile duct enlargement. Pancreas: Inflammation surrounding the tail of the pancreas does not appear significantly changed. No overt pancreatic necrosis. No ductal dilatation. Spleen: Pancreatic tail inflammation at the splenic hilum, otherwise negative. Adrenals/Urinary Tract: Negative adrenal glands. Symmetric renal enhancement. Simple density left lower pole cyst (no follow-up imaging recommended). On delayed images there is symmetric renal contrast excretion. Ureters are decompressed. Bladder is decompressed. Stomach/Bowel: Decompressed rectum. Highly  redundant sigmoid colon which tracks into the right upper quadrant. The sigmoid contains some fluid but is nondilated. Sigmoid diverticula. Fluid also in nondilated descending colon. Fluid in slightly larger transverse colon, hepatic flexure. Fluid in nondilated right colon. Appendix remains normal on series 2, image 69. Fluid in decompressed terminal ileum and multiple distal small bowel loops. Decompressed stomach. Fluid in the duodenum. Decompressed proximal jejunum, but dilated fluid-filled small bowel beginning in the left abdomen. Dilated small bowel air-fluid levels continuing to the level of a left lower quadrant transition point on series 5, image 59. However, there are mildly dilated fluid-filled loops distal to that transition also. And then a gradual transition then to the terminal ileum. No pneumoperitoneum. There is a small volume of simple density free fluid in the bilateral lower quadrants, distal small bowel mesentery (coronal image 52 on the right). Vascular/Lymphatic: Major arterial structures in the abdomen and pelvis are patent. No calcified atherosclerosis or lymphadenopathy identified. Portal venous system including the splenic vein appear patent. Reproductive: Negative. Other: No pelvis free fluid.  Stable pelvic phleboliths. 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 No acute osseous abnormality identified. IMPRESSION: 1. Evidence of ongoing Acute Pancreatitis at the tail. No pancreatic necrosis, pseudocyst, or other direct complications. 2. Fluid-filled small and large bowel loops throughout the abdomen and pelvis compatible with enteritis/diarrhea. Plus abnormally dilated small bowel loops suggesting at least Partial Mechanical Small Bowel Obstruction with a transition point  in the left lower quadrant (series 5, image 59). But also fluid distended loops distal to that transition, gradually tapering to the terminal ileum. 3. Small free fluid in both lower  quadrants.  No pneumoperitoneum. 4. New small layering pleural effusions with atelectasis since 01/12/2023, greater on the left. Electronically Signed   By: Odessa Fleming M.D.   On: 01/18/2023 12:41   DG Abd Portable 1V  Result Date: 01/17/2023 CLINICAL DATA:  Abdominal distension. EXAM: PORTABLE ABDOMEN - 1 VIEW COMPARISON:  January 15, 2023. FINDINGS: Dilated small bowel loops are noted in the left side of the abdomen and pelvis concerning for distal small bowel obstruction or possibly ileus. No colonic dilatation is noted. No radio-opaque calculi or other significant radiographic abnormality are seen. IMPRESSION: Small bowel dilatation is noted concerning for distal small bowel obstruction or possibly ileus. Electronically Signed   By: Lupita Raider M.D.   On: 01/17/2023 13:41    Review of Systems  Constitutional: Negative.   HENT: Negative.    Eyes: Negative.   Respiratory: Negative.    Cardiovascular: Negative.   Gastrointestinal: Negative.   Genitourinary: Negative.   Musculoskeletal: Negative.   Skin: Negative.   Neurological: Negative.   Endo/Heme/Allergies: Negative.   Psychiatric/Behavioral: Negative.      PE Blood pressure 110/80, pulse 91, temperature 98 F (36.7 C), temperature source Oral, resp. rate 20, height 5' 9.5" (1.765 m), weight 97.5 kg, SpO2 97 %. Constitutional: NAD; conversant; no deformities Eyes: Moist conjunctiva; no lid lag; anicteric; PERRL Neck: Trachea midline; no thyromegaly Lungs: Normal respiratory effort; no tactile fremitus CV: RRR; no palpable thrills; no pitting edema GI: Abd soft, firm; no palpable hepatosplenomegaly MSK: Normal gait; no clubbing/cyanosis Psychiatric: Appropriate affect; alert and oriented x3 Lymphatic: No palpable cervical or axillary lymphadenopathy Skin: No major subcutaneous nodules. Warm and dry   Assessment/Plan: 57 yo male admitted for DKA with resolving ileus. I reviewed the CT scan (1/27) images showing fluid filled  small intestine and fluid within the colon.  He has no symptoms concerning for bowel obstruction. I recommend diet advancement as tolerated as is appropriate with DKA protocols.  I reviewed last 24 h vitals and pain scores, last 48 h intake and output, last 24 h labs and trends, and last 24 h imaging results.  This care required moderate level of medical decision making.   Vincent Thompson 01/18/2023, 3:30 PM

## 2023-01-18 NOTE — Progress Notes (Signed)
PROGRESS NOTE Vincent Thompson  YWV:[id number] DOB: [birth year] DOA: 01/12/2023 PCP: Loyal Jacobson, MD   Brief Narrative/Hospital Course: 57 year old male with hypertension elevated LFTs anxiety hyperlipidemia who has been having cough and upper respiratory symptoms for 2 and half weeks-had tested negative for COVID a week ago, presented with lethargy/confusion, tired. In the ED low-grade fever 100.2 tachycardic in the 120s, stable BP and oxygen saturation Labs obtained that showed blood sugar more than 1200, hyperkalemia, AKI w/ creatinine 3.6, transaminitis lactic acidosis 5.9 leukocytosis 18.7 BHB more than 8 COVID-19 influenza negative, lipase elevated 1194,CK4 39 troponin 44.  Normal folate and elevated B12, iron 39 but ferritin high at 2484. Imaging: Chest x-ray low lung volumes, CT chest abdomen abdomen pelvis w/o: Mild stranding/edema of pancreas concerning for pancreatitis, likely reactive/secondary duodenitis, hepatic steatosis, 2.6 cm left renal lesion. RVP panel pending Patient was aggressively volume resuscitated with 2 L bolus, was given vancomycin and Zosyn, placed on IV insulin drip, cefepime Flagyl and admitted for further management Critical care Dr Merrily Pew was consulted but advised stable for [facility] admission Due to severe electrolyte imbalance nephrology was consulted Patient managed aggressive normal saline along with D5W insulin drip phosphorus and potassium replacement.  Electrolytes had significantly improved but patient remains confused with pancreatitis distended abdomen x-ray 1/24 showed ileus.  Seen by critical care. 1/25: Much less confused, passing gas and had bowel movement.   Subjective: Patient seen and examined this morning. He is up walked around the bedside chair passing flatus no BM.  Burping but no nausea or vomiting, abdominal distention still present but feels little bit better Patient is afebrile saturating well on room air, heart rate respiration rate and blood  pressure fairly stable Labs reviewed blood sugar well-controlled, stable CMP CK improved but having worsening of leukocytosis   Assessment and Plan: Principal Problem:   DKA (diabetic ketoacidosis) (HCC) Active Problems:   AKI (acute kidney injury) (HCC)   Pancreatitis   Hypercalcemia   Elevated LFTs   SIRS (systemic inflammatory response syndrome) (HCC)   Acute metabolic encephalopathy   Ileus (HCC)   Hypernatremia  T2DM-new onset w/ hx of prediabetes w/ uncontrolled hba1c at 13 w/ uncontrolled hyperglycemia >1200  DKA on presentation-resolve now: Remains on insulin drip, D5 LR as he is n.p.o., diabetes control following.  Blood sugar is well-controlled.  Once able to take p.o. we will transition to basal bolus insulin regimen continue to follow Endotool. Recent Labs  Lab 01/13/23 0028 01/13/23 0036 01/18/23 0739 01/18/23 0840 01/18/23 0953 01/18/23 1053 01/18/23 1121  GLUCAP  --    < > 134* 147* 127* 121* 132*  HGBA1C 13.7*  --   --   --   --   --   --    < > = values in this interval not displayed.   Ileus with abdominal distention nausea and vomiting: Patient having abdominal distention since 1/24 x-ray showing ileus, he has had bowel movement 1/25, passing flatus. Abdomen remains distended. Xray 1/26-showing small bowel dilatation concerning for distal small bowel obstruction or possibly ileus discussed with surgery PA Kelly> advised to continue management of ileus as per GI, given persistent/worsening Leukocytosis, ongoing abdominal distention> discussed with GI will obtain CT abdomen pelvis with IV contrast.Continue current bowel rest IV hydration antiemetics.   Pancreatitis lipase 166>1012>147:CT chest abdomen pelvis in ED showed pancreatitis-likely EtOH induced.TG level normal.  Managed conservatively GI following- Ultrasound 1/24 hepatic steatosis no other acute finding.  History of alcohol use 3-5 drinks per week Alcoholic hepatitis/abnormal  LFTs/possible alcoholic  fatty liver disease: Suspect he had withdrawal with delerium.Continue with CIWA scale Ativan, thiamine folate/multivitamin.  Ethanol level negative drug screen negative.  Cessation advised  Acute metabolic encephalopathy in the setting of patient's DKA/SIRS possible alcohol withdrawal delirium:Did not need Precedex appreciate PCCM input continue CIWA Ativan, Thiamine.  Ammonia slightly elevated.mental status has remarkably improved. continue current supportive care delirium precaution   AKI: 2/2 volume depletion with DKA> resolved.nephrology signed off, Renal ultrasound no hydronephrosis borderline mild renal enlargement   Severe dehydration with DKA with free water deficit Hypernatremia:resolved Patient's sodium was in normal range which is false in the setting of hyperglycemia with blood sugar more than 1200.Needed significant volume resuscitation with normal saline along with free water D5W.  Stopped  NS and D5W 1/25> on gentle RL D5W w/ insulin drip and for ileus     Hypophosphatemia:In the setting of DKA/insulin [id number]. Hypercalcemia-in the setting of severe dehydration: PTH appropriately suppressed, ca normal. Hypokalemia: resolved keep >4. Monitor electrolytes. Recent Labs  Lab 01/13/23 0028 01/13/23 0253 01/13/23 0959 01/13/23 1140 01/13/23 1354 01/13/23 2029 01/14/23 0259 01/14/23 1520 01/14/23 1551 01/15/23 0051 01/15/23 0439 01/15/23 0838 01/15/23 2050 01/16/23 0009 01/16/23 0447 01/16/23 1238 01/16/23 2040 01/17/23 0431 01/18/23 0400  K 4.0 3.7   < > 3.3*   < > 5.3* 2.9*   < >  --    < > 5.0   < > 3.2*   < > 3.7 3.5 3.6 3.6 3.6  CALCIUM 12.4* 12.4*   < > 12.1*   < > 10.2 9.6   < >  --    < > 8.0*   < > 7.7*   < > 7.7* 8.5* 8.0* 7.8* 8.2*  MG 3.7* 3.5*  --   --   --   --  1.9  --   --   --   --   --  1.5*  --   --   --   --   --   --   PHOS 3.0 1.9*  --  1.1*  --  5.9* 1.2*  --  2.9  --  2.7  --   --   --   --   --   --   --   --    < > = values in this  interval not displayed.   Vitamin D Deficiency at 19.5-pending calcitriol level- start high dose repalcement once taking po.   Rhabdomyolysis: Nontraumatic,CK improving nicely on IV fluids. Recent Labs  Lab 01/13/23 0031 01/14/23 0259 01/15/23 0439 01/16/23 0447 01/17/23 0431 01/18/23 0400  CKTOTAL 439* 2,005* 1,269* 746* 531* 309   Elevated troponin: Patient without chest pain likely demand ischemia in the setting of severe electrolyte imbalance.  Trops flat  SIRS 2/2 pancreatitis Leucocytosis Lactic acidosis: +procal 3.7>Cxr,CT chest on pelvis no obvious infection noted UA WBC 0-5. Suspect this is due to patient's dehydration/dka/pancreatitis> blood culture negative so far> off antibiotics 1/24.  WBC remains elevated with somewhat worsening.  Recent Labs  Lab 01/12/23 1423 01/12/23 1602 01/12/23 1932 01/13/23 0003 01/13/23 0031 01/13/23 0959 01/14/23 0259 01/15/23 0439 01/16/23 0447 01/17/23 0431 01/18/23 0400  WBC 18.7*  --   --   --   --  19.8* 16.3* 12.8* 12.9* 16.0* 17.6*  LATICACIDVEN 5.9* 6.8* 2.5* 2.3*  --  1.6  --   --   --   --   --   PROCALCITON  --   --   --   --  3.79  --   --   --   --   --   --    Weight gain on admission 180 pounds>: But reports his baseline weight is around 200 pounds now at 215 1/26: Will consider IV Lasix once ileus better.  Currently no shortness of breath and no leg edema.  Monitor urine output, daily weight Net IO Since Admission: 14,726.51 mL [01/18/23 1151]  Filed Weights   01/15/23 0414 01/16/23 0403 01/17/23 0408  Weight: 92.4 kg 97.7 kg 97.5 kg     DVT prophylaxis: enoxaparin (LOVENOX) injection 40 mg Start: 01/15/23 1200 SCDs Start: 01/12/23 2346 Code Status:   Code Status: Full Code Family Communication: plan of care discussed with patient/no family at bedside I had updated patient's wife at the bedside previously.  Family were updated at bedside 1/25 evening  Patient status is: Inpatient because of DKA/severe  electrolyte derangement and encephalopathy Level of care: Stepdown  Dispo: The patient is from:Home            Anticipated disposition:TBD Objective: Vitals last 24 hrs: Vitals:   01/18/23 0700 01/18/23 0800 01/18/23 1000 01/18/23 1100  BP: 121/79 (!) 131/94 125/83 119/76  Pulse: 88 90 87 93  Resp: 15 (!) 21 (!) 29 17  Temp:  98 F (36.7 C)    TempSrc:  Oral    SpO2: 97% 97% 98% 97%  Weight:      Height:       Weight change:   Physical Examination: General exam: alert awake oriented, older than stated age HEENT:Oral mucosa moist, Ear/Nose WNL grossly Respiratory system: Bilaterally clear BS, no use of accessory muscle Cardiovascular system: S1 & S2 +, No JVD. Gastrointestinal system: Abdomen soft, distended abdomen nontender bowel sounds present/sluggish  Nervous System: Alert, awake, moving extremities, non focal and follows commands. Extremities: LE edema neg,distal peripheral pulses palpable.  Skin: No rashes,no icterus. MSK: Normal muscle bulk,tone, power   Medications reviewed:  Scheduled Meds:  Chlorhexidine Gluconate Cloth  6 each Topical Daily   enoxaparin (LOVENOX) injection  40 mg Subcutaneous S96G   folic acid  1 mg Per Tube Daily   insulin starter kit- pen needles  1 kit Other Once   iohexol  500 mL Oral Q1H   multivitamin  15 mL Per Tube Daily   sodium chloride flush  10-40 mL Intracatheter Q12H   thiamine (VITAMIN B1) injection  100 mg Intravenous Q8H   Continuous Infusions:  dextrose 5% lactated ringers 75 mL/hr at 01/18/23 0600   insulin 1.5 Units/hr (01/18/23 1006)    Diet Order             Diet NPO time specified  Diet effective now                   Intake/Output Summary (Last 24 hours) at 01/18/2023 1151 Last data filed at 01/18/2023 0900 Gross per 24 hour  Intake 1736.81 ml  Output 1100 ml  Net 636.81 ml   Net IO Since Admission: 14,726.51 mL [01/18/23 1151]  Wt Readings from Last 3 Encounters:  01/17/23 97.5 kg  07/31/20 88.5 kg   09/27/19 88.5 kg     Unresulted Labs (From admission, onward)     Start     Ordered   01/15/23 0500  CBC  Daily,   R     Question:  Specimen collection method  Answer:  Lab=Lab collect   01/14/23 0747   01/15/23 0500  Comprehensive metabolic panel  Daily,  R     Question:  Specimen collection method  Answer:  Lab=Lab collect   01/14/23 0853   01/15/23 0500  CK  Daily,   R     Question:  Specimen collection method  Answer:  Lab=Lab collect   01/14/23 1245   01/13/23 0500  CBC with Differential  Tomorrow morning,   R        01/12/23 2343          Data Reviewed: I have personally reviewed following labs and imaging studies CBC: Recent Labs  Lab 01/12/23 1423 01/12/23 1434 01/13/23 0959 01/14/23 0259 01/15/23 0439 01/16/23 0447 01/17/23 0431 01/18/23 0400  WBC 18.7*  --  19.8* 16.3* 12.8* 12.9* 16.0* 17.6*  NEUTROABS 16.8*  --  17.9*  --   --   --   --   --   HGB 17.0   < > 16.2 14.0 12.6* 13.2 11.4* 9.8*  HCT 53.4*   < > 48.6 44.0 39.7 41.3 35.5* 31.2*  MCV 99.1  --  94.2 98.9 99.7 98.8 99.4 102.3*  PLT 325  --  238 159 100* 129* 134* 149*   < > = values in this interval not displayed.   Basic Metabolic Panel: Recent Labs  Lab 01/13/23 0028 01/13/23 0253 01/13/23 0959 01/13/23 1140 01/13/23 1354 01/13/23 2029 01/14/23 0259 01/14/23 1520 01/14/23 1551 01/15/23 0051 01/15/23 0439 01/15/23 [id number] 01/15/23 2050 01/16/23 0009 01/16/23 0447 01/16/23 1238 01/16/23 2040 01/17/23 0431 01/18/23 0400  NA 151* 155*   < > 163*   < > 159* 157*   < >  --    < > 145   < > 143   < > 143 141 142 144 145  K 4.0 3.7   < > 3.3*   < > 5.3* 2.9*   < >  --    < > 5.0   < > 3.2*   < > 3.7 3.5 3.6 3.6 3.6  CL 110 118*   < > 126*   < > 126* 128*   < >  --    < > 119*   < > 118*   < > 118* 115* 115* 117* 116*  CO2 25 26   < > 27   < > 24 20*   < >  --    < > 19*   < > 19*   < > 19* 16* 19* 21* 23  GLUCOSE 726* 567*   < > 277*   < > 237* 302*   < >  --    < > 159*   < > 179*   <  > 152* 198* 195* 164* 154*  BUN 66* 64*   < > 52*   < > 43* 38*   < >  --    < > 20   < > 19   < > 18 25* 27* 26* 24*  CREATININE 2.27* 2.04*   < > 1.53*   < > 1.43* 1.46*   < >  --    < > 1.01   < > 0.99   < > 0.80 0.94 0.98 0.92 0.92  CALCIUM 12.4* 12.4*   < > 12.1*   < > 10.2 9.6   < >  --    < > 8.0*   < > 7.7*   < > 7.7* 8.5* 8.0* 7.8* 8.2*  MG 3.7* 3.5*  --   --   --   --  1.9  --   --   --   --   --  1.5*  --   --   --   --   --   --   PHOS 3.0 1.9*  --  1.1*  --  5.9* 1.2*  --  2.9  --  2.7  --   --   --   --   --   --   --   --    < > = values in this interval not displayed.   GFR: Estimated Creatinine Clearance: 102.9 mL/min (by C-G formula based on SCr of 0.92 mg/dL). Liver Function Tests: Recent Labs  Lab 01/14/23 0259 01/15/23 0439 01/16/23 0447 01/17/23 0431 01/18/23 0400  AST 89* 74* 47* 45* 43*  ALT 52* 42 37 35 35  ALKPHOS 47 45 50 55 52  BILITOT 0.6 1.0 1.4* 2.0* 2.3*  PROT 6.3* 6.1* 6.0* 6.1* 5.9*  ALBUMIN 2.9* 2.6* 2.6* 2.7* 2.8*   Recent Labs  Lab 01/12/23 1600 01/13/23 0031 01/13/23 0253 01/15/23 0439  LIPASE 1,194* 166* 1,012* 147*   Recent Labs  Lab 01/13/23 0003 01/15/23 1001 01/16/23 0447  AMMONIA 33 54* 18   Coagulation Profile: Recent Labs  Lab 01/12/23 1423  INR 1.3*   No results for input(s): "TSH", "T4TOTAL", "FREET4", "T3FREE", "THYROIDAB" in the last 72 hours.  Sepsis Labs: Recent Labs  Lab 01/12/23 1602 01/12/23 1932 01/13/23 0003 01/13/23 0031 01/13/23 0959  PROCALCITON  --   --   --  3.79  --   LATICACIDVEN 6.8* 2.5* 2.3*  --  1.6    Recent Results (from the past 240 hour(s))  Blood Culture (routine x 2)     Status: None   Collection Time: 01/12/23  2:23 PM   Specimen: Left Antecubital; Blood  Result Value Ref Range Status   Specimen Description   Final    LEFT ANTECUBITAL BLOOD Performed at [facility], [address] [place], [place] [address]    Special Requests   Final    Blood Culture adequate  volume BOTTLES DRAWN AEROBIC AND ANAEROBIC Performed at [facility], [address], [place], [place] [address]    Culture   Final    NO GROWTH 5 DAYS Performed at [facility], [place] [address], [place], [place] [address]    Report Status 01/17/2023 FINAL  Final  Resp panel by RT-PCR (RSV, Flu A&B, Covid) Anterior Nasal Swab     Status: None   Collection Time: 01/12/23  2:30 PM   Specimen: Anterior Nasal Swab  Result Value Ref Range Status   SARS Coronavirus 2 by RT PCR NEGATIVE NEGATIVE Final    Comment: (NOTE) SARS-CoV-2 target nucleic acids are NOT DETECTED.  The SARS-CoV-2 RNA is generally detectable in upper respiratory specimens during the acute phase of infection. The lowest concentration of SARS-CoV-2 viral copies this assay can detect is 138 copies/mL. A negative result does not preclude SARS-Cov-2 infection and should not be used as the sole basis for treatment or other patient management decisions. A negative result may occur with  improper specimen collection/handling, submission of specimen other than nasopharyngeal swab, presence of viral mutation(s) within the areas targeted by this assay, and inadequate number of viral copies(<138 copies/mL). A negative result must be combined with clinical observations, patient history, and epidemiological information. The expected result is Negative.  Fact Sheet for Patients:  EntrepreneurPulse.com.au  Fact Sheet for Healthcare Providers:  IncredibleEmployment.be  This test is no t yet approved or cleared by the [place] and  has been authorized for detection and/or diagnosis of SARS-CoV-2 by FDA under an Emergency Use Authorization (EUA). This EUA will remain  in effect (meaning this test can be used) for the duration of the COVID-19 declaration under Section 564(b)(1) of the Act, 21 U.S.C.section 360bbb-3(b)(1), unless the authorization is terminated  or revoked  sooner.       Influenza A by PCR NEGATIVE NEGATIVE Final   Influenza B by PCR NEGATIVE NEGATIVE Final    Comment: (NOTE) The Xpert Xpress SARS-CoV-2/FLU/RSV plus assay is intended as an aid in the diagnosis of influenza from Nasopharyngeal swab specimens and should not be used as a sole basis for treatment. Nasal washings and aspirates are unacceptable for Xpert Xpress SARS-CoV-2/FLU/RSV testing.  Fact Sheet for Patients: EntrepreneurPulse.com.au  Fact Sheet for Healthcare Providers: IncredibleEmployment.be  This test is not yet approved or cleared by the [place] FDA and has been authorized for detection and/or diagnosis of SARS-CoV-2 by FDA under an Emergency Use Authorization (EUA). This EUA will remain in effect (meaning this test can be used) for the duration of the COVID-19 declaration under Section 564(b)(1) of the Act, 21 U.S.C. section 360bbb-3(b)(1), unless the authorization is terminated or revoked.     Resp Syncytial Virus by PCR NEGATIVE NEGATIVE Final    Comment: (NOTE) Fact Sheet for Patients: EntrepreneurPulse.com.au  Fact Sheet for Healthcare Providers: IncredibleEmployment.be  This test is not yet approved or cleared by the [place] FDA and has been authorized for detection and/or diagnosis of SARS-CoV-2 by FDA under an Emergency Use Authorization (EUA). This EUA will remain in effect (meaning this test can be used) for the duration of the COVID-19 declaration under Section 564(b)(1) of the Act, 21 U.S.C. section 360bbb-3(b)(1), unless the authorization is terminated or revoked.  Performed at [facility], [address] [place], [place] [address]   Blood Culture (routine x 2)     Status: None   Collection Time: 01/12/23  2:37 PM   Specimen: BLOOD RIGHT HAND  Result Value Ref Range Status   Specimen Description   Final    BLOOD RIGHT HAND BLOOD Performed  at [facility], [address] [place], [place] [address]    Special Requests   Final    Blood Culture adequate volume BOTTLES DRAWN AEROBIC AND ANAEROBIC Performed at [facility], [address], [place], [place] [address]    Culture   Final    NO GROWTH 5 DAYS Performed at [facility], [place] [address] [place], [place] [address]    Report Status 01/17/2023 FINAL  Final  Urine Culture     Status: None   Collection Time: 01/12/23  4:43 PM   Specimen: In/Out Cath Urine  Result Value Ref Range Status   Specimen Description   Final    IN/OUT CATH URINE Performed at [facility], [address] [place], [place] [address]    Special Requests   Final    NONE Performed at [facility], [address] [place], [place] [address]    Culture   Final    NO GROWTH Performed at [facility], [place] [address] [place], [place] [address]    Report Status 01/13/2023 FINAL  Final  MRSA Next Gen by PCR, Nasal     Status: None   Collection Time: 01/12/23 11:06 PM  Result Value Ref Range Status   MRSA by PCR Next Gen NOT  DETECTED NOT DETECTED Final    Comment: (NOTE) The GeneXpert MRSA Assay (FDA approved for NASAL specimens only), is one component of a comprehensive MRSA colonization surveillance program. It is not intended to diagnose MRSA infection nor to guide or monitor treatment for MRSA infections. Test performance is not FDA approved in patients less than 58 years old. Performed at [facility], [place] [address], [place], [place] [address]   Respiratory (~20 pathogens) panel by PCR     Status: None   Collection Time: 01/12/23 11:52 PM   Specimen: Nasopharyngeal Swab; Respiratory  Result Value Ref Range Status   Adenovirus NOT DETECTED NOT DETECTED Final   Coronavirus 229E NOT DETECTED NOT DETECTED Final    Comment: (NOTE) The Coronavirus on the Respiratory Panel, DOES NOT test for the novel  Coronavirus  (2019 nCoV)    Coronavirus HKU1 NOT DETECTED NOT DETECTED Final   Coronavirus NL63 NOT DETECTED NOT DETECTED Final   Coronavirus OC43 NOT DETECTED NOT DETECTED Final   Metapneumovirus NOT DETECTED NOT DETECTED Final   Rhinovirus / Enterovirus NOT DETECTED NOT DETECTED Final   Influenza A NOT DETECTED NOT DETECTED Final   Influenza B NOT DETECTED NOT DETECTED Final   Parainfluenza Virus 1 NOT DETECTED NOT DETECTED Final   Parainfluenza Virus 2 NOT DETECTED NOT DETECTED Final   Parainfluenza Virus 3 NOT DETECTED NOT DETECTED Final   Parainfluenza Virus 4 NOT DETECTED NOT DETECTED Final   Respiratory Syncytial Virus NOT DETECTED NOT DETECTED Final   Bordetella pertussis NOT DETECTED NOT DETECTED Final   Bordetella Parapertussis NOT DETECTED NOT DETECTED Final   Chlamydophila pneumoniae NOT DETECTED NOT DETECTED Final   Mycoplasma pneumoniae NOT DETECTED NOT DETECTED Final    Comment: Performed at [facility], [place]. [address] [place], [place] [address]  Antimicrobials: Anti-infectives (From admission, onward)    Start     Dose/Rate Route Frequency Ordered Stop   01/13/23 1800  vancomycin (VANCOREADY) IVPB 1500 mg/300 mL  Status:  Discontinued        1,500 mg 150 mL/hr over 120 Minutes Intravenous Every 24 hours 01/13/23 1404 01/15/23 0817   01/13/23 0100  ceFEPIme (MAXIPIME) 2 g in sodium chloride 0.9 % 100 mL IVPB  Status:  Discontinued        2 g 200 mL/hr over 30 Minutes Intravenous Every 12 hours 01/13/23 0002 01/15/23 0837   01/13/23 0030  metroNIDAZOLE (FLAGYL) IVPB 500 mg  Status:  Discontinued        500 mg 100 mL/hr over 60 Minutes Intravenous Every 12 hours 01/12/23 2343 01/15/23 0837   01/12/23 1740  vancomycin variable dose per unstable renal function (pharmacist dosing)  Status:  Discontinued         Does not apply See admin instructions 01/12/23 1740 01/13/23 1406   01/12/23 1630  vancomycin (VANCOCIN) 500 mg in sodium chloride 0.9 % 100 mL IVPB       See  Hyperspace for full Linked Orders Report.   500 mg 100 mL/hr over 60 Minutes Intravenous  Once 01/12/23 1523 01/12/23 1818   01/12/23 1530  vancomycin (VANCOCIN) IVPB 1000 mg/200 mL premix       See Hyperspace for full Linked Orders Report.   1,000 mg 200 mL/hr over 60 Minutes Intravenous  Once 01/12/23 1523 01/12/23 1652   01/12/23 1500  piperacillin-tazobactam (ZOSYN) IVPB 3.375 g        3.375 g 100 mL/hr over 30 Minutes Intravenous  Once 01/12/23 1450 01/12/23  1530      Culture/Microbiology    Component Value Date/Time   SDES  01/12/2023 1643    IN/OUT CATH URINE Performed at [facility], [address] [place], [place] [address]    [facility]  01/12/2023 1643    NONE Performed at [facility], [address] [place], [place] [address]    CULT  01/12/2023 1643    NO GROWTH Performed at [facility], [place] [address], [place], [place] [address]    REPTSTATUS 01/13/2023 FINAL 01/12/2023 1643  Other culture-see note  Radiology Studies: DG Abd Portable 1V  Result Date: 01/17/2023 CLINICAL DATA:  Abdominal distension. EXAM: PORTABLE ABDOMEN - 1 VIEW COMPARISON:  January 15, 2023. FINDINGS: Dilated small bowel loops are noted in the left side of the abdomen and pelvis concerning for distal small bowel obstruction or possibly ileus. No colonic dilatation is noted. No radio-opaque calculi or other significant radiographic abnormality are seen. IMPRESSION: Small bowel dilatation is noted concerning for distal small bowel obstruction or possibly ileus. Electronically Signed   By: Marijo Conception M.D.   On: 01/17/2023 13:41     LOS: 6 days   Antonieta Pert, MD [facility]  01/18/2023, 11:51 AM

## 2023-01-19 DIAGNOSIS — K567 Ileus, unspecified: Secondary | ICD-10-CM

## 2023-01-19 LAB — COMPREHENSIVE METABOLIC PANEL
ALT: 32 U/L (ref 0–44)
AST: 35 U/L (ref 15–41)
Albumin: 2.5 g/dL — ABNORMAL LOW (ref 3.5–5.0)
Alkaline Phosphatase: 50 U/L (ref 38–126)
Anion gap: 5 (ref 5–15)
BUN: 21 mg/dL — ABNORMAL HIGH (ref 6–20)
CO2: 23 mmol/L (ref 22–32)
Calcium: 7.7 mg/dL — ABNORMAL LOW (ref 8.9–10.3)
Chloride: 117 mmol/L — ABNORMAL HIGH (ref 98–111)
Creatinine, Ser: 0.94 mg/dL (ref 0.61–1.24)
GFR, Estimated: >60 mL/min (ref >60)
Glucose, Bld: 207 mg/dL — ABNORMAL HIGH (ref 70–99)
Potassium: 3.4 mmol/L — ABNORMAL LOW (ref 3.5–5.1)
Sodium: 145 mmol/L (ref 135–145)
Total Bilirubin: 1.3 mg/dL — ABNORMAL HIGH (ref 0.3–1.2)
Total Protein: 5.3 g/dL — ABNORMAL LOW (ref 6.5–8.1)

## 2023-01-19 LAB — GLUCOSE, CAPILLARY
Glucose-Capillary: 161 mg/dL — ABNORMAL HIGH (ref 70–99)
Glucose-Capillary: 163 mg/dL — ABNORMAL HIGH (ref 70–99)
Glucose-Capillary: 164 mg/dL — ABNORMAL HIGH (ref 70–99)
Glucose-Capillary: 166 mg/dL — ABNORMAL HIGH (ref 70–99)
Glucose-Capillary: 168 mg/dL — ABNORMAL HIGH (ref 70–99)
Glucose-Capillary: 169 mg/dL — ABNORMAL HIGH (ref 70–99)
Glucose-Capillary: 173 mg/dL — ABNORMAL HIGH (ref 70–99)
Glucose-Capillary: 178 mg/dL — ABNORMAL HIGH (ref 70–99)
Glucose-Capillary: 184 mg/dL — ABNORMAL HIGH (ref 70–99)
Glucose-Capillary: 212 mg/dL — ABNORMAL HIGH (ref 70–99)
Glucose-Capillary: 212 mg/dL — ABNORMAL HIGH (ref 70–99)
Glucose-Capillary: 228 mg/dL — ABNORMAL HIGH (ref 70–99)
Glucose-Capillary: 235 mg/dL — ABNORMAL HIGH (ref 70–99)

## 2023-01-19 LAB — CBC
HCT: 27 % — ABNORMAL LOW (ref 39.0–52.0)
Hemoglobin: 8.2 g/dL — ABNORMAL LOW (ref 13.0–17.0)
MCH: 31.5 pg (ref 26.0–34.0)
MCHC: 30.4 g/dL (ref 30.0–36.0)
MCV: 103.8 fL — ABNORMAL HIGH (ref 80.0–100.0)
Platelets: 142 10*3/uL — ABNORMAL LOW (ref 150–400)
RBC: 2.6 MIL/uL — ABNORMAL LOW (ref 4.22–5.81)
RDW: 12.8 % (ref 11.5–15.5)
WBC: 11.2 10*3/uL — ABNORMAL HIGH (ref 4.0–10.5)
nRBC: 0.6 % — ABNORMAL HIGH (ref 0.0–0.2)

## 2023-01-19 MED ORDER — FOLIC ACID 1 MG PO TABS
1.0000 mg | ORAL_TABLET | Freq: Every day | ORAL | Status: DC
  Administered 2023-01-19 – 2023-01-22 (×4): 1 mg via ORAL
  Filled 2023-01-19 (×4): qty 1

## 2023-01-19 MED ORDER — ADULT MULTIVITAMIN W/MINERALS CH
1.0000 | ORAL_TABLET | Freq: Every day | ORAL | Status: DC
  Administered 2023-01-19 – 2023-01-22 (×4): 1 via ORAL
  Filled 2023-01-19 (×4): qty 1

## 2023-01-19 MED ORDER — INSULIN DETEMIR 100 UNIT/ML ~~LOC~~ SOLN
30.0000 [IU] | Freq: Every day | SUBCUTANEOUS | Status: DC
  Administered 2023-01-19 – 2023-01-20 (×2): 30 [IU] via SUBCUTANEOUS
  Filled 2023-01-19 (×2): qty 0.3

## 2023-01-19 MED ORDER — POTASSIUM CHLORIDE 10 MEQ/100ML IV SOLN
10.0000 meq | INTRAVENOUS | Status: AC
  Administered 2023-01-19 (×2): 10 meq via INTRAVENOUS
  Filled 2023-01-19 (×2): qty 100

## 2023-01-19 MED ORDER — INSULIN ASPART 100 UNIT/ML IJ SOLN
0.0000 [IU] | Freq: Every day | INTRAMUSCULAR | Status: DC
  Administered 2023-01-19: 2 [IU] via SUBCUTANEOUS

## 2023-01-19 MED ORDER — POTASSIUM CHLORIDE CRYS ER 20 MEQ PO TBCR
40.0000 meq | EXTENDED_RELEASE_TABLET | Freq: Once | ORAL | Status: AC
  Administered 2023-01-19: 40 meq via ORAL
  Filled 2023-01-19: qty 2

## 2023-01-19 MED ORDER — INSULIN ASPART 100 UNIT/ML IJ SOLN
0.0000 [IU] | Freq: Three times a day (TID) | INTRAMUSCULAR | Status: DC
  Administered 2023-01-19 – 2023-01-20 (×2): 5 [IU] via SUBCUTANEOUS
  Administered 2023-01-20: 2 [IU] via SUBCUTANEOUS

## 2023-01-19 MED ORDER — THIAMINE MONONITRATE 100 MG PO TABS
100.0000 mg | ORAL_TABLET | Freq: Every day | ORAL | Status: DC
  Administered 2023-01-21 – 2023-01-22 (×2): 100 mg via ORAL
  Filled 2023-01-19 (×2): qty 1

## 2023-01-19 NOTE — Progress Notes (Signed)
PROGRESS NOTE KONSTANTINOS CORDOBA  QMV:[id number] DOB: [birth year] DOA: 01/12/2023 PCP: [name], MD   Brief Narrative/Hospital Course: 57 year old male with hypertension elevated LFTs anxiety hyperlipidemia who has been having cough and upper respiratory symptoms for 2 and half weeks-had tested negative for COVID a week ago, presented with lethargy/confusion, tired. In the ED low-grade fever 100.2 tachycardic in the 120s, stable BP and oxygen saturation Labs obtained that showed blood sugar more than 1200, hyperkalemia, AKI w/ creatinine 3.6, transaminitis lactic acidosis 5.9 leukocytosis 18.7 BHB more than 8 COVID-19 influenza negative, lipase elevated 1194,CK4 39 troponin 44.  Normal folate and elevated B12, iron 39 but ferritin high at 2484. Imaging: Chest x-ray low lung volumes, CT chest abdomen abdomen pelvis w/o: Mild stranding/edema of pancreas concerning for pancreatitis, likely reactive/secondary duodenitis, hepatic steatosis, 2.6 cm left renal lesion. RVP panel pending Patient was aggressively volume resuscitated with 2 L bolus, was given vancomycin and Zosyn, placed on IV insulin drip, cefepime Flagyl and admitted for further management Critical care Dr Tacy Learn was consulted but advised stable for [facility] admission Due to severe electrolyte imbalance nephrology was consulted Patient managed aggressive normal saline along with D5W insulin drip phosphorus and potassium replacement.  Electrolytes had significantly improved but patient remains confused with pancreatitis distended abdomen x-ray 1/24 showed ileus.  Seen by critical care. 1/25: Much less confused, passing gas and had bowel movement. 1/27: Ongoing persistent leukocytosis abdominal distention, CT abdomen obtained with IV contrast showing fluid-filled small at this time and also in the colon seen by [name]- no concern for bowel obstruction advised to advance diet   Subjective: Seen and examined this morning Ordering his  breakfast Had 2BMs yesterday, passing flatus, no Nausea or Vomiting.  Abdomen is less distended from before. Overnight afebrile, saturating well on room air vitals overall stable Labs shows improved leukocytosis hemoglobin 8.2 g mild thrombocytopenia, mild hypokalemia blood sugar <200  Assessment and Plan: Principal Problem:   DKA (diabetic ketoacidosis) ([place]) Active Problems:   AKI (acute kidney injury) ([place])   Pancreatitis   Hypercalcemia   Elevated LFTs   SIRS (systemic inflammatory response syndrome) (H[facility])   Acute metabolic encephalopathy   Ileus ([facility])   Hypernatremia  T2DM-new onset w/ hx of prediabetes w/ uncontrolled hba1c at 13 w/ uncontrolled hyperglycemia >1200  DKA on presentation-resolve now: Remains on insulin drip, D5 LR as he is n.p.o., diabetes coordinator following.Blood sugar is well-controlled on insulin drip/Endo tool.As well advancing diet hopefully we can switch to basal bolus regimen soon. Recent Labs  Lab 01/13/23 0028 01/13/23 0036 01/18/23 2359 01/19/23 0205 01/19/23 0405 01/19/23 0643 01/19/23 0753  GLUCAP  --    < > 169* 166* 178* 184* 168*  HGBA1C 13.7*  --   --   --   --   --   --    < > = values in this interval not displayed.   Ileus with abdominal distention nausea and vomiting: Patient having abdominal distention since 1/24 x-ray showing ileus. 1/27: Ongoing persistent leukocytosis abdominal distention, CT abdomen obtained with IV contrast showing fluid-filled small at this time and also in the colon seen by [name]- no concern for bowel obstruction advised to advance diet.  Had a bowel movement, no nausea vomiting, on CLD- adat diet. Appreciate GI input for ongoing management of ileus.  Acute Pancreatitis:lipase 166>1012>147:CT chest abdomen pelvis in ED and repeat 1/27 ongoing acute pancreatitis no necrosis pseudocyst or other complication.likely EtOH induced.TG level normal.Ultrasound 1/24 hepatic steatosis no other acute  finding. Continue to manage as per GI with conservative management  History of alcohol use 3-5 drinks per week Alcoholic hepatitis/abnormal LFTs/possible alcoholic fatty liver disease: Suspect he had withdrawal with delerium.managed with CIWA scale Ativan, thiamine folate/multivitamin.  Ethanol level negative drug screen negative.  Continue to encourage cessation  Acute metabolic encephalopathy in the setting of patient's DKA/SIRS possible alcohol withdrawal delirium: Mental status improved.  He S/P high-dose thiamine.  Continue supportive care delirium precaution fall precaution  AKI: 2/2 volume depletion with DKA> resolved.Nephrology signed off, Renal ultrasound stable  Severe dehydration with DKA with free water deficit: Resolved with aggressive IV fluid hydration Hypernatremia:resolved w/ ivf.  Nephro signed off. On gentle RL D5W w/ insulin drip and for ileus Hypophosphatemia:In the setting of DKA/insulin KYH:CWCBJSEG. Hypercalcemia-in the setting of severe dehydration: PTH appropriately suppressed, ca normal. Hypokalemia: K slightly low replete to keep >4. Monitor electrolytes. Recent Labs  Lab 01/13/23 0028 01/13/23 0253 01/13/23 0959 01/13/23 1140 01/13/23 1354 01/13/23 2029 01/14/23 0259 01/14/23 1520 01/14/23 1551 01/15/23 0051 01/15/23 0439 01/15/23 0838 01/15/23 2050 01/16/23 0009 01/16/23 1238 01/16/23 2040 01/17/23 0431 01/18/23 0400 01/19/23 0415  K 4.0 3.7   < > 3.3*   < > 5.3* 2.9*   < >  --    < > 5.0   < > 3.2*   < > 3.5 3.6 3.6 3.6 3.4*  CALCIUM 12.4* 12.4*   < > 12.1*   < > 10.2 9.6   < >  --    < > 8.0*   < > 7.7*   < > 8.5* 8.0* 7.8* 8.2* 7.7*  MG 3.7* 3.5*  --   --   --   --  1.9  --   --   --   --   --  1.5*  --   --   --   --   --   --   PHOS 3.0 1.9*  --  1.1*  --  5.9* 1.2*  --  2.9  --  2.7  --   --   --   --   --   --   --   --    < > = values in this interval not displayed.   Vitamin D Deficiency at 19.5-pending calcitriol level- start high  dose repalcement once taking po. Rhabdomyolysis: Nontraumatic,CK improved Elevated troponin: Patient without chest pain likely demand ischemia in the setting of severe electrolyte imbalance.  Trops flat  SIRS 2/2 pancreatitis Leucocytosis Lactic acidosis: +procal 3.7>Cxr,CT chest on pelvis no obvious infection noted UA WBC 0-5. Suspect this is due to patient's dehydration/dka/pancreatitis> blood culture negative so far> off antibiotics 1/24.  WBC now improving, overall remains afebrile Recent Labs  Lab 01/12/23 1423 01/12/23 1602 01/12/23 1932 01/13/23 0003 01/13/23 0031 01/13/23 0959 01/14/23 0259 01/15/23 0439 01/16/23 0447 01/17/23 0431 01/18/23 0400 01/19/23 0415  WBC 18.7*  --   --   --   --  19.8*   < > 12.8* 12.9* 16.0* 17.6* 11.2*  LATICACIDVEN 5.9* 6.8* 2.5* 2.3*  --  1.6  --   --   --   --   --   --   PROCALCITON  --   --   --   --  3.79  --   --   --   --   --   --   --    < > = values in this interval not displayed.   Weight gain on admission 180 pounds>: But  reports his baseline weight is around 200 pounds now at 215 1/26- see below: Will consider IV Lasix once ileus better. Currently no shortness of breath and no leg edema.  Monitor urine output, daily weight Net IO Since Admission: 15,833.57 mL [01/19/23 0822]  Filed Weights   01/15/23 0414 01/16/23 0403 01/17/23 0408  Weight: 92.4 kg 97.7 kg 97.5 kg     DVT prophylaxis: enoxaparin (LOVENOX) injection 40 mg Start: 01/15/23 1200 SCDs Start: 01/12/23 2346 Code Status:   Code Status: Full Code Family Communication: plan of care discussed with patient/no family at bedside I had updated patient's wife at the bedside previously.  Family were updated at bedside 1/25 evening  Patient status is: Inpatient because of DKA/severe electrolyte derangement and encephalopathy Level of care: Stepdown  Dispo: The patient is from:Home            Anticipated disposition:TBD Objective: Vitals last 24 hrs: Vitals:   01/19/23  0600 01/19/23 0700 01/19/23 0758 01/19/23 0800  BP: 131/86 126/84  130/87  Pulse: 76     Resp: 11 15  (!) 24  Temp:   98.2 F (36.8 C)   TempSrc:   Oral   SpO2: 98% 98%  99%  Weight:      Height:       Weight change:   Physical Examination: General exam: alert awake oriented, pleasant HEENT:Oral mucosa moist, Ear/Nose WNL grossly Respiratory system: Bilaterally clear BS, no use of accessory muscle Cardiovascular system: S1 & S2 +, No JVD. Gastrointestinal system: Abdomen soft, some distention, nontender, BS+ Nervous System: Alert, awake, moving extremities, hefollows commands. Extremities: LE edema neg,distal peripheral pulses palpable.  Skin: No rashes,no icterus. MSK: Normal muscle bulk,tone, power  Medications reviewed:  Scheduled Meds:  Chlorhexidine Gluconate Cloth  6 each Topical Daily   enoxaparin (LOVENOX) injection  40 mg Subcutaneous Q24H   folic acid  1 mg Per Tube Daily   insulin starter kit- pen needles  1 kit Other Once   multivitamin  15 mL Per Tube Daily   potassium chloride  40 mEq Oral Once   sodium chloride flush  10-40 mL Intracatheter Q12H   thiamine (VITAMIN B1) injection  100 mg Intravenous Q8H   Continuous Infusions:  dextrose 5% lactated ringers 75 mL/hr at 01/19/23 0819   insulin 1.5 Units/hr (01/19/23 0819)   potassium chloride      Diet Order             Diet clear liquid Room service appropriate? Yes; Fluid consistency: Thin  Diet effective now                   Intake/Output Summary (Last 24 hours) at 01/19/2023 0822 Last data filed at 01/19/2023 0819 Gross per 24 hour  Intake 2007.06 ml  Output 1025 ml  Net 982.06 ml   Net IO Since Admission: 15,833.57 mL [01/19/23 0822]  Wt Readings from Last 3 Encounters:  01/17/23 97.5 kg  07/31/20 88.5 kg  09/27/19 88.5 kg     Unresulted Labs (From admission, onward)     Start     Ordered   01/13/23 0500  CBC with Differential  Tomorrow morning,   R        01/12/23 2343           Data Reviewed: I have personally reviewed following labs and imaging studies CBC: Recent Labs  Lab 01/12/23 1423 01/12/23 1434 01/13/23 0959 01/14/23 0259 01/15/23 0439 01/16/23 0447 01/17/23 0431 01/18/23 0400 01/19/23 8546  WBC 18.7*  --  19.8*   < > 12.8* 12.9* 16.0* 17.6* 11.2*  NEUTROABS 16.8*  --  17.9*  --   --   --   --   --   --   HGB 17.0   < > 16.2   < > 12.6* 13.2 11.4* 9.8* 8.2*  HCT 53.4*   < > 48.6   < > 39.7 41.3 35.5* 31.2* 27.0*  MCV 99.1  --  94.2   < > 99.7 98.8 99.4 102.3* 103.8*  PLT 325  --  238   < > 100* 129* 134* 149* 142*   < > = values in this interval not displayed.   Basic Metabolic Panel: Recent Labs  Lab 01/13/23 0028 01/13/23 0253 01/13/23 0959 01/13/23 1140 01/13/23 1354 01/13/23 2029 01/14/23 0259 01/14/23 1520 01/14/23 1551 01/15/23 0051 01/15/23 0439 01/15/23 4098 01/15/23 2050 01/16/23 0009 01/16/23 1238 01/16/23 2040 01/17/23 0431 01/18/23 0400 01/19/23 0415  NA 151* 155*   < > 163*   < > 159* 157*   < >  --    < > 145   < > 143   < > 141 142 144 145 145  K 4.0 3.7   < > 3.3*   < > 5.3* 2.9*   < >  --    < > 5.0   < > 3.2*   < > 3.5 3.6 3.6 3.6 3.4*  CL 110 118*   < > 126*   < > 126* 128*   < >  --    < > 119*   < > 118*   < > 115* 115* 117* 116* 117*  CO2 25 26   < > 27   < > 24 20*   < >  --    < > 19*   < > 19*   < > 16* 19* 21* 23 23  GLUCOSE 726* 567*   < > 277*   < > 237* 302*   < >  --    < > 159*   < > 179*   < > 198* 195* 164* 154* 207*  BUN 66* 64*   < > 52*   < > 43* 38*   < >  --    < > 20   < > 19   < > 25* 27* 26* 24* 21*  CREATININE 2.27* 2.04*   < > 1.53*   < > 1.43* 1.46*   < >  --    < > 1.01   < > 0.99   < > 0.94 0.98 0.92 0.92 0.94  CALCIUM 12.4* 12.4*   < > 12.1*   < > 10.2 9.6   < >  --    < > 8.0*   < > 7.7*   < > 8.5* 8.0* 7.8* 8.2* 7.7*  MG 3.7* 3.5*  --   --   --   --  1.9  --   --   --   --   --  1.5*  --   --   --   --   --   --   PHOS 3.0 1.9*  --  1.1*  --  5.9* 1.2*  --  2.9  --  2.7  --    --   --   --   --   --   --   --    < > = values in this interval not displayed.  GFR: Estimated Creatinine Clearance: 100.7 mL/min (by C-G formula based on SCr of 0.94 mg/dL). Liver Function Tests: Recent Labs  Lab 01/15/23 0439 01/16/23 0447 01/17/23 0431 01/18/23 0400 01/19/23 0415  AST 74* 47* 45* 43* 35  ALT 42 37 35 35 32  ALKPHOS 45 50 55 52 50  BILITOT 1.0 1.4* 2.0* 2.3* 1.3*  PROT 6.1* 6.0* 6.1* 5.9* 5.3*  ALBUMIN 2.6* 2.6* 2.7* 2.8* 2.5*   Recent Labs  Lab 01/12/23 1600 01/13/23 0031 01/13/23 0253 01/15/23 0439  LIPASE 1,194* 166* 1,012* 147*   Recent Labs  Lab 01/13/23 0003 01/15/23 1001 01/16/23 0447  AMMONIA 33 54* 18   Coagulation Profile: Recent Labs  Lab 01/12/23 1423  INR 1.3*   No results for input(s): "TSH", "T4TOTAL", "FREET4", "T3FREE", "THYROIDAB" in the last 72 hours.  Sepsis Labs: Recent Labs  Lab 01/12/23 1602 01/12/23 1932 01/13/23 0003 01/13/23 0031 01/13/23 0959  PROCALCITON  --   --   --  3.79  --   LATICACIDVEN 6.8* 2.5* 2.3*  --  1.6    Recent Results (from the past 240 hour(s))  Blood Culture (routine x 2)     Status: None   Collection Time: 01/12/23  2:23 PM   Specimen: Left Antecubital; Blood  Result Value Ref Range Status   Specimen Description   Final    LEFT ANTECUBITAL BLOOD Performed at [facility], [address] [place], [place] [address]    Special Requests   Final    Blood Culture adequate volume BOTTLES DRAWN AEROBIC AND ANAEROBIC Performed at [facility], [address], [place], [place] [address]    Culture   Final    NO GROWTH 5 DAYS Performed at [facility], [address] [place], [place] [address]    Report Status 01/17/2023 FINAL  Final  Resp panel by RT-PCR (RSV, Flu A&B, Covid) Anterior Nasal Swab     Status: None   Collection Time: 01/12/23  2:30 PM   Specimen: Anterior Nasal Swab  Result Value Ref Range Status   SARS Coronavirus 2 by RT PCR NEGATIVE  NEGATIVE Final    Comment: (NOTE) SARS-CoV-2 target nucleic acids are NOT DETECTED.  The SARS-CoV-2 RNA is generally detectable in upper respiratory specimens during the acute phase of infection. The lowest concentration of SARS-CoV-2 viral copies this assay can detect is 138 copies/mL. A negative result does not preclude SARS-Cov-2 infection and should not be used as the sole basis for treatment or other patient management decisions. A negative result may occur with  improper specimen collection/handling, submission of specimen other than nasopharyngeal swab, presence of viral mutation(s) within the areas targeted by this assay, and inadequate number of viral copies(<138 copies/mL). A negative result must be combined with clinical observations, patient history, and epidemiological information. The expected result is Negative.  Fact Sheet for Patients:  BloggerCourse.com  Fact Sheet for Healthcare Providers:  SeriousBroker.it  This test is no t yet approved or cleared by the [place] FDA and  has been authorized for detection and/or diagnosis of SARS-CoV-2 by FDA under an Emergency Use Authorization (EUA). This EUA will remain  in effect (meaning this test can be used) for the duration of the COVID-19 declaration under Section 564(b)(1) of the Act, 21 U.S.C.section 360bbb-3(b)(1), unless the authorization is terminated  or revoked sooner.       Influenza A by PCR NEGATIVE NEGATIVE Final   Influenza B by PCR NEGATIVE NEGATIVE Final  Comment: (NOTE) The Xpert Xpress SARS-CoV-2/FLU/RSV plus assay is intended as an aid in the diagnosis of influenza from Nasopharyngeal swab specimens and should not be used as a sole basis for treatment. Nasal washings and aspirates are unacceptable for Xpert Xpress SARS-CoV-2/FLU/RSV testing.  Fact Sheet for Patients: BloggerCourse.comhttps://www.fda.gov/media/152166/download  Fact Sheet for Healthcare  Providers: SeriousBroker.ithttps://www.fda.gov/media/152162/download  This test is not yet approved or cleared by the [place] FDA and has been authorized for detection and/or diagnosis of SARS-CoV-2 by FDA under an Emergency Use Authorization (EUA). This EUA will remain in effect (meaning this test can be used) for the duration of the COVID-19 declaration under Section 564(b)(1) of the Act, 21 U.S.C. section 360bbb-3(b)(1), unless the authorization is terminated or revoked.     Resp Syncytial Virus by PCR NEGATIVE NEGATIVE Final    Comment: (NOTE) Fact Sheet for Patients: BloggerCourse.comhttps://www.fda.gov/media/152166/download  Fact Sheet for Healthcare Providers: SeriousBroker.ithttps://www.fda.gov/media/152162/download  This test is not yet approved or cleared by the [place] FDA and has been authorized for detection and/or diagnosis of SARS-CoV-2 by FDA under an Emergency Use Authorization (EUA). This EUA will remain in effect (meaning this test can be used) for the duration of the COVID-19 declaration under Section 564(b)(1) of the Act, 21 U.S.C. section 360bbb-3(b)(1), unless the authorization is terminated or revoked.  Performed at [facility] [place], [address] [place], [place] [address]   Blood Culture (routine x 2)     Status: None   Collection Time: 01/12/23  2:37 PM   Specimen: BLOOD RIGHT HAND  Result Value Ref Range Status   Specimen Description   Final    BLOOD RIGHT HAND BLOOD Performed at [facility] [place], [address] [place], [place] [address]    Special Requests   Final    Blood Culture adequate volume BOTTLES DRAWN AEROBIC AND ANAEROBIC Performed at [facility] [place], [address] [place], [place] [address]    Culture   Final    NO GROWTH 5 DAYS Performed at [facility], [address] [place], [place] [address]    Report Status 01/17/2023 FINAL  Final  Urine Culture     Status: None   Collection Time: 01/12/23  4:43 PM   Specimen: In/Out Cath  Urine  Result Value Ref Range Status   Specimen Description   Final    IN/OUT CATH URINE Performed at [facility] [place], [address] [place], [place] [address]    Special Requests   Final    NONE Performed at [facility] [place], [address] [place], [place] [address]    Culture   Final    NO GROWTH Performed at [facility], [address] [place], [place] [address]    Report Status 01/13/2023 FINAL  Final  MRSA Next Gen by PCR, Nasal     Status: None   Collection Time: 01/12/23 11:06 PM  Result Value Ref Range Status   MRSA by PCR Next Gen NOT DETECTED NOT DETECTED Final    Comment: (NOTE) The GeneXpert MRSA Assay (FDA approved for NASAL specimens only), is one component of a comprehensive MRSA colonization surveillance program. It is not intended to diagnose MRSA infection nor to guide or monitor treatment for MRSA infections. Test performance is not FDA approved in patients less than 58 years old. Performed at [facility], [address] [place], [place] [address]   Respiratory (~20 pathogens) panel by PCR  Status: None   Collection Time: 01/12/23 11:52 PM   Specimen: Nasopharyngeal Swab; Respiratory  Result Value Ref Range Status   Adenovirus NOT DETECTED NOT DETECTED Final   Coronavirus 229E NOT DETECTED NOT DETECTED Final    Comment: (NOTE) The Coronavirus on the Respiratory Panel, DOES NOT test for the novel  Coronavirus (2019 nCoV)    Coronavirus HKU1 NOT DETECTED NOT DETECTED Final   Coronavirus NL63 NOT DETECTED NOT DETECTED Final   Coronavirus OC43 NOT DETECTED NOT DETECTED Final   Metapneumovirus NOT DETECTED NOT DETECTED Final   Rhinovirus / Enterovirus NOT DETECTED NOT DETECTED Final   Influenza A NOT DETECTED NOT DETECTED Final   Influenza B NOT DETECTED NOT DETECTED Final   Parainfluenza Virus 1 NOT DETECTED NOT DETECTED Final   Parainfluenza Virus 2 NOT DETECTED NOT DETECTED Final   Parainfluenza Virus 3  NOT DETECTED NOT DETECTED Final   Parainfluenza Virus 4 NOT DETECTED NOT DETECTED Final   Respiratory Syncytial Virus NOT DETECTED NOT DETECTED Final   Bordetella pertussis NOT DETECTED NOT DETECTED Final   Bordetella Parapertussis NOT DETECTED NOT DETECTED Final   Chlamydophila pneumoniae NOT DETECTED NOT DETECTED Final   Mycoplasma pneumoniae NOT DETECTED NOT DETECTED Final    Comment: Performed at [facility], [place]. [address] [place], [place] [address]  Antimicrobials: Anti-infectives (From admission, onward)    Start     Dose/Rate Route Frequency Ordered Stop   01/13/23 1800  vancomycin (VANCOREADY) IVPB 1500 mg/300 mL  Status:  Discontinued        1,500 mg 150 mL/hr over 120 Minutes Intravenous Every 24 hours 01/13/23 1404 01/15/23 0817   01/13/23 0100  ceFEPIme (MAXIPIME) 2 g in sodium chloride 0.9 % 100 mL IVPB  Status:  Discontinued        2 g 200 mL/hr over 30 Minutes Intravenous Every 12 hours 01/13/23 0002 01/15/23 0837   01/13/23 0030  metroNIDAZOLE (FLAGYL) IVPB 500 mg  Status:  Discontinued        500 mg 100 mL/hr over 60 Minutes Intravenous Every 12 hours 01/12/23 2343 01/15/23 0837   01/12/23 1740  vancomycin variable dose per unstable renal function (pharmacist dosing)  Status:  Discontinued         Does not apply See admin instructions 01/12/23 1740 01/13/23 1406   01/12/23 1630  vancomycin (VANCOCIN) 500 mg in sodium chloride 0.9 % 100 mL IVPB       See Hyperspace for full Linked Orders Report.   500 mg 100 mL/hr over 60 Minutes Intravenous  Once 01/12/23 1523 01/12/23 1818   01/12/23 1530  vancomycin (VANCOCIN) IVPB 1000 mg/200 mL premix       See Hyperspace for full Linked Orders Report.   1,000 mg 200 mL/hr over 60 Minutes Intravenous  Once 01/12/23 1523 01/12/23 1652   01/12/23 1500  piperacillin-tazobactam (ZOSYN) IVPB 3.375 g        3.375 g 100 mL/hr over 30 Minutes Intravenous  Once 01/12/23 1450 01/12/23 1530      Culture/Microbiology     Component Value Date/Time   SDES  01/12/2023 1643    IN/OUT CATH URINE Performed at [facility], [address] [place], [place] [address]    [facility]  01/12/2023 1643    NONE Performed at [facility], [address], [place], [place] [address]    CULT  01/12/2023 1643    NO GROWTH Performed at [facility], [place]  [address] [place], [place] [address]    REPTSTATUS 01/13/2023 FINAL 01/12/2023 1643  Other culture-see note  Radiology Studies: CT ABDOMEN PELVIS W CONTRAST  Result Date: 01/18/2023 CLINICAL DATA:  57 year old male with altered mental status, radiographic evidence of small-bowel obstruction yesterday. EXAM: CT ABDOMEN AND PELVIS WITH CONTRAST TECHNIQUE: Multidetector CT imaging of the abdomen and pelvis was performed using the standard protocol following bolus administration of intravenous contrast. RADIATION DOSE REDUCTION: This exam was performed according to the departmental dose-optimization program which includes automated exposure control, adjustment of the mA and/or kV according to patient size and/or use of iterative reconstruction technique. CONTRAST:  100mL OMNIPAQUE IOHEXOL 300 MG/ML  SOLN COMPARISON:  KUB yesterday. Noncontrast CT Chest, Abdomen, and Pelvis 01/12/2023. FINDINGS: Lower chest: New layering pleural effusions, small and greater on the left. Enhancing adjacent lower lobe atelectasis. No pericardial effusion. Partially visible SVC central line. Calcified coronary artery atherosclerosis. No cardiomegaly. Hepatobiliary: Fatty liver. Contracted gallbladder. No bile duct enlargement. Pancreas: Inflammation surrounding the tail of the pancreas does not appear significantly changed. No overt pancreatic necrosis. No ductal dilatation. Spleen: Pancreatic tail inflammation at the splenic hilum, otherwise negative. Adrenals/Urinary Tract: Negative adrenal glands. Symmetric renal enhancement. Simple density left lower pole cyst (no follow-up  imaging recommended). On delayed images there is symmetric renal contrast excretion. Ureters are decompressed. Bladder is decompressed. Stomach/Bowel: Decompressed rectum. Highly redundant sigmoid colon which tracks into the right upper quadrant. The sigmoid contains some fluid but is nondilated. Sigmoid diverticula. Fluid also in nondilated descending colon. Fluid in slightly larger transverse colon, hepatic flexure. Fluid in nondilated right colon. Appendix remains normal on series 2, image 69. Fluid in decompressed terminal ileum and multiple distal small bowel loops. Decompressed stomach. Fluid in the duodenum. Decompressed proximal jejunum, but dilated fluid-filled small bowel beginning in the left abdomen. Dilated small bowel air-fluid levels continuing to the level of a left lower quadrant transition point on series 5, image 59. However, there are mildly dilated fluid-filled loops distal to that transition also. And then a gradual transition then to the terminal ileum. No pneumoperitoneum. There is a small volume of simple density free fluid in the bilateral lower quadrants, distal small bowel mesentery (coronal image 52 on the right). Vascular/Lymphatic: Major arterial structures in the abdomen and pelvis are patent. No calcified atherosclerosis or lymphadenopathy identified. Portal venous system including the splenic vein appear patent. Reproductive: Negative. Other: No pelvis free fluid.  Stable pelvic phleboliths. 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 No acute osseous abnormality identified. IMPRESSION: 1. Evidence of ongoing Acute Pancreatitis at the tail. No pancreatic necrosis, pseudocyst, or other direct complications. 2. Fluid-filled small and large bowel loops throughout the abdomen and pelvis compatible with enteritis/diarrhea. Plus abnormally dilated small bowel loops suggesting at least Partial Mechanical Small Bowel Obstruction with a transition point in the  left lower quadrant (series 5, image 59). But also fluid distended loops distal to that transition, gradually tapering to the terminal ileum. 3. Small free fluid in both lower quadrants.  No pneumoperitoneum. 4. New small layering pleural effusions with atelectasis since 01/12/2023, greater on the left. Electronically Signed   By: Odessa FlemingH  Hall M.D.   On: 01/18/2023 12:41   DG Abd Portable 1V  Result Date: 01/17/2023 CLINICAL DATA:  Abdominal distension. EXAM: PORTABLE ABDOMEN - 1 VIEW COMPARISON:  January 15, 2023. FINDINGS: Dilated small bowel loops are noted in the left side of the abdomen and pelvis concerning for distal small bowel obstruction or possibly ileus. No colonic  dilatation is noted. No radio-opaque calculi or other significant radiographic abnormality are seen. IMPRESSION: Small bowel dilatation is noted concerning for distal small bowel obstruction or possibly ileus. Electronically Signed   By: Lupita RaiderJames  Green Jr M.D.   On: 01/17/2023 13:41     LOS: 7 days   Lanae Boastamesh Kahley Leib, MD [facility]  01/19/2023, 8:22 AM

## 2023-01-20 DIAGNOSIS — K567 Ileus, unspecified: Secondary | ICD-10-CM | POA: Diagnosis not present

## 2023-01-20 LAB — CBC
HCT: 26.3 % — ABNORMAL LOW (ref 39.0–52.0)
Hemoglobin: 8.4 g/dL — ABNORMAL LOW (ref 13.0–17.0)
MCH: 32.3 pg (ref 26.0–34.0)
MCHC: 31.9 g/dL (ref 30.0–36.0)
MCV: 101.2 fL — ABNORMAL HIGH (ref 80.0–100.0)
Platelets: 151 10*3/uL (ref 150–400)
RBC: 2.6 MIL/uL — ABNORMAL LOW (ref 4.22–5.81)
RDW: 12.8 % (ref 11.5–15.5)
WBC: 11.6 10*3/uL — ABNORMAL HIGH (ref 4.0–10.5)
nRBC: 1.1 % — ABNORMAL HIGH (ref 0.0–0.2)

## 2023-01-20 LAB — BASIC METABOLIC PANEL
Anion gap: 7 (ref 5–15)
BUN: 12 mg/dL (ref 6–20)
CO2: 22 mmol/L (ref 22–32)
Calcium: 7.6 mg/dL — ABNORMAL LOW (ref 8.9–10.3)
Chloride: 111 mmol/L (ref 98–111)
Creatinine, Ser: 0.91 mg/dL (ref 0.61–1.24)
GFR, Estimated: >60 mL/min (ref >60)
Glucose, Bld: 138 mg/dL — ABNORMAL HIGH (ref 70–99)
Potassium: 3 mmol/L — ABNORMAL LOW (ref 3.5–5.1)
Sodium: 140 mmol/L (ref 135–145)

## 2023-01-20 LAB — GLUCOSE, CAPILLARY
Glucose-Capillary: 150 mg/dL — ABNORMAL HIGH (ref 70–99)
Glucose-Capillary: 179 mg/dL — ABNORMAL HIGH (ref 70–99)
Glucose-Capillary: 231 mg/dL — ABNORMAL HIGH (ref 70–99)
Glucose-Capillary: 231 mg/dL — ABNORMAL HIGH (ref 70–99)
Glucose-Capillary: 302 mg/dL — ABNORMAL HIGH (ref 70–99)

## 2023-01-20 MED ORDER — POTASSIUM CHLORIDE CRYS ER 20 MEQ PO TBCR
40.0000 meq | EXTENDED_RELEASE_TABLET | Freq: Once | ORAL | Status: AC
  Administered 2023-01-20: 40 meq via ORAL
  Filled 2023-01-20: qty 2

## 2023-01-20 MED ORDER — ENSURE ENLIVE PO LIQD
237.0000 mL | Freq: Two times a day (BID) | ORAL | Status: DC
  Administered 2023-01-20 – 2023-01-22 (×4): 237 mL via ORAL

## 2023-01-20 MED ORDER — INSULIN ASPART 100 UNIT/ML IJ SOLN
0.0000 [IU] | INTRAMUSCULAR | Status: DC
  Administered 2023-01-20: 3 [IU] via SUBCUTANEOUS
  Administered 2023-01-20: 11 [IU] via SUBCUTANEOUS
  Administered 2023-01-20 – 2023-01-21 (×2): 5 [IU] via SUBCUTANEOUS
  Administered 2023-01-21: 8 [IU] via SUBCUTANEOUS
  Administered 2023-01-21 – 2023-01-22 (×3): 2 [IU] via SUBCUTANEOUS

## 2023-01-20 MED ORDER — INSULIN DETEMIR 100 UNIT/ML ~~LOC~~ SOLN
40.0000 [IU] | Freq: Every day | SUBCUTANEOUS | Status: DC
  Filled 2023-01-20: qty 0.4

## 2023-01-20 NOTE — Progress Notes (Signed)
PROGRESS NOTE Vincent Thompson  BJY:[id number] DOB: 1966/02/23 DOA: 01/12/2023 PCP: [name], Michael, MD   Brief Narrative/Hospital Course: 57 year old male with hypertension elevated LFTs anxiety hyperlipidemia who has been having cough and upper respiratory symptoms for 2 and half weeks-had tested negative for COVID a week ago, presented with lethargy/confusion, tired. In the ED low-grade fever 100.2 tachycardic in the 120s, stable BP and oxygen saturation Labs obtained that showed blood sugar more than 1200, hyperkalemia, AKI w/ creatinine 3.6, transaminitis lactic acidosis 5.9 leukocytosis 18.7 BHB more than 8 COVID-19 influenza negative, lipase elevated 1194,CK4 39 troponin 44.  Normal folate and elevated B12, iron 39 but ferritin high at 2484. Imaging: Chest x-ray low lung volumes, CT chest abdomen abdomen pelvis w/o: Mild stranding/edema of pancreas concerning for pancreatitis, likely reactive/secondary duodenitis, hepatic steatosis, 2.6 cm left renal lesion. RVP panel pending Patient was aggressively volume resuscitated with 2 L bolus, was given vancomycin and Zosyn, placed on IV insulin drip, cefepime Flagyl and admitted for further management Critical care Dr Merrily Pewhand was consulted but [facility] Due to severe electrolyte imbalance nephrology was consulted Patient managed aggressive normal saline along with D5W insulin drip phosphorus and potassium replacement.  Electrolytes had significantly improved but patient remains confused with pancreatitis distended abdomen x-ray 1/24 showed ileus.  Seen by critical care. 1/25: Much less confused, passing gas and had bowel movement. 1/27: Ongoing persistent leukocytosis abdominal distention, CT abdomen obtained with IV contrast showing fluid-filled small at this time and also in the colon seen by Dr [name]- no concern for bowel obstruction advised to advance diet. 1/28: Transitioned to subcu insulin regimen   Subjective: Seen  and examined this morning he is ambulating abdomen is less distended, passing flatus Overnight BP stable.  Labs reviewed mild hypokalemia blood sugar ranging 160s to 230s.  BM charted x 3. No abdominal pain nausea vomiting  Assessment and Plan: Principal Problem:   DKA (diabetic ketoacidosis) (HCC) Active Problems:   AKI (acute kidney injury) (HCC)   Pancreatitis   Hypercalcemia   Elevated LFTs   SIRS (systemic inflammatory response syndrome) (HCC)   Acute metabolic encephalopathy   Ileus (HCC)   Hypernatremia  T2DM-new onset w/ hx of prediabetes w/ uncontrolled hba1c at 13 w/ uncontrolled hyperglycemia >1200  DKA on presentation-resolved now: 1/28: Transitioned to subcu insulin regimen.Blood sugar is fairly controlled, continue current Levemir 30 units, SSI monitor and adjust insulin regimen further.Continue insulin /diabetes/diet/hppoglycemic education.  Will need insulin teaching prior to discharge Recent Labs  Lab 01/19/23 1517 01/19/23 1601 01/19/23 2109 01/20/23 0737 01/20/23 1123  GLUCAP 161* 235* 212* 150* 231*   Ileus with abdominal distention nausea and vomiting: Patient having abdominal distention since 1/24 x-ray showing ileus. 1/27: Ongoing persistent leukocytosis abdominal distention, CT abdomen obtained with IV contrast showing fluid-filled small at this time and also in the colon seen by Dr [name]- no concern for bowel obstruction.  Patient having bowel movement abdominal distention improving.  Diet has been advanced on full liquid diet>advance to soft diabetic diet.Appreciate GI and surgery input.   Acute Pancreatitis:lipase 166>1012>147:CT chest abdomen pelvis in ED and repeat 1/27 ongoing acute pancreatitis no necrosis pseudocyst or other complication.likely EtOH induced.TG level normal.Ultrasound 1/24 hepatic steatosis no other acute finding. Continue on diet as tolerated as above.  He has no abdomen pain   History of alcohol use 3-5 drinks per  week Alcoholic hepatitis/abnormal LFTs/possible alcoholic fatty liver disease: Suspect he had withdrawal with delerium.managed with CIWA scale Ativan, thiamine folate/multivitamin.  Ethanol level negative drug screen negative.  Continue to encourage cessation  Acute metabolic encephalopathy in the setting of patient's DKA/SIRS possible alcohol withdrawal delirium: Mental status improved.  He S/P high-dose thiamine.  Continue supportive care delirium precaution fall precaution  AKI: 2/2 volume depletion with DKA> resolved.Nephrology signed off, Renal ultrasound stable  Severe dehydration with DKA with free water deficit: Resolved with aggressive IVF Hypernatremia:resolved w/ ivf.  Nephro signed off. On gentle RL D5W w/ insulin drip and for ileus Hypophosphatemia:In the setting of DKA/insulin YJE:HUDJSHFW. Hypercalcemia-in the setting of severe dehydration: PTH appropriately suppressed, ca normal. Hypokalemia: K BEING REPLETED PO Recent Labs  Lab 01/13/23 1140 01/13/23 1354 01/13/23 2029 01/14/23 0259 01/14/23 1520 01/14/23 1551 01/15/23 0051 01/15/23 0439 01/15/23 0838 01/15/23 2050 01/16/23 0009 01/16/23 2040 01/17/23 0431 01/18/23 0400 01/19/23 0415 01/20/23 0438  K 3.3*   < > 5.3* 2.9*   < >  --    < > 5.0   < > 3.2*   < > 3.6 3.6 3.6 3.4* 3.0*  CALCIUM 12.1*   < > 10.2 9.6   < >  --    < > 8.0*   < > 7.7*   < > 8.0* 7.8* 8.2* 7.7* 7.6*  MG  --   --   --  1.9  --   --   --   --   --  1.5*  --   --   --   --   --   --   PHOS 1.1*  --  5.9* 1.2*  --  2.9  --  2.7  --   --   --   --   --   --   --   --    < > = values in this interval not displayed.   Vitamin D Deficiency at 19.5-pending calcitriol level- start high dose repalcement once taking po. Rhabdomyolysis: Nontraumatic,CK improved Elevated troponin: Patient without chest pain likely demand ischemia in the setting of severe electrolyte imbalance.  Trops flat  SIRS 2/2 pancreatitis Leucocytosis Lactic  acidosis: +procal 3.7>Cxr,CT chest on pelvis  UA> without evidence of infection - 2/2 patient's dehydration/dka/pancreatitis> blood culture negative so far> off antibiotics 1/24.  WBC now improving, overall remains afebrile Recent Labs  Lab 01/16/23 0447 01/17/23 0431 01/18/23 0400 01/19/23 0415 01/20/23 0438  WBC 12.9* 16.0* 17.6* 11.2* 11.6*   Weight gain on admission 180 pounds>: But reports his baseline weight is around 200 pounds now at 215 1/26- see below: Will consider IV Lasix once ileus better. Currently no shortness of breath and no leg edema.  Monitor urine output, daily weight Net IO Since Admission: 16,130.96 mL [01/20/23 1132]  Filed Weights   01/15/23 0414 01/16/23 0403 01/17/23 0408  Weight: 92.4 kg 97.7 kg 97.5 kg    DVT prophylaxis: enoxaparin (LOVENOX) injection 40 mg Start: 01/15/23 1200 SCDs Start: 01/12/23 2346 Code Status:   Code Status: Full Code Family Communication: plan of care discussed with patient/no family at bedside I had updated patient's wife at the bedside previously.  Family were updated at bedside 1/25 evening  Patient status is: Inpatient because of DKA/severe electrolyte derangement and encephalopathy Level of care: Med-Surg > ok to Move to progressive bed today Dispo: The patient is from:Home            Anticipated disposition: home 1-2 days Objective: Vitals last 24 hrs: Vitals:   01/20/23 0356 01/20/23 0400 01/20/23 0800 01/20/23 0819  BP:  (!) 140/111 119/80  Pulse:  99 94   Resp:  (!) 24 16   Temp: 98.2 F (36.8 C)   98.2 F (36.8 C)  TempSrc: Oral   Oral  SpO2:  98% 96%   Weight:      Height:       Weight change:   Physical Examination: General exam: AAox3, weak,older appearing HEENT:Oral mucosa moist, Ear/Nose WNL grossly, dentition normal. Respiratory system: bilaterally clear BS,no use of accessory muscle Cardiovascular system: S1 & S2 +, regular rate, JVD NEG. Gastrointestinal system: Abdomen soft, DISTENDED  NT,BS+ Nervous System:Alert, awake, moving extremities and grossly nonfocal Extremities: LE ankle edema NEG, lower extremities warm Skin: No rashes,no icterus. MSK: Normal muscle bulk,tone, power   Medications reviewed:  Scheduled Meds:  Chlorhexidine Gluconate Cloth  6 each Topical Daily   enoxaparin (LOVENOX) injection  40 mg Subcutaneous Q24H   feeding supplement  237 mL Oral BID BM   folic acid  1 mg Oral Daily   insulin aspart  0-15 Units Subcutaneous TID WC   insulin aspart  0-5 Units Subcutaneous QHS   insulin detemir  30 Units Subcutaneous Daily   insulin starter kit- pen needles  1 kit Other Once   multivitamin with minerals  1 tablet Oral Daily   sodium chloride flush  10-40 mL Intracatheter Q12H   thiamine (VITAMIN B1) injection  100 mg Intravenous Q8H   [START ON 01/21/2023] thiamine  100 mg Oral Daily   Continuous Infusions:  insulin Stopped (01/19/23 1518)    Diet Order             DIET SOFT Room service appropriate? Yes; Fluid consistency: Thin  Diet effective now                   Intake/Output Summary (Last 24 hours) at 01/20/2023 1132 Last data filed at 01/20/2023 0800 Gross per 24 hour  Intake 997.39 ml  Output 700 ml  Net 297.39 ml   Net IO Since Admission: 16,130.96 mL [01/20/23 1132]  Wt Readings from Last 3 Encounters:  01/17/23 97.5 kg  07/31/20 88.5 kg  09/27/19 88.5 kg     Unresulted Labs (From admission, onward)     Start     Ordered   01/20/23 0500  Basic metabolic panel  Daily,   R     Question:  Specimen collection method  Answer:  Unit=Unit collect   01/19/23 1448   01/20/23 0500  CBC  Daily,   R     Question:  Specimen collection method  Answer:  Unit=Unit collect   01/19/23 1448   01/13/23 0500  CBC with Differential  Tomorrow morning,   R        01/12/23 2343          Data Reviewed: I have personally reviewed following labs and imaging studies CBC: Recent Labs  Lab 01/16/23 0447 01/17/23 0431 01/18/23 0400  01/19/23 0415 01/20/23 0438  WBC 12.9* 16.0* 17.6* 11.2* 11.6*  HGB 13.2 11.4* 9.8* 8.2* 8.4*  HCT 41.3 35.5* 31.2* 27.0* 26.3*  MCV 98.8 99.4 102.3* 103.8* 101.2*  PLT 129* 134* 149* 142* 151   Basic Metabolic Panel: Recent Labs  Lab 01/13/23 1140 01/13/23 1354 01/13/23 2029 01/14/23 0259 01/14/23 1520 01/14/23 1551 01/15/23 0051 01/15/23 0439 01/15/23 0838 01/15/23 2050 01/16/23 0009 01/16/23 2040 01/17/23 0431 01/18/23 0400 01/19/23 0415 01/20/23 0438  NA 163*   < > 159* 157*   < >  --    < > 145   < >  143   < > 142 144 145 145 140  K 3.3*   < > 5.3* 2.9*   < >  --    < > 5.0   < > 3.2*   < > 3.6 3.6 3.6 3.4* 3.0*  CL 126*   < > 126* 128*   < >  --    < > 119*   < > 118*   < > 115* 117* 116* 117* 111  CO2 27   < > 24 20*   < >  --    < > 19*   < > 19*   < > 19* 21* 23 23 22   GLUCOSE 277*   < > 237* 302*   < >  --    < > 159*   < > 179*   < > 195* 164* 154* 207* 138*  BUN 52*   < > 43* 38*   < >  --    < > 20   < > 19   < > 27* 26* 24* 21* 12  CREATININE 1.53*   < > 1.43* 1.46*   < >  --    < > 1.01   < > 0.99   < > 0.98 0.92 0.92 0.94 0.91  CALCIUM 12.1*   < > 10.2 9.6   < >  --    < > 8.0*   < > 7.7*   < > 8.0* 7.8* 8.2* 7.7* 7.6*  MG  --   --   --  1.9  --   --   --   --   --  1.5*  --   --   --   --   --   --   PHOS 1.1*  --  5.9* 1.2*  --  2.9  --  2.7  --   --   --   --   --   --   --   --    < > = values in this interval not displayed.   GFR: Estimated Creatinine Clearance: 104 mL/min (by C-G formula based on SCr of 0.91 mg/dL). Liver Function Tests: Recent Labs  Lab 01/15/23 0439 01/16/23 0447 01/17/23 0431 01/18/23 0400 01/19/23 0415  AST 74* 47* 45* 43* 35  ALT 42 37 35 35 32  ALKPHOS 45 50 55 52 50  BILITOT 1.0 1.4* 2.0* 2.3* 1.3*  PROT 6.1* 6.0* 6.1* 5.9* 5.3*  ALBUMIN 2.6* 2.6* 2.7* 2.8* 2.5*   Recent Labs  Lab 01/15/23 0439  LIPASE 147*   Recent Labs  Lab 01/15/23 1001 01/16/23 0447  AMMONIA 54* 18   Coagulation Profile: No results  for input(s): "INR", "PROTIME" in the last 168 hours.  No results for input(s): "TSH", "T4TOTAL", "FREET4", "T3FREE", "THYROIDAB" in the last 72 hours.  Sepsis Labs: No results for input(s): "PROCALCITON", "LATICACIDVEN" in the last 168 hours.   Recent Results (from the past 240 hour(s))  Blood Culture (routine x 2)     Status: None   Collection Time: 01/12/23  2:23 PM   Specimen: Left Antecubital; Blood  Result Value Ref Range Status   Specimen Description   Final    LEFT ANTECUBITAL BLOOD Performed at [facility], [address] [place], [place] [address]    Special Requests   Final    Blood Culture adequate volume BOTTLES DRAWN AEROBIC AND ANAEROBIC Performed at [facility], [address], [place], [place] [address]  Culture   Final    NO GROWTH 5 DAYS Performed at [facility], [address] [place], [place] [address]    Report Status 01/17/2023 FINAL  Final  Resp panel by RT-PCR (RSV, Flu A&B, Covid) Anterior Nasal Swab     Status: None   Collection Time: 01/12/23  2:30 PM   Specimen: Anterior Nasal Swab  Result Value Ref Range Status   SARS Coronavirus 2 by RT PCR NEGATIVE NEGATIVE Final    Comment: (NOTE) SARS-CoV-2 target nucleic acids are NOT DETECTED.  The SARS-CoV-2 RNA is generally detectable in upper respiratory specimens during the acute phase of infection. The lowest concentration of SARS-CoV-2 viral copies this assay can detect is 138 copies/mL. A negative result does not preclude SARS-Cov-2 infection and should not be used as the sole basis for treatment or other patient management decisions. A negative result may occur with  improper specimen collection/handling, submission of specimen other than nasopharyngeal swab, presence of viral mutation(s) within the areas targeted by this assay, and inadequate number of viral copies(<138 copies/mL). A negative result must be combined with clinical observations, patient history,  and epidemiological information. The expected result is Negative.  Fact Sheet for Patients:  BloggerCourse.comhttps://www.fda.gov/media/152166/download  Fact Sheet for Healthcare Providers:  SeriousBroker.ithttps://www.fda.gov/media/152162/download  This test is no t yet approved or cleared by the [place] FDA and  has been authorized for detection and/or diagnosis of SARS-CoV-2 by FDA under an Emergency Use Authorization (EUA). This EUA will remain  in effect (meaning this test can be used) for the duration of the COVID-19 declaration under Section 564(b)(1) of the Act, 21 U.S.C.section 360bbb-3(b)(1), unless the authorization is terminated  or revoked sooner.       Influenza A by PCR NEGATIVE NEGATIVE Final   Influenza B by PCR NEGATIVE NEGATIVE Final    Comment: (NOTE) The Xpert Xpress SARS-CoV-2/FLU/RSV plus assay is intended as an aid in the diagnosis of influenza from Nasopharyngeal swab specimens and should not be used as a sole basis for treatment. Nasal washings and aspirates are unacceptable for Xpert Xpress SARS-CoV-2/FLU/RSV testing.  Fact Sheet for Patients: BloggerCourse.comhttps://www.fda.gov/media/152166/download  Fact Sheet for Healthcare Providers: SeriousBroker.ithttps://www.fda.gov/media/152162/download  This test is not yet approved or cleared by the [place] FDA and has been authorized for detection and/or diagnosis of SARS-CoV-2 by FDA under an Emergency Use Authorization (EUA). This EUA will remain in effect (meaning this test can be used) for the duration of the COVID-19 declaration under Section 564(b)(1) of the Act, 21 U.S.C. section 360bbb-3(b)(1), unless the authorization is terminated or revoked.     Resp Syncytial Virus by PCR NEGATIVE NEGATIVE Final    Comment: (NOTE) Fact Sheet for Patients: BloggerCourse.comhttps://www.fda.gov/media/152166/download  Fact Sheet for Healthcare Providers: SeriousBroker.ithttps://www.fda.gov/media/152162/download  This test is not yet approved or cleared by the [place] FDA and has  been authorized for detection and/or diagnosis of SARS-CoV-2 by FDA under an Emergency Use Authorization (EUA). This EUA will remain in effect (meaning this test can be used) for the duration of the COVID-19 declaration under Section 564(b)(1) of the Act, 21 U.S.C. section 360bbb-3(b)(1), unless the authorization is terminated or revoked.  Performed at [facility], [address] [place], [place] [address]   Blood Culture (routine x 2)     Status: None   Collection Time: 01/12/23  2:37 PM   Specimen: BLOOD RIGHT HAND  Result Value Ref Range Status   Specimen Description   Final    BLOOD RIGHT HAND  BLOOD Performed at [facility], [address] [place], [place] [address]    Special Requests   Final    Blood Culture adequate volume BOTTLES DRAWN AEROBIC AND ANAEROBIC Performed at [facility], [address] [place], [place] [address]    Culture   Final    NO GROWTH 5 DAYS Performed at [facility], [address] [place], [place] [address]    Report Status 01/17/2023 FINAL  Final  Urine Culture     Status: None   Collection Time: 01/12/23  4:43 PM   Specimen: In/Out Cath Urine  Result Value Ref Range Status   Specimen Description   Final    IN/OUT CATH URINE Performed at [facility], [address] [place], [place] [address]    Special Requests   Final    NONE Performed at [facility], [address] [place], [place] [address]    Culture   Final    NO GROWTH Performed at [facility], [address] [place], [place] [address]    Report Status 01/13/2023 FINAL  Final  MRSA Next Gen by PCR, Nasal     Status: None   Collection Time: 01/12/23 11:06 PM  Result Value Ref Range Status   MRSA by PCR Next Gen NOT DETECTED NOT DETECTED Final    Comment: (NOTE) The GeneXpert MRSA Assay (FDA approved for NASAL specimens only), is one component of a comprehensive MRSA colonization surveillance program.  It is not intended to diagnose MRSA infection nor to guide or monitor treatment for MRSA infections. Test performance is not FDA approved in patients less than 58 years old. Performed at [facility], [address] [place], [place] [address]   Respiratory (~20 pathogens) panel by PCR     Status: None   Collection Time: 01/12/23 11:52 PM   Specimen: Nasopharyngeal Swab; Respiratory  Result Value Ref Range Status   Adenovirus NOT DETECTED NOT DETECTED Final   Coronavirus 229E NOT DETECTED NOT DETECTED Final    Comment: (NOTE) The Coronavirus on the Respiratory Panel, DOES NOT test for the novel  Coronavirus (2019 nCoV)    Coronavirus HKU1 NOT DETECTED NOT DETECTED Final   Coronavirus NL63 NOT DETECTED NOT DETECTED Final   Coronavirus OC43 NOT DETECTED NOT DETECTED Final   Metapneumovirus NOT DETECTED NOT DETECTED Final   Rhinovirus / Enterovirus NOT DETECTED NOT DETECTED Final   Influenza A NOT DETECTED NOT DETECTED Final   Influenza B NOT DETECTED NOT DETECTED Final   Parainfluenza Virus 1 NOT DETECTED NOT DETECTED Final   Parainfluenza Virus 2 NOT DETECTED NOT DETECTED Final   Parainfluenza Virus 3 NOT DETECTED NOT DETECTED Final   Parainfluenza Virus 4 NOT DETECTED NOT DETECTED Final   Respiratory Syncytial Virus NOT DETECTED NOT DETECTED Final   Bordetella pertussis NOT DETECTED NOT DETECTED Final   Bordetella Parapertussis NOT DETECTED NOT DETECTED Final   Chlamydophila pneumoniae NOT DETECTED NOT DETECTED Final   Mycoplasma pneumoniae NOT DETECTED NOT DETECTED Final    Comment: Performed at [facility], [address] [place], [place] [address]  Antimicrobials: Anti-infectives (From admission, onward)    Start     Dose/Rate Route Frequency Ordered Stop   01/13/23 1800  vancomycin (VANCOREADY) IVPB 1500 mg/300 mL  Status:  Discontinued        1,500 mg 150 mL/hr over 120 Minutes Intravenous Every 24 hours 01/13/23 1404  01/15/23 0817   01/13/23  0100  ceFEPIme (MAXIPIME) 2 g in sodium chloride 0.9 % 100 mL IVPB  Status:  Discontinued        2 g 200 mL/hr over 30 Minutes Intravenous Every 12 hours 01/13/23 0002 01/15/23 0837   01/13/23 0030  metroNIDAZOLE (FLAGYL) IVPB 500 mg  Status:  Discontinued        500 mg 100 mL/hr over 60 Minutes Intravenous Every 12 hours 01/12/23 2343 01/15/23 0837   01/12/23 1740  vancomycin variable dose per unstable renal function (pharmacist dosing)  Status:  Discontinued         Does not apply See admin instructions 01/12/23 1740 01/13/23 1406   01/12/23 1630  vancomycin (VANCOCIN) 500 mg in sodium chloride 0.9 % 100 mL IVPB       See Hyperspace for full Linked Orders Report.   500 mg 100 mL/hr over 60 Minutes Intravenous  Once 01/12/23 1523 01/12/23 1818   01/12/23 1530  vancomycin (VANCOCIN) IVPB 1000 mg/200 mL premix       See Hyperspace for full Linked Orders Report.   1,000 mg 200 mL/hr over 60 Minutes Intravenous  Once 01/12/23 1523 01/12/23 1652   01/12/23 1500  piperacillin-tazobactam (ZOSYN) IVPB 3.375 g        3.375 g 100 mL/hr over 30 Minutes Intravenous  Once 01/12/23 1450 01/12/23 1530      Culture/Microbiology    Component Value Date/Time   SDES  01/12/2023 1643    IN/OUT CATH URINE Performed at [facility], [address] [place], [place] [address]    [facility]  01/12/2023 1643    NONE Performed at [facility], [address] [place], [place] [address]    CULT  01/12/2023 1643    NO GROWTH Performed at [facility], [address] [place], [place] [address]    REPTSTATUS 01/13/2023 FINAL 01/12/2023 1643  Other culture-see note  Radiology Studies: CT ABDOMEN PELVIS W CONTRAST  Result Date: 01/18/2023 CLINICAL DATA:  57 year old male with altered mental status, radiographic evidence of small-bowel obstruction yesterday. EXAM: CT ABDOMEN AND PELVIS WITH CONTRAST TECHNIQUE: Multidetector CT imaging of the abdomen and pelvis was performed  using the standard protocol following bolus administration of intravenous contrast. RADIATION DOSE REDUCTION: This exam was performed according to the departmental dose-optimization program which includes automated exposure control, adjustment of the mA and/or kV according to patient size and/or use of iterative reconstruction technique. CONTRAST:  OMNIPAQUE IOHEXOL 300 MG/ML  SOLN COMPARISON:  KUB yesterday. Noncontrast CT Chest, Abdomen, and Pelvis 01/12/2023. FINDINGS: Lower chest: New layering pleural effusions, small and greater on the left. Enhancing adjacent lower lobe atelectasis. No pericardial effusion. Partially visible SVC central line. Calcified coronary artery atherosclerosis. No cardiomegaly. Hepatobiliary: Fatty liver. Contracted gallbladder. No bile duct enlargement. Pancreas: Inflammation surrounding the tail of the pancreas does not appear significantly changed. No overt pancreatic necrosis. No ductal dilatation. Spleen: Pancreatic tail inflammation at the splenic hilum, otherwise negative. Adrenals/Urinary Tract: Negative adrenal glands. Symmetric renal enhancement. Simple density left lower pole cyst (no follow-up imaging recommended). On delayed images there is symmetric renal contrast excretion. Ureters are decompressed. Bladder is decompressed. Stomach/Bowel: Decompressed rectum. Highly redundant sigmoid colon which tracks into the right upper quadrant. The sigmoid contains some fluid but is nondilated. Sigmoid diverticula. Fluid also in nondilated descending colon. Fluid in slightly larger transverse colon, hepatic flexure. Fluid in nondilated right colon. Appendix remains normal on series 2, image  69. Fluid in decompressed terminal ileum and multiple distal small bowel loops. Decompressed stomach. Fluid in the duodenum. Decompressed proximal jejunum, but dilated fluid-filled small bowel beginning in the left abdomen. Dilated small bowel air-fluid levels continuing to the level of a  left lower quadrant transition point on series 5, image 59. However, there are mildly dilated fluid-filled loops distal to that transition also. And then a gradual transition then to the terminal ileum. No pneumoperitoneum. There is a small volume of simple density free fluid in the bilateral lower quadrants, distal small bowel mesentery (coronal image 52 on the right). Vascular/Lymphatic: Major arterial structures in the abdomen and pelvis are patent. No calcified atherosclerosis or lymphadenopathy identified. Portal venous system including the splenic vein appear patent. Reproductive: Negative. Other: No pelvis free fluid.  Stable pelvic phleboliths. 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 No acute osseous abnormality identified. IMPRESSION: 1. Evidence of ongoing Acute Pancreatitis at the tail. No pancreatic necrosis, pseudocyst, or other direct complications. 2. Fluid-filled small and large bowel loops throughout the abdomen and pelvis compatible with enteritis/diarrhea. Plus abnormally dilated small bowel loops suggesting at least Partial Mechanical Small Bowel Obstruction with a transition point in the left lower quadrant (series 5, image 59). But also fluid distended loops distal to that transition, gradually tapering to the terminal ileum. 3. Small free fluid in both lower quadrants.  No pneumoperitoneum. 4. New small layering pleural effusions with atelectasis since 01/12/2023, greater on the left. Electronically Signed   By: Odessa Fleming M.D.   On: 01/18/2023 12:41     LOS: 8 days   Lanae Boast, MD [facility]  01/20/2023, 11:32 AM

## 2023-01-21 DIAGNOSIS — K567 Ileus, unspecified: Secondary | ICD-10-CM | POA: Diagnosis not present

## 2023-01-21 LAB — BASIC METABOLIC PANEL
Anion gap: 8 (ref 5–15)
BUN: 11 mg/dL (ref 6–20)
CO2: 22 mmol/L (ref 22–32)
Calcium: 7.7 mg/dL — ABNORMAL LOW (ref 8.9–10.3)
Chloride: 109 mmol/L (ref 98–111)
Creatinine, Ser: 0.84 mg/dL (ref 0.61–1.24)
GFR, Estimated: >60 mL/min (ref >60)
Glucose, Bld: 102 mg/dL — ABNORMAL HIGH (ref 70–99)
Potassium: 3.1 mmol/L — ABNORMAL LOW (ref 3.5–5.1)
Sodium: 139 mmol/L (ref 135–145)

## 2023-01-21 LAB — CBC
HCT: 24.1 % — ABNORMAL LOW (ref 39.0–52.0)
Hemoglobin: 7.6 g/dL — ABNORMAL LOW (ref 13.0–17.0)
MCH: 32.1 pg (ref 26.0–34.0)
MCHC: 31.5 g/dL (ref 30.0–36.0)
MCV: 101.7 fL — ABNORMAL HIGH (ref 80.0–100.0)
Platelets: 162 10*3/uL (ref 150–400)
RBC: 2.37 MIL/uL — ABNORMAL LOW (ref 4.22–5.81)
RDW: 13 % (ref 11.5–15.5)
WBC: 10.9 10*3/uL — ABNORMAL HIGH (ref 4.0–10.5)
nRBC: 1.4 % — ABNORMAL HIGH (ref 0.0–0.2)

## 2023-01-21 LAB — GLUCOSE, CAPILLARY
Glucose-Capillary: 103 mg/dL — ABNORMAL HIGH (ref 70–99)
Glucose-Capillary: 96 mg/dL (ref 70–99)
Glucose-Capillary: 244 mg/dL — ABNORMAL HIGH (ref 70–99)
Glucose-Capillary: 266 mg/dL — ABNORMAL HIGH (ref 70–99)
Glucose-Capillary: 157 mg/dL — ABNORMAL HIGH (ref 70–99)

## 2023-01-21 MED ORDER — POTASSIUM CHLORIDE CRYS ER 20 MEQ PO TBCR
40.0000 meq | EXTENDED_RELEASE_TABLET | Freq: Once | ORAL | Status: AC
  Administered 2023-01-21: 40 meq via ORAL
  Filled 2023-01-21: qty 2

## 2023-01-21 MED ORDER — VITAMIN D (ERGOCALCIFEROL) 1.25 MG (50000 UNIT) PO CAPS
50000.0000 [IU] | ORAL_CAPSULE | ORAL | Status: DC
  Administered 2023-01-21: 50000 [IU] via ORAL
  Filled 2023-01-21: qty 1

## 2023-01-21 MED ORDER — ACETAMINOPHEN 325 MG PO TABS
650.0000 mg | ORAL_TABLET | Freq: Four times a day (QID) | ORAL | Status: DC | PRN
  Administered 2023-01-21: 650 mg via ORAL
  Filled 2023-01-21: qty 2

## 2023-01-21 MED ORDER — INSULIN DETEMIR 100 UNIT/ML ~~LOC~~ SOLN
34.0000 [IU] | Freq: Every day | SUBCUTANEOUS | Status: DC
  Administered 2023-01-21 – 2023-01-22 (×2): 34 [IU] via SUBCUTANEOUS
  Filled 2023-01-21 (×2): qty 0.34

## 2023-01-21 MED ORDER — POTASSIUM CHLORIDE 10 MEQ/100ML IV SOLN
10.0000 meq | Freq: Once | INTRAVENOUS | Status: AC
  Administered 2023-01-21: 10 meq via INTRAVENOUS
  Filled 2023-01-21: qty 100

## 2023-01-21 NOTE — Progress Notes (Signed)
PROGRESS NOTE Vincent Thompson  YTK:[id number] DOB: Dec 20, 1966 DOA: 01/12/2023 PCP: [name], MD   Brief Narrative/Hospital Course: 57 year old male with hypertension elevated LFTs anxiety hyperlipidemia who has been having cough and upper respiratory symptoms for 2 and half weeks-had tested negative for COVID a week ago, presented with lethargy/confusion, tired. In the ED low-grade fever 100.2 tachycardic in the 120s, stable BP and oxygen saturation Labs obtained that showed blood sugar more than 1200, hyperkalemia, AKI w/ creatinine 3.6, transaminitis lactic acidosis 5.9 leukocytosis 18.7 BHB more than 8 COVID-19 influenza negative, lipase elevated 1194,CK4 39 troponin 44.  Normal folate and elevated B12, iron 39 but ferritin high at 2484. Imaging: Chest x-ray low lung volumes, CT chest abdomen abdomen pelvis w/o: Mild stranding/edema of pancreas concerning for pancreatitis, likely reactive/secondary duodenitis, hepatic steatosis, 2.6 cm left renal lesion. RVP panel pending Patient was aggressively volume resuscitated with 2 L bolus, was given vancomycin and Zosyn, placed on IV insulin drip, cefepime Flagyl and admitted for further management Critical care Dr Tacy Learn was consulted but advised stable for [facility] admission Due to severe electrolyte imbalance nephrology was consulted Patient managed aggressive normal saline along with D5W insulin drip phosphorus and potassium replacement.  Electrolytes had significantly improved but patient remains confused with pancreatitis distended abdomen x-ray 1/24 showed ileus.  Seen by critical care. 1/25: Much less confused, passing gas and had bowel movement. 1/27: Ongoing persistent leukocytosis abdominal distention, CT abdomen obtained with IV contrast showing fluid-filled small at this time and also in the colon seen by [name]- no concern for bowel obstruction advised to advance diet. 1/28: Transitioned to subcu insulin regimen    Subjective: Patient seen and examined. No new complaints. Overnight patient has been afebrile.  Labs this morning showed potassium 3.1 blood sugar in 96  Assessment and Plan: Principal Problem:   Ileus ([place]) Active Problems:   DKA (diabetic ketoacidosis) (HCC)   AKI (acute kidney injury) ([place])   Pancreatitis   Hypercalcemia   Elevated LFTs   SIRS (systemic inflammatory response syndrome) (HCC)   Acute metabolic encephalopathy   Hypernatremia  T2DM-new onset w/ hx of prediabetes w/ uncontrolled hba1c at 13 w/ uncontrolled hyperglycemia >1200  DKA on presentation-resolved now: 1/28: Transitioned to subcu insulin regimen.Blood sugar is now well-controlled, on 30 units Lantus, received 26 units sliding scale.  Adjust Lantus to 34 units, keep on SSI, monitor next 24 hours.  Continue on education on insulin /diabetes/diet/hppoglycemic Recent Labs  Lab 01/20/23 1613 01/20/23 2009 01/20/23 2336 01/21/23 0401 01/21/23 0731  GLUCAP 302* 231* 179* 103* 96    Ileus with abdominal distention nausea and vomiting: Patient having abdominal distention since 1/24 x-ray showing ileus. 1/27: Ongoing persistent leukocytosis abdominal distention, CT abdomen obtained with IV contrast showing fluid-filled small at this time and also in the colon seen by [name]- no concern for bowel obstruction> diet advanced, tolerating soft diet.  Abdominal distention resolving, having bowel movement  Acute Pancreatitis:lipase 166>1012>147:CT chest abdomen pelvis in ED and repeat 1/27 ongoing acute pancreatitis no necrosis pseudocyst or other complication.likely EtOH induced.TG level normal.Ultrasound 1/24 hepatic steatosis no other acute finding. Continue on diet as tolerated as above.  He has no abdomen pain   History of alcohol use 3-5 drinks per week Alcoholic hepatitis/abnormal LFTs/possible alcoholic fatty liver disease: Suspect he had withdrawal with delerium.managed with CIWA scale Ativan, thiamine  folate/multivitamin.  Ethanol level negative drug screen negative.  Continue to encourage cessation  Acute metabolic encephalopathy in the setting of patient's DKA/SIRS possible  alcohol withdrawal delirium: Mental status improved.S/P high-dose thiamine.  Continue supportive care delirium precaution fall precaution  AKI: 2/2 volume depletion with DKA> resolved.Nephrology signed off, Renal ultrasound stable  Severe dehydration with DKA with free water deficit: Resolved with aggressive IVF Hypernatremia:resolved w/ ivf.  Nephro signed off. Off ivf Hypophosphatemia:In the setting of DKA/insulin JJO:ACZYSAYT. Hypercalcemia-in the setting of severe dehydration: PTH appropriately suppressed, ca normal. Hypokalemia: kremains persistently low suspect multifactorial, insulin needs.  Replace aggressively today, ensure potassium is stable prior to discharge recheck in the evening  Recent Labs  Lab 01/14/23 1551 01/15/23 0051 01/15/23 0439 01/15/23 0838 01/15/23 2050 01/16/23 0009 01/17/23 0431 01/18/23 0400 01/19/23 0415 01/20/23 0438 01/21/23 0413  K  --    < > 5.0   < > 3.2*   < > 3.6 3.6 3.4* 3.0* 3.1*  CALCIUM  --    < > 8.0*   < > 7.7*   < > 7.8* 8.2* 7.7* 7.6* 7.7*  MG  --   --   --   --  1.5*  --   --   --   --   --   --   PHOS 2.9  --  2.7  --   --   --   --   --   --   --   --    < > = values in this interval not displayed.    Vitamin D Deficiency at 19.5//calcitriol low as well:start weekly high dose repalcement. Rhabdomyolysis: Nontraumatic,CK improved Elevated troponin: Patient without chest pain likely demand ischemia in the setting of severe electrolyte imbalance.  Trops flat  SIRS 2/2 pancreatitis Leucocytosis Lactic acidosis: +procal 3.7>Cxr,CT chest on pelvis  UA> without evidence of infection - 2/2 patient's dehydration/dka/pancreatitis> blood culture negative so far> off antibiotics 1/24.  WBC now improving, overall remains afebrile Recent Labs  Lab 01/17/23 0431  01/18/23 0400 01/19/23 0415 01/20/23 0438 01/21/23 0413  WBC 16.0* 17.6* 11.2* 11.6* 10.9*    Weight gain on admission 180 pounds>: But reports his baseline weight is around 200.  No leg edema no shortness of breath.  Monitor Net IO Since Admission: 16,120.96 mL [01/21/23 0822]  Filed Weights   01/17/23 0408 01/20/23 2005 01/21/23 0127  Weight: 97.5 kg 101.3 kg 101.3 kg    DVT prophylaxis: enoxaparin (LOVENOX) injection 40 mg Start: 01/15/23 1200 SCDs Start: 01/12/23 2346 Code Status:   Code Status: Full Code Family Communication: plan of care discussed with patient/no family at bedside I had updated patient's wife at the bedside previously.  Family were updated at bedside 1/25 evening  Patient status is: Inpatient because of DKA/severe electrolyte derangement and encephalopathy Level of care: Med-Surg  Dispo: The patient is from:Home            Anticipated disposition: home in 1 day Objective: Vitals last 24 hrs: Vitals:   01/20/23 1500 01/20/23 2005 01/21/23 0127 01/21/23 0406  BP: 121/76 117/79  (!) 133/90  Pulse: (!) 105 94  92  Resp: 16 18  17   Temp: 99.9 F (37.7 C) 98.8 F (37.1 C)  98.7 F (37.1 C)  TempSrc: Oral Oral  Oral  SpO2: 100% 99%  100%  Weight:  101.3 kg 101.3 kg   Height:       Weight change:   Physical Examination: General exam: alert awake, oriented, older than stated age HEENT:Oral mucosa moist, Ear/Nose WNL grossly Respiratory system: Bilaterally clear BS, no use of accessory muscle Cardiovascular system: S1 & S2 +, No  JVD. Gastrointestinal system: Abdomen soft, some distention present but nontender BS+ Nervous System: Alert, awake, moving extremities, nonfocal, follows commands. Extremities: LE edema neg,distal peripheral pulses palpable.  Skin: No rashes,no icterus. MSK: Normal muscle bulk,tone, power   Medications reviewed:  Scheduled Meds:  Chlorhexidine Gluconate Cloth  6 each Topical Daily   enoxaparin (LOVENOX) injection  40 mg  Subcutaneous Q24H   feeding supplement  237 mL Oral BID BM   folic acid  1 mg Oral Daily   insulin aspart  0-15 Units Subcutaneous Q4H   insulin detemir  40 Units Subcutaneous Daily   multivitamin with minerals  1 tablet Oral Daily   potassium chloride  40 mEq Oral Once   sodium chloride flush  10-40 mL Intracatheter Q12H   thiamine  100 mg Oral Daily   Continuous Infusions:  potassium chloride      Diet Order             DIET SOFT Room service appropriate? Yes; Fluid consistency: Thin  Diet effective now                   Intake/Output Summary (Last 24 hours) at 01/21/2023 0822 Last data filed at 01/21/2023 0550 Gross per 24 hour  Intake 240 ml  Output 250 ml  Net -10 ml    Net IO Since Admission: 16,120.96 mL [01/21/23 0822]  Wt Readings from Last 3 Encounters:  01/21/23 101.3 kg  07/31/20 88.5 kg  09/27/19 88.5 kg     Unresulted Labs (From admission, onward)     Start     Ordered   01/20/23 0500  Basic metabolic panel  Daily,   R     Question:  Specimen collection method  Answer:  Unit=Unit collect   01/19/23 1448   01/20/23 0500  CBC  Daily,   R     Question:  Specimen collection method  Answer:  Unit=Unit collect   01/19/23 1448   01/13/23 0500  CBC with Differential  Tomorrow morning,   R        01/12/23 2343          Data Reviewed: I have personally reviewed following labs and imaging studies CBC: Recent Labs  Lab 01/17/23 0431 01/18/23 0400 01/19/23 0415 01/20/23 0438 01/21/23 0413  WBC 16.0* 17.6* 11.2* 11.6* 10.9*  HGB 11.4* 9.8* 8.2* 8.4* 7.6*  HCT 35.5* 31.2* 27.0* 26.3* 24.1*  MCV 99.4 102.3* 103.8* 101.2* 101.7*  PLT 134* 149* 142* 151 162    Basic Metabolic Panel: Recent Labs  Lab 01/14/23 1551 01/15/23 0051 01/15/23 0439 01/15/23 0838 01/15/23 2050 01/16/23 0009 01/17/23 0431 01/18/23 0400 01/19/23 0415 01/20/23 0438 01/21/23 0413  NA  --    < > 145   < > 143   < > 144 145 145 140 139  K  --    < > 5.0   < > 3.2*   <  > 3.6 3.6 3.4* 3.0* 3.1*  CL  --    < > 119*   < > 118*   < > 117* 116* 117* 111 109  CO2  --    < > 19*   < > 19*   < > 21* 23 23 22 22   GLUCOSE  --    < > 159*   < > 179*   < > 164* 154* 207* 138* 102*  BUN  --    < > 20   < > 19   < > 26*  24* 21* 12 11  CREATININE  --    < > 1.01   < > 0.99   < > 0.92 0.92 0.94 0.91 0.84  CALCIUM  --    < > 8.0*   < > 7.7*   < > 7.8* 8.2* 7.7* 7.6* 7.7*  MG  --   --   --   --  1.5*  --   --   --   --   --   --   PHOS 2.9  --  2.7  --   --   --   --   --   --   --   --    < > = values in this interval not displayed.    GFR: Estimated Creatinine Clearance: 114.9 mL/min (by C-G formula based on SCr of 0.84 mg/dL). Liver Function Tests: Recent Labs  Lab 01/15/23 0439 01/16/23 0447 01/17/23 0431 01/18/23 0400 01/19/23 0415  AST 74* 47* 45* 43* 35  ALT 42 37 35 35 32  ALKPHOS 45 50 55 52 50  BILITOT 1.0 1.4* 2.0* 2.3* 1.3*  PROT 6.1* 6.0* 6.1* 5.9* 5.3*  ALBUMIN 2.6* 2.6* 2.7* 2.8* 2.5*    Recent Labs  Lab 01/15/23 0439  LIPASE 147*    Recent Labs  Lab 01/15/23 1001 01/16/23 0447  AMMONIA 54* 18    Recent Results (from the past 240 hour(s))  Blood Culture (routine x 2)     Status: None   Collection Time: 01/12/23  2:23 PM   Specimen: Left Antecubital; Blood  Result Value Ref Range Status   Specimen Description   Final    LEFT ANTECUBITAL BLOOD Performed at [facility] [place], [address] [place], [place] [address]    Special Requests   Final    Blood Culture adequate volume BOTTLES DRAWN AEROBIC AND ANAEROBIC Performed at [facility] [place], [address] [place], [place] [address]    Culture   Final    NO GROWTH 5 DAYS Performed at [facility], [address] [place], [place] [address]    Report Status 01/17/2023 FINAL  Final  Resp panel by RT-PCR (RSV, Flu A&B, Covid) Anterior Nasal Swab     Status: None   Collection Time: 01/12/23  2:30 PM   Specimen: Anterior Nasal Swab  Result Value Ref Range  Status   SARS Coronavirus 2 by RT PCR NEGATIVE NEGATIVE Final    Comment: (NOTE) SARS-CoV-2 target nucleic acids are NOT DETECTED.  The SARS-CoV-2 RNA is generally detectable in upper respiratory specimens during the acute phase of infection. The lowest concentration of SARS-CoV-2 viral copies this assay can detect is 138 copies/mL. A negative result does not preclude SARS-Cov-2 infection and should not be used as the sole basis for treatment or other patient management decisions. A negative result may occur with  improper specimen collection/handling, submission of specimen other than nasopharyngeal swab, presence of viral mutation(s) within the areas targeted by this assay, and inadequate number of viral copies(<138 copies/mL). A negative result must be combined with clinical observations, patient history, and epidemiological information. The expected result is Negative.  Fact Sheet for Patients:  BloggerCourse.comhttps://www.fda.gov/media/152166/download  Fact Sheet for Healthcare Providers:  SeriousBroker.ithttps://www.fda.gov/media/152162/download  This test is no t yet approved or cleared by the [place] FDA and  has been authorized for detection and/or diagnosis of SARS-CoV-2 by FDA under an Emergency Use Authorization (EUA). This EUA will remain  in effect (meaning this test  can be used) for the duration of the COVID-19 declaration under Section 564(b)(1) of the Act, 21 U.S.C.section 360bbb-3(b)(1), unless the authorization is terminated  or revoked sooner.       Influenza A by PCR NEGATIVE NEGATIVE Final   Influenza B by PCR NEGATIVE NEGATIVE Final    Comment: (NOTE) The Xpert Xpress SARS-CoV-2/FLU/RSV plus assay is intended as an aid in the diagnosis of influenza from Nasopharyngeal swab specimens and should not be used as a sole basis for treatment. Nasal washings and aspirates are unacceptable for Xpert Xpress SARS-CoV-2/FLU/RSV testing.  Fact Sheet for  Patients: BloggerCourse.com  Fact Sheet for Healthcare Providers: SeriousBroker.it  This test is not yet approved or cleared by the [place] FDA and has been authorized for detection and/or diagnosis of SARS-CoV-2 by FDA under an Emergency Use Authorization (EUA). This EUA will remain in effect (meaning this test can be used) for the duration of the COVID-19 declaration under Section 564(b)(1) of the Act, 21 U.S.C. section 360bbb-3(b)(1), unless the authorization is terminated or revoked.     Resp Syncytial Virus by PCR NEGATIVE NEGATIVE Final    Comment: (NOTE) Fact Sheet for Patients: BloggerCourse.com  Fact Sheet for Healthcare Providers: SeriousBroker.it  This test is not yet approved or cleared by the [place] FDA and has been authorized for detection and/or diagnosis of SARS-CoV-2 by FDA under an Emergency Use Authorization (EUA). This EUA will remain in effect (meaning this test can be used) for the duration of the COVID-19 declaration under Section 564(b)(1) of the Act, 21 U.S.C. section 360bbb-3(b)(1), unless the authorization is terminated or revoked.  Performed at [facility], [address] [place], [place] [address]   Blood Culture (routine x 2)     Status: None   Collection Time: 01/12/23  2:37 PM   Specimen: BLOOD RIGHT HAND  Result Value Ref Range Status   Specimen Description   Final    BLOOD RIGHT HAND BLOOD Performed at [facility], [address] [place], [place] [address]    Special Requests   Final    Blood Culture adequate volume BOTTLES DRAWN AEROBIC AND ANAEROBIC Performed at [facility], [address] [place], [place] [address]    Culture   Final    NO GROWTH 5 DAYS Performed at [facility], [address], [place], [place] [address]    Report Status 01/17/2023 FINAL  Final  Urine Culture      Status: None   Collection Time: 01/12/23  4:43 PM   Specimen: In/Out Cath Urine  Result Value Ref Range Status   Specimen Description   Final    IN/OUT CATH URINE Performed at [facility], [address] [place], [place] [address]    Special Requests   Final    NONE Performed at [facility], [address] [place], [place] [address]    Culture   Final    NO GROWTH Performed at [facility], [address] [place], [place] [address]    Report Status 01/13/2023 FINAL  Final  MRSA Next Gen by PCR, Nasal     Status: None   Collection Time: 01/12/23 11:06 PM  Result Value Ref Range Status   MRSA by PCR Next Gen NOT DETECTED NOT DETECTED Final    Comment: (NOTE) The GeneXpert MRSA Assay (FDA approved for NASAL specimens only), is one component of a comprehensive MRSA colonization  surveillance program. It is not intended to diagnose MRSA infection nor to guide or monitor treatment for MRSA infections. Test performance is not FDA approved in patients less than 41 years old. Performed at [facility], [place] [address] [place], [place] [address]   Respiratory (~20 pathogens) panel by PCR     Status: None   Collection Time: 01/12/23 11:52 PM   Specimen: Nasopharyngeal Swab; Respiratory  Result Value Ref Range Status   Adenovirus NOT DETECTED NOT DETECTED Final   Coronavirus 229E NOT DETECTED NOT DETECTED Final    Comment: (NOTE) The Coronavirus on the Respiratory Panel, DOES NOT test for the novel  Coronavirus (2019 nCoV)    Coronavirus HKU1 NOT DETECTED NOT DETECTED Final   Coronavirus NL63 NOT DETECTED NOT DETECTED Final   Coronavirus OC43 NOT DETECTED NOT DETECTED Final   Metapneumovirus NOT DETECTED NOT DETECTED Final   Rhinovirus / Enterovirus NOT DETECTED NOT DETECTED Final   Influenza A NOT DETECTED NOT DETECTED Final   Influenza B NOT DETECTED NOT DETECTED Final   Parainfluenza Virus 1 NOT DETECTED NOT DETECTED Final    Parainfluenza Virus 2 NOT DETECTED NOT DETECTED Final   Parainfluenza Virus 3 NOT DETECTED NOT DETECTED Final   Parainfluenza Virus 4 NOT DETECTED NOT DETECTED Final   Respiratory Syncytial Virus NOT DETECTED NOT DETECTED Final   Bordetella pertussis NOT DETECTED NOT DETECTED Final   Bordetella Parapertussis NOT DETECTED NOT DETECTED Final   Chlamydophila pneumoniae NOT DETECTED NOT DETECTED Final   Mycoplasma pneumoniae NOT DETECTED NOT DETECTED Final    Comment: Performed at [facility], [place]. [address] [place], [place] [address]  Antimicrobials: Anti-infectives (From admission, onward)    Start     Dose/Rate Route Frequency Ordered Stop   01/13/23 1800  vancomycin (VANCOREADY) IVPB 1500 mg/300 mL  Status:  Discontinued        1,500 mg 150 mL/hr over 120 Minutes Intravenous Every 24 hours 01/13/23 1404 01/15/23 0817   01/13/23 0100  ceFEPIme (MAXIPIME) 2 g in sodium chloride 0.9 % 100 mL IVPB  Status:  Discontinued        2 g 200 mL/hr over 30 Minutes Intravenous Every 12 hours 01/13/23 0002 01/15/23 0837   01/13/23 0030  metroNIDAZOLE (FLAGYL) IVPB 500 mg  Status:  Discontinued        500 mg 100 mL/hr over 60 Minutes Intravenous Every 12 hours 01/12/23 2343 01/15/23 0837   01/12/23 1740  vancomycin variable dose per unstable renal function (pharmacist dosing)  Status:  Discontinued         Does not apply See admin instructions 01/12/23 1740 01/13/23 1406   01/12/23 1630  vancomycin (VANCOCIN) 500 mg in sodium chloride 0.9 % 100 mL IVPB       See Hyperspace for full Linked Orders Report.   500 mg 100 mL/hr over 60 Minutes Intravenous  Once 01/12/23 1523 01/12/23 1818   01/12/23 1530  vancomycin (VANCOCIN) IVPB 1000 mg/200 mL premix       See Hyperspace for full Linked Orders Report.   1,000 mg 200 mL/hr over 60 Minutes Intravenous  Once 01/12/23 1523 01/12/23 1652   01/12/23 1500  piperacillin-tazobactam (ZOSYN) IVPB 3.375 g        3.375 g 100 mL/hr over 30 Minutes  Intravenous  Once 01/12/23 1450 01/12/23 1530      Culture/Microbiology    Component Value Date/Time   SDES  01/12/2023 1643    IN/OUT CATH URINE Performed at  [facility], [address] [place], [place] [address]    [facility]  01/12/2023 1643    NONE Performed at [facility], [address] [place], [place] [address]    CULT  01/12/2023 1643    NO GROWTH Performed at [facility], [address], [place], [place] [address]    REPTSTATUS 01/13/2023 FINAL 01/12/2023 1643  Other culture-see note  Radiology Studies: No results found.   LOS: 9 days   Lanae Boast, MD [facility]  01/21/2023, 8:22 AM

## 2023-01-22 DIAGNOSIS — E1110 Type 2 diabetes mellitus with ketoacidosis without coma: Secondary | ICD-10-CM | POA: Diagnosis not present

## 2023-01-22 DIAGNOSIS — I10 Essential (primary) hypertension: Secondary | ICD-10-CM

## 2023-01-22 DIAGNOSIS — G9341 Metabolic encephalopathy: Secondary | ICD-10-CM

## 2023-01-22 DIAGNOSIS — N179 Acute kidney failure, unspecified: Secondary | ICD-10-CM | POA: Diagnosis not present

## 2023-01-22 DIAGNOSIS — E870 Hyperosmolality and hypernatremia: Secondary | ICD-10-CM

## 2023-01-22 DIAGNOSIS — K567 Ileus, unspecified: Secondary | ICD-10-CM | POA: Diagnosis not present

## 2023-01-22 DIAGNOSIS — R6510 Systemic inflammatory response syndrome (SIRS) of non-infectious origin without acute organ dysfunction: Secondary | ICD-10-CM

## 2023-01-22 LAB — CBC
HCT: 23 % — ABNORMAL LOW (ref 39.0–52.0)
Hemoglobin: 7.3 g/dL — ABNORMAL LOW (ref 13.0–17.0)
MCH: 32.2 pg (ref 26.0–34.0)
MCHC: 31.7 g/dL (ref 30.0–36.0)
MCV: 101.3 fL — ABNORMAL HIGH (ref 80.0–100.0)
Platelets: 158 10*3/uL (ref 150–400)
RBC: 2.27 MIL/uL — ABNORMAL LOW (ref 4.22–5.81)
RDW: 13.2 % (ref 11.5–15.5)
WBC: 9.1 10*3/uL (ref 4.0–10.5)
nRBC: 1.4 % — ABNORMAL HIGH (ref 0.0–0.2)

## 2023-01-22 LAB — BASIC METABOLIC PANEL
Anion gap: 6 (ref 5–15)
BUN: 9 mg/dL (ref 6–20)
CO2: 22 mmol/L (ref 22–32)
Calcium: 7.8 mg/dL — ABNORMAL LOW (ref 8.9–10.3)
Chloride: 112 mmol/L — ABNORMAL HIGH (ref 98–111)
Creatinine, Ser: 0.86 mg/dL (ref 0.61–1.24)
GFR, Estimated: >60 mL/min (ref >60)
Glucose, Bld: 118 mg/dL — ABNORMAL HIGH (ref 70–99)
Potassium: 3.4 mmol/L — ABNORMAL LOW (ref 3.5–5.1)
Sodium: 140 mmol/L (ref 135–145)

## 2023-01-22 LAB — GLUCOSE, CAPILLARY
Glucose-Capillary: 108 mg/dL — ABNORMAL HIGH (ref 70–99)
Glucose-Capillary: 116 mg/dL — ABNORMAL HIGH (ref 70–99)
Glucose-Capillary: 120 mg/dL — ABNORMAL HIGH (ref 70–99)
Glucose-Capillary: 127 mg/dL — ABNORMAL HIGH (ref 70–99)
Glucose-Capillary: 148 mg/dL — ABNORMAL HIGH (ref 70–99)

## 2023-01-22 LAB — MAGNESIUM: Magnesium: 1.7 mg/dL (ref 1.7–2.4)

## 2023-01-22 MED ORDER — ACETAMINOPHEN 325 MG PO TABS: 650.0000 mg | ORAL_TABLET | Freq: Four times a day (QID) | ORAL | Status: AC | PRN

## 2023-01-22 MED ORDER — VITAMIN B-1 100 MG PO TABS: 100.0000 mg | ORAL_TABLET | Freq: Every day | ORAL | Status: AC

## 2023-01-22 MED ORDER — MAGNESIUM SULFATE 2 GM/50ML IV SOLN
2.0000 g | Freq: Once | INTRAVENOUS | Status: AC
  Administered 2023-01-22: 2 g via INTRAVENOUS
  Filled 2023-01-22: qty 50

## 2023-01-22 MED ORDER — FOLIC ACID 1 MG PO TABS: 1.0000 mg | ORAL_TABLET | Freq: Every day | ORAL | Status: AC

## 2023-01-22 MED ORDER — ADULT MULTIVITAMIN W/MINERALS CH: 1.0000 | ORAL_TABLET | Freq: Every day | ORAL | Status: AC

## 2023-01-22 MED ORDER — ENSURE ENLIVE PO LIQD: 237.0000 mL | Freq: Two times a day (BID) | ORAL | 12 refills | Status: AC

## 2023-01-22 MED ORDER — HYDRALAZINE HCL 25 MG PO TABS: 25.0000 mg | ORAL_TABLET | Freq: Two times a day (BID) | ORAL | 2 refills | Status: AC

## 2023-01-22 MED ORDER — POTASSIUM CHLORIDE CRYS ER 20 MEQ PO TBCR
40.0000 meq | EXTENDED_RELEASE_TABLET | Freq: Once | ORAL | Status: AC
  Administered 2023-01-22: 40 meq via ORAL
  Filled 2023-01-22: qty 2

## 2023-01-22 MED ORDER — PANTOPRAZOLE SODIUM 40 MG PO TBEC
40.0000 mg | DELAYED_RELEASE_TABLET | Freq: Every day | ORAL | Status: DC
  Administered 2023-01-22: 40 mg via ORAL
  Filled 2023-01-22: qty 1

## 2023-01-22 MED ORDER — BLOOD GLUCOSE METER KIT: PACK | 0 refills | Status: AC

## 2023-01-22 MED ORDER — "PEN NEEDLES 3/16"" 31G X 5 MM MISC": 24.0000 [IU] | Freq: Two times a day (BID) | 0 refills | Status: AC

## 2023-01-22 MED ORDER — PANTOPRAZOLE SODIUM 40 MG PO TBEC: 40.0000 mg | DELAYED_RELEASE_TABLET | Freq: Every day | ORAL | 1 refills | Status: AC

## 2023-01-22 MED ORDER — NOVOLIN R FLEXPEN RELION 100 UNIT/ML IJ SOPN: 24.0000 [IU] | PEN_INJECTOR | Freq: Two times a day (BID) | INTRAMUSCULAR | 2 refills | Status: AC

## 2023-01-22 MED ORDER — VITAMIN D (ERGOCALCIFEROL) 1.25 MG (50000 UNIT) PO CAPS: 50000.0000 [IU] | ORAL_CAPSULE | ORAL | 1 refills | Status: AC

## 2023-01-22 MED ORDER — AMLODIPINE BESYLATE 10 MG PO TABS: 10.0000 mg | ORAL_TABLET | Freq: Every day | ORAL | 11 refills | Status: AC

## 2023-01-22 NOTE — Plan of Care (Signed)
  Problem: Education: Goal: Knowledge of General Education information will improve Description: Including pain rating scale, medication(s)/side effects and non-pharmacologic comfort measures Outcome: Progressing   Problem: Health Behavior/Discharge Planning: Goal: Ability to manage health-related needs will improve Outcome: Progressing   Problem: Clinical Measurements: Goal: Ability to maintain clinical measurements within normal limits will improve Outcome: Progressing Goal: Will remain free from infection Outcome: Progressing Goal: Diagnostic test results will improve Outcome: Progressing Goal: Respiratory complications will improve Outcome: Progressing Goal: Cardiovascular complication will be avoided Outcome: Progressing   Problem: Activity: Goal: Risk for activity intolerance will decrease Outcome: Progressing   Problem: Coping: Goal: Level of anxiety will decrease Outcome: Progressing   Problem: Elimination: Goal: Will not experience complications related to bowel motility Outcome: Progressing Goal: Will not experience complications related to urinary retention Outcome: Progressing   Problem: Pain Managment: Goal: General experience of comfort will improve Outcome: Progressing   Problem: Safety: Goal: Ability to remain free from injury will improve Outcome: Progressing   Problem: Skin Integrity: Goal: Risk for impaired skin integrity will decrease Outcome: Progressing   Problem: Fluid Volume: Goal: Hemodynamic stability will improve Outcome: Progressing   Problem: Clinical Measurements: Goal: Diagnostic test results will improve Outcome: Progressing Goal: Signs and symptoms of infection will decrease Outcome: Progressing   Problem: Respiratory: Goal: Ability to maintain adequate ventilation will improve Outcome: Progressing   Problem: Education: Goal: Ability to describe self-care measures that may prevent or decrease complications (Diabetes  Survival Skills Education) will improve Outcome: Progressing Goal: Individualized Educational Video(s) Outcome: Progressing   Problem: Coping: Goal: Ability to adjust to condition or change in health will improve Outcome: Progressing   Problem: Fluid Volume: Goal: Ability to maintain a balanced intake and output will improve Outcome: Progressing   Problem: Health Behavior/Discharge Planning: Goal: Ability to identify and utilize available resources and services will improve Outcome: Progressing Goal: Ability to manage health-related needs will improve Outcome: Progressing   Problem: Metabolic: Goal: Ability to maintain appropriate glucose levels will improve Outcome: Progressing   Problem: Nutritional: Goal: Maintenance of adequate nutrition will improve Outcome: Progressing Goal: Progress toward achieving an optimal weight will improve Outcome: Progressing   Problem: Skin Integrity: Goal: Risk for impaired skin integrity will decrease Outcome: Progressing   Problem: Tissue Perfusion: Goal: Adequacy of tissue perfusion will improve Outcome: Progressing   Problem: Education: Goal: Ability to describe self-care measures that may prevent or decrease complications (Diabetes Survival Skills Education) will improve Outcome: Progressing Goal: Individualized Educational Video(s) Outcome: Progressing   Problem: Cardiac: Goal: Ability to maintain an adequate cardiac output will improve Outcome: Progressing   Problem: Health Behavior/Discharge Planning: Goal: Ability to identify and utilize available resources and services will improve Outcome: Progressing Goal: Ability to manage health-related needs will improve Outcome: Progressing   Problem: Fluid Volume: Goal: Ability to achieve a balanced intake and output will improve Outcome: Progressing   Problem: Metabolic: Goal: Ability to maintain appropriate glucose levels will improve Outcome: Progressing   Problem:  Nutritional: Goal: Maintenance of adequate nutrition will improve Outcome: Progressing Goal: Maintenance of adequate weight for body size and type will improve Outcome: Progressing   Problem: Respiratory: Goal: Will regain and/or maintain adequate ventilation Outcome: Progressing   Problem: Urinary Elimination: Goal: Ability to achieve and maintain adequate renal perfusion and functioning will improve Outcome: Progressing   Problem: Safety: Goal: Non-violent Restraint(s) Outcome: Progressing

## 2023-01-22 NOTE — Discharge Summary (Signed)
Physician Discharge Summary  Vincent Thompson:[id number] DOB: 08-19-1966 DOA: 01/12/2023  PCP: [name], MD  Admit date: 01/12/2023 Discharge date: 01/22/2023  Time spent: 60 minutes  Recommendations for Outpatient Follow-up:  Follow-up with [name], MD in 1 to 2 weeks.  On follow-up patient's diabetes will need to be reassessed.  Patient will need a basic metabolic profile, magnesium level, phosphorus level done to follow-up on electrolytes and renal function.  Vitamin D levels will need to be followed up upon.  Patient's blood pressure will need to be reassessed as patient's ACE inhibitor and HCTZ was discontinued during this hospitalization.   Discharge Diagnoses:  Principal Problem:   Ileus ([place]) Active Problems:   DKA (diabetic ketoacidosis) ([facility])   AKI (acute kidney injury) ([place])   Pancreatitis   Hypercalcemia   Elevated LFTs   SIRS (systemic inflammatory response syndrome) ([facility])   Acute metabolic encephalopathy   Hypernatremia   Essential hypertension   Discharge Condition: Stable and improved.  Diet recommendation: Carb modified diet  Filed Weights   01/17/23 0408 01/20/23 2005 01/21/23 0127  Weight: 97.5 kg 101.3 kg 101.3 kg    History of present illness:  HPI per [name] is a 57 y.o. male with medical history significant of HTN, elevated LFT, anxiety, HLD     Presented with   confusion and lethargy Reports cough and URI for 2 wks Today lethargic, tested negative for COVID 1 wk ago endorses confusion. At first he was getting a bit better yesterday but then started to get again more lethargic and tired.  That is what triggered him to go to emergency department.  Otherwise wife is unsure about any abdominal pain or dysuria he still has some cough.   Found to have severely elevated blood sugar and lipase Patient was not aware that he is diabetic   Wife states he stopped urinating but before that he was drinking a lot of  fluids He drinks on occasion 3-5 drinks a week  Does not smoke He was told that he is prediabetic Family hx of DM in mom and sister   He has been drinking a lot of orange juice trying to self treat with vitamin C    Initial COVID TEST  NEGATIVE   Hospital Course:  Brief Narrative/Hospital Course: 57 year old male with hypertension elevated LFTs anxiety hyperlipidemia who has been having cough and upper respiratory symptoms for 2 and half weeks-had tested negative for COVID a week ago, presented with lethargy/confusion, tired. In the ED low-grade fever 100.2 tachycardic in the 120s, stable BP and oxygen saturation Labs obtained that showed blood sugar more than 1200, hyperkalemia, AKI w/ creatinine 3.6, transaminitis lactic acidosis 5.9 leukocytosis 18.7 BHB more than 8 COVID-19 influenza negative, lipase elevated 1194,CK4 39 troponin 44.  Normal folate and elevated B12, iron 39 but ferritin high at 2484. Imaging: Chest x-ray low lung volumes, CT chest abdomen abdomen pelvis w/o: Mild stranding/edema of pancreas concerning for pancreatitis, likely reactive/secondary duodenitis, hepatic steatosis, 2.6 cm left renal lesion. RVP panel pending Patient was aggressively volume resuscitated with 2 L bolus, was given vancomycin and Zosyn, placed on IV insulin drip, cefepime Flagyl and admitted for further management Critical care Dr Tacy Learn was consulted but advised stable for [facility] admission Due to severe electrolyte imbalance nephrology was consulted Patient managed aggressive normal saline along with D5W insulin drip phosphorus and potassium replacement.  Electrolytes had significantly improved but patient remained confused with pancreatitis distended abdomen x-ray 1/24 showed ileus.  Seen by critical care. 1/25: Much less confused, passing gas and had bowel movement. 1/27: Ongoing persistent leukocytosis abdominal distention, CT abdomen obtained with IV contrast showing fluid-filled small at this time  and also in the colon seen by [name]- no concern for bowel obstruction advised to advance diet. 1/28: Transitioned to subcu insulin regimen.  T2DM-new onset w/ hx of prediabetes w/ uncontrolled hba1c at 13 w/ uncontrolled hyperglycemia >1200  DKA on presentation-resolved now: Patient noted to have been admitted with DKA on presentation, placed on aggressive fluid resuscitation, Endo tool, had serial labs done, seen by diabetic coordinator during the hospitalization.  -On admission patient noted to have blood glucose level >1200, noted to have an anion gap of 33, bicarb of 13, sodium of 133.  Urinalysis done with glycosuria, 40 ketones.  -Patient admitted aggressively hydrated placed on 922 and improved clinically during the hospitalization.  - 1/28: Transitioned to subcu insulin regimen.as well as sliding scale insulin and long-acting insulin dose adjusted.  Patient followed by diabetic coordinator.   -9 show issues it was recommended patient be discharged on 70/3024 units twice daily which patient will be discharged on.   -Patient underwent diabetes education/teaching.   -Outpatient follow-up with PCP.   Last Labs         Recent Labs  Lab 01/20/23 1613 01/20/23 2009 01/20/23 2336 01/21/23 0401 01/21/23 0731  GLUCAP 302* 231* 179* 103* 96       Ileus with abdominal distention nausea and vomiting: Patient noted to have had abdominal distention since 1/24 x-ray showing ileus. 1/27: Ongoing persistent leukocytosis abdominal distention, CT abdomen obtained with IV contrast showing fluid-filled small at this time and also in the colon seen by [name], with general surgery who had no concern for bowel obstruction and recommended diet advancement. -Diet was advanced which patient tolerated, patient started to have bowel movements, was passing gas, abdominal distention slowly improved during the hospitalization. -Outpatient follow-up.   Acute Pancreatitis:lipase [phone]: CT  chest abdomen pelvis in ED and repeat 1/27 ongoing acute pancreatitis no necrosis pseudocyst or other complication.likely EtOH induced.TG level normal.Ultrasound 1/24 hepatic steatosis no other acute finding. -Patient hydrated aggressively with IV fluids, patient seen in consultation by GI who recommended conservative management at this time. -Patient improved clinically, started on a clear liquid diet and diet advanced to a soft diet which she tolerated. -Patient had no further abdominal pain by day of discharge. -Patient will be discharged in stable and improved condition.   History of alcohol use 3-5 drinks per week Alcoholic hepatitis/abnormal LFTs/possible alcoholic fatty liver disease: Suspect he had withdrawal with delerium.during the hospitalization, managed with CIWA scale Ativan, thiamine folate/multivitamin.  Ethanol level negative drug screen negative.   -Cessation stressed to patient.   -Outpatient follow-up.    Acute metabolic encephalopathy in the setting of patient's DKA/SIRS possible alcohol withdrawal delirium: Patient's DKA was managed with Endo tool, patient aggressively hydrated with IV fluids, patient did receive empiric IV antibiotics which was subsequently discontinued.  Head CT done was negative for any acute abnormalities.  Patient's status post high-dose thiamine during the hospitalization.  Patient improved clinically and was back to baseline by day of discharge.   AKI: 2/2 volume depletion with DKA in the setting of ACE inhibitor and HCTZ. -Patient seen in consultation by nephrology during the hospitalization, ACE inhibitor HCTZ discontinued. -Renal ultrasound done was negative for hydronephrosis. -Urine sodium done during the hospitalization noted at < 10. -Patient hydrated aggressively with IV fluids with resolution of  acute kidney injury by day of discharge. -ACE inhibitor and HCTZ were discontinued on discharge. -Outpatient follow-up with PCP.   Severe  dehydration with DKA with free water deficit: Resolved with aggressive IVF Hypernatremia:resolved w/ ivf.  Nephro signed off.  IV fluids discontinued.  Hyponatremia had resolved by day of discharge. Hypophosphatemia:In the setting of DKA/insulin [id number]. Hypercalcemia-in the setting of severe dehydration: PTH appropriately suppressed, ca normal.  Improved with hydration.  Resolved by day of discharge. Hypokalemia: Patient noted to have significant hypokalemia which was likely multifactorial from DKA in the setting of diuretics of HCTZ.  HCTZ was held.  Patient's potassium was aggressively repleted.  Magnesium was also checked and repleted.  -HCTZ discontinued on discharge.   -Outpatient follow-up with PCP.   Last Labs               Recent Labs  Lab 01/14/23 1551 01/15/23 0051 01/15/23 0439 01/15/23 [id number] 01/15/23 2050 01/16/23 0009 01/17/23 0431 01/18/23 0400 01/19/23 0415 01/20/23 0438 01/21/23 0413  K  --    < > 5.0   < > 3.2*   < > 3.6 3.6 3.4* 3.0* 3.1*  CALCIUM  --    < > 8.0*   < > 7.7*   < > 7.8* 8.2* 7.7* 7.6* 7.7*  MG  --   --   --   --  1.5*  --   --   --   --   --   --   PHOS 2.9  --  2.7  --   --   --   --   --   --   --   --    < > = values in this interval not displayed.       Vitamin D Deficiency at 19.5//calcitriol low as well: Patient subsequently started on high-dose vitamin D replacement weekly and will follow-up with PCP in the outpatient setting.   Rhabdomyolysis: Nontraumatic,CK improved with aggressive hydration with IV fluids on presentation. Elevated troponin: Patient without chest pain likely demand ischemia in the setting of severe electrolyte imbalance.  Trops flat/no further cardiac workup done.   SIRS 2/2 pancreatitis Leucocytosis Lactic acidosis: +procal 3.7>Cxr,CT chest on pelvis  UA> without evidence of infection - 2/2 patient's dehydration/dka/pancreatitis> blood culture negative > off antibiotics 1/24.  WBC improved with resolution of  leukocytosis by day of discharge.   -Patient improved clinically and be discharged in stable and improved condition.       Procedures: PICC line placement 01/15/2023 CT head 01/12/2023 CT abdomen and pelvis 01/18/2023 Chest x-ray 01/12/2023, 01/15/2023 Abdominal 01/15/2023, 01/17/2023 Abdominal ultrasound 01/15/2023 Renal ultrasound 01/13/2023  Consultations: Gastroenterology: Dr. Randel Pigg 01/13/2023. Nephrology: Dr. Jonnie Finner 01/13/2023 PCCM: Dr. Tamala Julian 01/15/2023 General surgery: Dr. Kieth Brightly 01/18/2023  Discharge Exam: Vitals:   01/22/23 0540 01/22/23 1319  BP: 124/83 126/77  Pulse: 90 84  Resp: 17 18  Temp: 98 F (36.7 C) 97.9 F (36.6 C)  SpO2: 98% 99%    General: NAD Cardiovascular: Regular rate rhythm no murmurs rubs or gallops.  No JVD.  No lower extremity edema. Respiratory: Lungs clear to auscultation bilaterally.  No wheezes, no crackles, no rhonchi.  Fair air movement.  Speaking in full sentences.  Discharge Instructions   Discharge Instructions     Diet Carb Modified   Complete by: As directed    Increase activity slowly   Complete by: As directed       Allergies as of 01/22/2023   No Known Allergies  Medication List     STOP taking these medications    amLODipine-benazepril 10-40 MG capsule Commonly known as: LOTREL   hydrochlorothiazide 25 MG tablet Commonly known as: HYDRODIURIL       TAKE these medications    acetaminophen 325 MG tablet Commonly known as: TYLENOL Take 2 tablets (650 mg total) by mouth every 6 (six) hours as needed for moderate pain or mild pain.   albuterol 108 (90 Base) MCG/ACT inhaler Commonly known as: VENTOLIN HFA Inhale 2 puffs into the lungs every 4 (four) hours as needed for wheezing or shortness of breath.   amLODipine 10 MG tablet Commonly known as: NORVASC Take 1 tablet (10 mg total) by mouth daily.   blood glucose meter kit and supplies Dispense based on patient and insurance preference. Use up to four  times daily as directed. (FOR ICD-10 E10.9, E11.9).   escitalopram 20 MG tablet Commonly known as: LEXAPRO Take 1 tablet by mouth daily.   feeding supplement Liqd Take 237 mLs by mouth 2 (two) times daily between meals. Start taking on: January 23, 1609   folic acid 1 MG tablet Commonly known as: FOLVITE Take 1 tablet (1 mg total) by mouth daily. Start taking on: January 23, 2023   Glucosamine 500 MG Caps Take 1 tablet by mouth daily.   hydrALAZINE 25 MG tablet Commonly known as: APRESOLINE Take 1 tablet (25 mg total) by mouth 2 (two) times daily.   multivitamin with minerals Tabs tablet Take 1 tablet by mouth daily. Start taking on: January 23, 2023   NovoLIN R FlexPen ReliOn 100 UNIT/ML KwikPen Generic drug: Insulin Regular Human Inject 24 Units as directed 2 (two) times daily.   pantoprazole 40 MG tablet Commonly known as: PROTONIX Take 1 tablet (40 mg total) by mouth daily at 6 (six) AM. Start taking on: January 23, 2023   Pen Needles 3/16" 31G X 5 MM Misc 24 Units by Does not apply route 2 (two) times daily.   simvastatin 20 MG tablet Commonly known as: ZOCOR Take 20 mg by mouth every morning.   thiamine 100 MG tablet Commonly known as: Vitamin B-1 Take 1 tablet (100 mg total) by mouth daily. Start taking on: January 23, 2023   vitamin B-12 100 MCG tablet Commonly known as: CYANOCOBALAMIN Take 100 mcg by mouth.   Vitamin D (Ergocalciferol) 1.25 MG (50000 UNIT) Caps capsule Commonly known as: DRISDOL Take 1 capsule (50,000 Units total) by mouth every 7 (seven) days. Start taking on: January 28, 2023       No Known Allergies  Follow-up Information     [name], MD. Schedule an appointment as soon as possible for a visit in 2 week(s).   Specialty: Family Medicine Why: Follow-up in 1 to 2 weeks. Contact information: [address] [place] [address] [phone]                  The results of significant  diagnostics from this hospitalization (including imaging, microbiology, ancillary and laboratory) are listed below for reference.    Significant Diagnostic Studies: CT ABDOMEN PELVIS W CONTRAST  Result Date: 01/18/2023 CLINICAL DATA:  57 year old male with altered mental status, radiographic evidence of small-bowel obstruction yesterday. EXAM: CT ABDOMEN AND PELVIS WITH CONTRAST TECHNIQUE: Multidetector CT imaging of the abdomen and pelvis was performed using the standard protocol following bolus administration of intravenous contrast. RADIATION DOSE REDUCTION: This exam was performed according to the departmental dose-optimization program which includes automated exposure control,  adjustment of the mA and/or kV according to patient size and/or use of iterative reconstruction technique. CONTRAST:  OMNIPAQUE IOHEXOL 300 MG/ML  SOLN COMPARISON:  KUB yesterday. Noncontrast CT Chest, Abdomen, and Pelvis 01/12/2023. FINDINGS: Lower chest: New layering pleural effusions, small and greater on the left. Enhancing adjacent lower lobe atelectasis. No pericardial effusion. Partially visible SVC central line. Calcified coronary artery atherosclerosis. No cardiomegaly. Hepatobiliary: Fatty liver. Contracted gallbladder. No bile duct enlargement. Pancreas: Inflammation surrounding the tail of the pancreas does not appear significantly changed. No overt pancreatic necrosis. No ductal dilatation. Spleen: Pancreatic tail inflammation at the splenic hilum, otherwise negative. Adrenals/Urinary Tract: Negative adrenal glands. Symmetric renal enhancement. Simple density left lower pole cyst (no follow-up imaging recommended). On delayed images there is symmetric renal contrast excretion. Ureters are decompressed. Bladder is decompressed. Stomach/Bowel: Decompressed rectum. Highly redundant sigmoid colon which tracks into the right upper quadrant. The sigmoid contains some fluid but is nondilated. Sigmoid diverticula. Fluid  also in nondilated descending colon. Fluid in slightly larger transverse colon, hepatic flexure. Fluid in nondilated right colon. Appendix remains normal on series 2, image 69. Fluid in decompressed terminal ileum and multiple distal small bowel loops. Decompressed stomach. Fluid in the duodenum. Decompressed proximal jejunum, but dilated fluid-filled small bowel beginning in the left abdomen. Dilated small bowel air-fluid levels continuing to the level of a left lower quadrant transition point on series 5, image 59. However, there are mildly dilated fluid-filled loops distal to that transition also. And then a gradual transition then to the terminal ileum. No pneumoperitoneum. There is a small volume of simple density free fluid in the bilateral lower quadrants, distal small bowel mesentery (coronal image 52 on the right). Vascular/Lymphatic: Major arterial structures in the abdomen and pelvis are patent. No calcified atherosclerosis or lymphadenopathy identified. Portal venous system including the splenic vein appear patent. Reproductive: Negative. Other: No pelvis free fluid.  Stable pelvic phleboliths. 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 No acute osseous abnormality identified. IMPRESSION: 1. Evidence of ongoing Acute Pancreatitis at the tail. No pancreatic necrosis, pseudocyst, or other direct complications. 2. Fluid-filled small and large bowel loops throughout the abdomen and pelvis compatible with enteritis/diarrhea. Plus abnormally dilated small bowel loops suggesting at least Partial Mechanical Small Bowel Obstruction with a transition point in the left lower quadrant (series 5, image 59). But also fluid distended loops distal to that transition, gradually tapering to the terminal ileum. 3. Small free fluid in both lower quadrants.  No pneumoperitoneum. 4. New small layering pleural effusions with atelectasis since 01/12/2023, greater on the left. Electronically Signed    By: Odessa Fleming M.D.   On: 01/18/2023 12:41   DG Abd Portable 1V  Result Date: 01/17/2023 CLINICAL DATA:  Abdominal distension. EXAM: PORTABLE ABDOMEN - 1 VIEW COMPARISON:  January 15, 2023. FINDINGS: Dilated small bowel loops are noted in the left side of the abdomen and pelvis concerning for distal small bowel obstruction or possibly ileus. No colonic dilatation is noted. No radio-opaque calculi or other significant radiographic abnormality are seen. IMPRESSION: Small bowel dilatation is noted concerning for distal small bowel obstruction or possibly ileus. Electronically Signed   By: Lupita Raider M.D.   On: 01/17/2023 13:41   US Abdomen Complete  Result Date: 01/15/2023 CLINICAL DATA:  Pancreatitis. EXAM: ABDOMEN ULTRASOUND COMPLETE COMPARISON:  None Available. FINDINGS: Gallbladder: No gallstones or wall thickening visualized (2.4 mm). No sonographic Murphy sign noted by sonographer. Common bile duct: Not clearly visualized secondary to overlying  bowel gas. Liver: No focal lesion identified. Diffusely increased echogenicity of the liver parenchyma is noted. Portal vein is patent on color Doppler imaging with normal direction of blood flow towards the liver. IVC: No abnormality visualized. Pancreas: Visualized portion unremarkable. Spleen: Size (9.1 cm) and appearance within normal limits. Right Kidney: Length: 10.2 cm. Echogenicity within normal limits. No mass or hydronephrosis visualized. Left Kidney: Length: 11.9 cm. Echogenicity within normal limits. A 2.2 cm x 1.9 cm x 2.0 cm simple cyst is seen within the left kidney. No hydronephrosis is visualized. Abdominal aorta: Not visualized. Other findings: Markedly limited study secondary to the patient's body habitus and overlying bowel gas. IMPRESSION: 1. Limited study secondary to the patient's body habitus and overlying bowel gas. 2. Hepatic steatosis. 3. Simple left renal cyst. Electronically Signed   By: Virgina Norfolk M.D.   On: 01/15/2023 21:32    Rapid EEG  Result Date: 01/15/2023 Lora Havens, MD     01/15/2023  3:11 PM Patient Name: Vincent Thompson MRN: [id number] Epilepsy Attending: Lora Havens Referring Physician/Provider: Candee Furbish, MD Date: 01/15/2023 Duration: 38 mins Patient history: 57yo M with ams. EEG to evaluate for seizure Level of alertness: lethargic AEDs during EEG study: None Technical aspects: This EEG was obtained using a 10 lead EEG system positioned circumferentially without any parasagittal coverage (rapid EEG). Computer selected EEG is reviewed as  well as background features and all clinically significant events. Description: EEG showed continuous generalized 5-8hz  theta-alpha activity admixed with intermittent generalized 2-3hz  delta slowing. Hyperventilation and photic stimulation were not performed.   ABNORMALITY - Continuous slow, generalized IMPRESSION: This limited ceribell EEG is suggestive of moderate diffuse encephalopathy, nonspecific etiology. No seizures or epileptiform discharges were seen throughout the recording. If suspicion for interictal activity remains a concern, a conventional EEG can be considered. Priyanka O Yadav   [place] EKG SITE RITE  Result Date: 01/15/2023 If [facility] image not attached, placement could not be confirmed due to current cardiac rhythm.  DG Abd 1 View  Result Date: 01/15/2023 CLINICAL DATA:  56 year old male with abdominal distension. EXAM: ABDOMEN - 1 VIEW COMPARISON:  CT Abdomen and Pelvis 01/12/2023. FINDINGS: Portable AP supine view at 0847 hours. Gas-filled but nondilated large and small bowel throughout the abdomen and pelvis. Gas in the rectum. Bowel gas has significantly increased from the recent CT. No pneumoperitoneum is evident on this supine view. Other abdominal and pelvic visceral contours appear negative. No acute osseous abnormality identified. IMPRESSION: Gas now throughout nondilated small and large bowel to the rectum. Pattern compatible with Ileus.  Electronically Signed   By: Genevie Ann M.D.   On: 01/15/2023 08:53   DG CHEST PORT 1 VIEW  Result Date: 01/15/2023 CLINICAL DATA:  Tachypnea.  DKA and AK I. EXAM: PORTABLE CHEST 1 VIEW COMPARISON:  CT chest and chest x-ray dated January 12, 2023. FINDINGS: The heart size and mediastinal contours are within normal limits. Continued low lung volumes. No focal consolidation, pleural effusion, or pneumothorax. No acute osseous abnormality. IMPRESSION: No active disease. Electronically Signed   By: Titus Dubin M.D.   On: 01/15/2023 08:36   US RENAL  Result Date: 01/13/2023 CLINICAL DATA:  Acute kidney injury EXAM: RENAL / URINARY TRACT ULTRASOUND COMPLETE COMPARISON:  CT 01/12/2023 FINDINGS: Right Kidney: Renal measurements: 10.9 x 5.5 x 6.3 cm = volume: 199 mL. Echogenicity within normal limits. No mass or hydronephrosis visualized. Left Kidney: Renal measurements: 12.3 x 6.4 x 7.3 cm = volume: 298  mL. Echogenicity within normal limits. No hydronephrosis. 3.4 x 2.8 x 2.6 cm cyst in the lower pole with mild septation. No follow-up recommended. Bladder: Appears normal for degree of bladder distention. Other: None. IMPRESSION: No hydronephrosis. Borderline mild renal enlargement which could go along with acute nephritis. Electronically Signed   By: Nelson Chimes M.D.   On: 01/13/2023 08:01   CT Head Wo Contrast  Result Date: 01/12/2023 CLINICAL DATA:  Mental status change, unknown cause EXAM: CT HEAD WITHOUT CONTRAST TECHNIQUE: Contiguous axial images were obtained from the base of the skull through the vertex without intravenous contrast. RADIATION DOSE REDUCTION: This exam was performed according to the departmental dose-optimization program which includes automated exposure control, adjustment of the mA and/or kV according to patient size and/or use of iterative reconstruction technique. COMPARISON:  None Available. FINDINGS: Brain: No evidence of acute infarction, hemorrhage, hydrocephalus, extra-axial  collection or mass lesion/mass effect. Mild patchy white matter hypodensities, nonspecific but compatible with chronic microvascular ischemic disease. Vascular: No hyperdense vessel identified. Skull: No acute fracture. Sinuses/Orbits: Mild paranasal sinus mucosal thickening. No acute orbital findings. Other: No mastoid effusions. IMPRESSION: No evidence of acute intracranial abnormality. Electronically Signed   By: Margaretha Sheffield M.D.   On: 01/12/2023 16:56   CT CHEST ABDOMEN PELVIS WO CONTRAST  Result Date: 01/12/2023 CLINICAL DATA:  Sepsis EXAM: CT CHEST, ABDOMEN AND PELVIS WITHOUT CONTRAST TECHNIQUE: Multidetector CT imaging of the chest, abdomen and pelvis was performed following the standard protocol without IV contrast. RADIATION DOSE REDUCTION: This exam was performed according to the departmental dose-optimization program which includes automated exposure control, adjustment of the mA and/or kV according to patient size and/or use of iterative reconstruction technique. COMPARISON:  None Available. FINDINGS: CT CHEST FINDINGS Cardiovascular: Calcific atherosclerosis of the coronary arteries. Normal heart size. No pericardial effusion. Mediastinum/Nodes: No enlarged mediastinal, hilar, or axillary lymph nodes. Thyroid gland, trachea, and esophagus demonstrate no significant findings. Lungs/Pleura: Lungs are clear. No pleural effusion or pneumothorax. Musculoskeletal: No chest wall mass or suspicious bone lesions identified. CT ABDOMEN PELVIS FINDINGS Hepatobiliary: Diffuse low-attenuation liver, compatible with hepatic steatosis. No visible mass lesion. Gallbladder is hyperdense but otherwise unremarkable. Pancreas: Mild stranding/edema around the pancreas, particularly the pancreatic head. Spleen: Normal in size without focal abnormality. Adrenals/Urinary Tract: Adrenal glands are unremarkable. No hydronephrosis. Approximately 2.6 cm hypoattenuating left renal lesion with Hounsfield unit measurements  that are indeterminate in areas. Stomach/Bowel: Mild thickening of the duodenum in the region the pancreas. Otherwise, unremarkable appearance of bowel loops. No dilated bowel loops to suggest obstruction. Normal appendix. Vascular/Lymphatic: No significant vascular findings are present. No enlarged abdominal or pelvic lymph nodes. Reproductive: Prostate is unremarkable. Other: Mild laxity anterior abdominal wall.  No significant ascites. Musculoskeletal: L5-S1 degenerative change. IMPRESSION: 1. Mild stranding/edema around the pancreas, concerning for pancreatitis. No discrete, drainable fluid collection. Correlate with lipase. 2. Mild thickening of the proximal duodenum in the region of the pancreas, likely secondary/reactive change. 3. Hepatic steatosis. 4. Indeterminate 2.6 cm left renal lesion. This may represent a cyst but recommend non-urgent follow-up renal ultrasound to better characterize. Electronically Signed   By: Margaretha Sheffield M.D.   On: 01/12/2023 16:55   DG Chest Port 1 View  Result Date: 01/12/2023 CLINICAL DATA:  Cough and upper respiratory infection. EXAM: PORTABLE CHEST 1 VIEW COMPARISON:  07/31/2020 FINDINGS: 1417 hours. Low volume film. The lungs are clear without focal pneumonia, edema, pneumothorax or pleural effusion. The cardiopericardial silhouette is within normal limits for size. The visualized bony  structures of the thorax are unremarkable. Telemetry leads overlie the chest. IMPRESSION: Low volume film without acute cardiopulmonary findings. Electronically Signed   By: Misty Stanley M.D.   On: 01/12/2023 14:39    Microbiology: Recent Results (from the past 240 hour(s))  Urine Culture     Status: None   Collection Time: 01/12/23  4:43 PM   Specimen: In/Out Cath Urine  Result Value Ref Range Status   Specimen Description   Final    IN/OUT CATH URINE Performed at [facility], [address] [place], [place] [address]    Special Requests   Final     NONE Performed at [facility], [address] [place], [place] [address]    Culture   Final    NO GROWTH Performed at [facility], [place] [address] [place], [place] [address]    Report Status 01/13/2023 FINAL  Final  MRSA Next Gen by PCR, Nasal     Status: None   Collection Time: 01/12/23 11:06 PM  Result Value Ref Range Status   MRSA by PCR Next Gen NOT DETECTED NOT DETECTED Final    Comment: (NOTE) The GeneXpert MRSA Assay (FDA approved for NASAL specimens only), is one component of a comprehensive MRSA colonization surveillance program. It is not intended to diagnose MRSA infection nor to guide or monitor treatment for MRSA infections. Test performance is not FDA approved in patients less than 3 years old. Performed at [facility], [place] [address] [place], [place] [address]   Respiratory (~20 pathogens) panel by PCR     Status: None   Collection Time: 01/12/23 11:52 PM   Specimen: Nasopharyngeal Swab; Respiratory  Result Value Ref Range Status   Adenovirus NOT DETECTED NOT DETECTED Final   Coronavirus 229E NOT DETECTED NOT DETECTED Final    Comment: (NOTE) The Coronavirus on the Respiratory Panel, DOES NOT test for the novel  Coronavirus (2019 nCoV)    Coronavirus HKU1 NOT DETECTED NOT DETECTED Final   Coronavirus NL63 NOT DETECTED NOT DETECTED Final   Coronavirus OC43 NOT DETECTED NOT DETECTED Final   Metapneumovirus NOT DETECTED NOT DETECTED Final   Rhinovirus / Enterovirus NOT DETECTED NOT DETECTED Final   Influenza A NOT DETECTED NOT DETECTED Final   Influenza B NOT DETECTED NOT DETECTED Final   Parainfluenza Virus 1 NOT DETECTED NOT DETECTED Final   Parainfluenza Virus 2 NOT DETECTED NOT DETECTED Final   Parainfluenza Virus 3 NOT DETECTED NOT DETECTED Final   Parainfluenza Virus 4 NOT DETECTED NOT DETECTED Final   Respiratory Syncytial Virus NOT DETECTED NOT DETECTED Final   Bordetella pertussis NOT DETECTED NOT DETECTED Final    Bordetella Parapertussis NOT DETECTED NOT DETECTED Final   Chlamydophila pneumoniae NOT DETECTED NOT DETECTED Final   Mycoplasma pneumoniae NOT DETECTED NOT DETECTED Final    Comment: Performed at [facility], [place]. [address], [place], [place] [address]     Labs: Basic Metabolic Panel: Recent Labs  Lab 01/15/23 2050 01/16/23 0009 01/18/23 0400 01/19/23 0415 01/20/23 0438 01/21/23 0413 01/22/23 0451  NA 143   < > 145 145 140 139 140  K 3.2*   < > 3.6 3.4* 3.0* 3.1* 3.4*  CL 118*   < > 116* 117* 111 109 112*  CO2 19*   < > 23 23 22 22 22   GLUCOSE 179*   < > 154* 207* 138* 102* 118*  BUN 19   < > 24*  21* 12 11 9   CREATININE 0.99   < > 0.92 0.94 0.91 0.84 0.86  CALCIUM 7.7*   < > 8.2* 7.7* 7.6* 7.7* 7.8*  MG 1.5*  --   --   --   --   --  1.7   < > = values in this interval not displayed.   Liver Function Tests: Recent Labs  Lab 01/16/23 0447 01/17/23 0431 01/18/23 0400 01/19/23 0415  AST 47* 45* 43* 35  ALT 37 35 35 32  ALKPHOS 50 55 52 50  BILITOT 1.4* 2.0* 2.3* 1.3*  PROT 6.0* 6.1* 5.9* 5.3*  ALBUMIN 2.6* 2.7* 2.8* 2.5*   No results for input(s): "LIPASE", "AMYLASE" in the last 168 hours. Recent Labs  Lab 01/16/23 0447  AMMONIA 18   CBC: Recent Labs  Lab 01/18/23 0400 01/19/23 0415 01/20/23 0438 01/21/23 0413 01/22/23 0451  WBC 17.6* 11.2* 11.6* 10.9* 9.1  HGB 9.8* 8.2* 8.4* 7.6* 7.3*  HCT 31.2* 27.0* 26.3* 24.1* 23.0*  MCV 102.3* 103.8* 101.2* 101.7* 101.3*  PLT 149* 142* 151 162 158   Cardiac Enzymes: Recent Labs  Lab 01/16/23 0447 01/17/23 0431 01/18/23 0400  CKTOTAL 746* 531* 309   BNP: BNP (last 3 results) No results for input(s): "BNP" in the last 8760 hours.  ProBNP (last 3 results) No results for input(s): "PROBNP" in the last 8760 hours.  CBG: Recent Labs  Lab 01/22/23 0023 01/22/23 0259 01/22/23 0539 01/22/23 0716 01/22/23 1201  GLUCAP 116* 108* 120* 127* 148*       Signed:  Irine Seal MD.  [facility] 01/22/2023, 3:23 PM

## 2023-01-22 NOTE — Inpatient Diabetes Management (Signed)
Inpatient Diabetes Program Recommendations  AACE/ADA: New Consensus Statement on Inpatient Glycemic Control (2015)  Target Ranges:  Prepandial:   less than 140 mg/dL      Peak postprandial:   less than 180 mg/dL (1-2 hours)      Critically ill patients:  140 - 180 mg/dL   Lab Results  Component Value Date   GLUCAP 127 (H) 01/22/2023   HGBA1C 13.7 (H) 01/13/2023    Review of Glycemic Control  Diabetes history: New DM2  Outpatient Diabetes medications: None Current orders for Inpatient glycemic control: Levemir 34 QD, Novolog 0-15 Q4H  HgbA1C - 13.7% 120, 127 mg/dL this am  Inpatient Diabetes Program Recommendations:    For discharge:  Novolin ReliOn 70/30 24 units BID  Flexpen #[id number]  Insulin pen needles #[id number]  Blood glucose meter kit #[id number]  Spoke with pt at bedside regarding going home on insulin. Pt states he would prefer 2 shots/day instead of 4. Very concerned with prices of insulin and what his copay would be. I believe he is a good candidate for Novolin ReliOn 70/30 insulin at [facility]. Have discussed price of $44 and he is okay with it. Pt will call PCP this afternoon to make a follow-up appt within a week. Instructed to take blood sugar log to appt for review.   Educated patient on insulin pen use at home. Reviewed contents of insulin flexpen starter kit. Reviewed all steps if insulin pen including attachment of needle, 2-unit air shot, dialing up dose, giving injection, removing needle, disposal of sharps, storage of unused insulin, disposal of insulin etc. Patient able to provide successful return demonstration. Also reviewed troubleshooting with insulin pen. MD to give patient Rxs for insulin pens and insulin pen needles.  Further discussed hypoglycemia s/s and treatment and pt was able to repeat this.  Very committed to keeping his blood sugars in control and reducing HgbA1C to 7%. Answered all questions. Discussed above with RN.  Thank you. Lorenda Peck,  RD, LDN, [facility] Inpatient Diabetes Coordinator [phone]
